# Patient Record
Sex: Female | Born: 1950 | Race: White | Hispanic: No | Marital: Married | Smoking: Never smoker
Health system: Southern US, Community
[De-identification: ages and names within clinical notes are randomized; demographics above are authoritative.]

## PROBLEM LIST (undated history)

## (undated) DIAGNOSIS — E785 Hyperlipidemia, unspecified: Secondary | ICD-10-CM

## (undated) DIAGNOSIS — F32A Depression, unspecified: Secondary | ICD-10-CM

## (undated) DIAGNOSIS — N1330 Unspecified hydronephrosis: Secondary | ICD-10-CM

## (undated) DIAGNOSIS — F329 Major depressive disorder, single episode, unspecified: Secondary | ICD-10-CM

## (undated) DIAGNOSIS — K219 Gastro-esophageal reflux disease without esophagitis: Secondary | ICD-10-CM

## (undated) DIAGNOSIS — R51 Headache: Secondary | ICD-10-CM

## (undated) DIAGNOSIS — N3941 Urge incontinence: Secondary | ICD-10-CM

## (undated) DIAGNOSIS — Z973 Presence of spectacles and contact lenses: Secondary | ICD-10-CM

## (undated) DIAGNOSIS — N200 Calculus of kidney: Secondary | ICD-10-CM

## (undated) DIAGNOSIS — E119 Type 2 diabetes mellitus without complications: Secondary | ICD-10-CM

## (undated) DIAGNOSIS — Z8679 Personal history of other diseases of the circulatory system: Secondary | ICD-10-CM

## (undated) DIAGNOSIS — Z87442 Personal history of urinary calculi: Secondary | ICD-10-CM

## (undated) DIAGNOSIS — F0390 Unspecified dementia without behavioral disturbance: Secondary | ICD-10-CM

## (undated) DIAGNOSIS — M1990 Unspecified osteoarthritis, unspecified site: Secondary | ICD-10-CM

## (undated) HISTORY — PX: OTHER SURGICAL HISTORY: SHX169

---

## 1998-04-15 ENCOUNTER — Other Ambulatory Visit: Admission: RE | Admit: 1998-04-15 | Discharge: 1998-04-15 | Payer: Self-pay | Admitting: Gynecology

## 1999-07-05 ENCOUNTER — Other Ambulatory Visit: Admission: RE | Admit: 1999-07-05 | Discharge: 1999-07-05 | Payer: Self-pay | Admitting: Gynecology

## 2000-09-03 ENCOUNTER — Other Ambulatory Visit: Admission: RE | Admit: 2000-09-03 | Discharge: 2000-09-03 | Payer: Self-pay | Admitting: Gynecology

## 2001-10-02 ENCOUNTER — Other Ambulatory Visit: Admission: RE | Admit: 2001-10-02 | Discharge: 2001-10-02 | Payer: Self-pay | Admitting: Gynecology

## 2002-12-29 ENCOUNTER — Other Ambulatory Visit: Admission: RE | Admit: 2002-12-29 | Discharge: 2002-12-29 | Payer: Self-pay | Admitting: Gynecology

## 2004-01-19 ENCOUNTER — Other Ambulatory Visit: Admission: RE | Admit: 2004-01-19 | Discharge: 2004-01-19 | Payer: Self-pay | Admitting: Gynecology

## 2005-08-17 ENCOUNTER — Other Ambulatory Visit: Admission: RE | Admit: 2005-08-17 | Discharge: 2005-08-17 | Payer: Self-pay | Admitting: Gynecology

## 2008-10-28 ENCOUNTER — Ambulatory Visit (HOSPITAL_BASED_OUTPATIENT_CLINIC_OR_DEPARTMENT_OTHER): Admission: RE | Admit: 2008-10-28 | Discharge: 2008-10-28 | Payer: Self-pay | Admitting: Urology

## 2008-10-28 HISTORY — PX: OTHER SURGICAL HISTORY: SHX169

## 2009-10-12 ENCOUNTER — Encounter: Admission: RE | Admit: 2009-10-12 | Discharge: 2009-10-12 | Payer: Self-pay | Admitting: Preventative Medicine

## 2009-10-26 ENCOUNTER — Encounter: Admission: RE | Admit: 2009-10-26 | Discharge: 2009-10-26 | Payer: Self-pay | Admitting: Preventative Medicine

## 2010-09-30 ENCOUNTER — Ambulatory Visit (HOSPITAL_BASED_OUTPATIENT_CLINIC_OR_DEPARTMENT_OTHER)
Admission: RE | Admit: 2010-09-30 | Discharge: 2010-09-30 | Disposition: A | Discharge status: Home / Self Care | Payer: BC Managed Care – PPO | Attending: Urology | Admitting: Urology

## 2010-09-30 DIAGNOSIS — Z01812 Encounter for preprocedural laboratory examination: Secondary | ICD-10-CM | POA: Insufficient documentation

## 2010-09-30 DIAGNOSIS — N1330 Unspecified hydronephrosis: Secondary | ICD-10-CM | POA: Insufficient documentation

## 2010-09-30 DIAGNOSIS — N201 Calculus of ureter: Secondary | ICD-10-CM | POA: Insufficient documentation

## 2010-09-30 DIAGNOSIS — N135 Crossing vessel and stricture of ureter without hydronephrosis: Secondary | ICD-10-CM | POA: Insufficient documentation

## 2010-09-30 DIAGNOSIS — Z01810 Encounter for preprocedural cardiovascular examination: Secondary | ICD-10-CM | POA: Insufficient documentation

## 2010-09-30 LAB — POCT I-STAT, CHEM 8
BUN: 12 mg/dL (ref 6–23)
Creatinine, Ser: 0.8 mg/dL (ref 0.4–1.2)
Glucose, Bld: 123 mg/dL — ABNORMAL HIGH (ref 70–99)
HCT: 42 % (ref 36.0–46.0)
Hemoglobin: 14.3 g/dL (ref 12.0–15.0)
Sodium: 141 mEq/L (ref 135–145)
TCO2: 22 mmol/L (ref 0–100)

## 2010-10-04 NOTE — Op Note (Addendum)
  NAMEJESSICCA, [name] NO.:  [id number]  MEDICAL RECORD NO.:  [id number]          PATIENT TYPE:  AMB  LOCATION:  [facility]                         FACILITY:  [facility]  PHYSICIAN:  Maretta Bees. Vonita Moss, M.D.DATE OF BIRTH:  1950-11-28  DATE OF PROCEDURE:  09/30/2010 DATE OF DISCHARGE:                              OPERATIVE REPORT   PREOPERATIVE DIAGNOSES:  Right ureteral stones, right ureteral stricture, and marked right hydronephrosis.  POSTOPERATIVE DIAGNOSES:  Right ureteral stones, right ureteral stricture, and marked right hydronephrosis.  PROCEDURE: 1. Cystoscopy. 2. Right ureteral dilation. 3. Right ureteroscopy. 4. Laser fragmentation of stones. 5. Right retrograde pyelogram with interpretation. 6. Right double-J catheter placement.  SURGEON:  Maretta Bees. Vonita Moss, M.D.  ANESTHESIA:  General.  INDICATIONS:  This lady has had a long history of stone disease and has had previous ureteroscopic and endoscopic therapy for stones.  She is brought to OR today because CT scan found when she had grossly bloody urine demonstrated significant hydronephrosis and 3 mm to 5 mm stone in the bony pelvic region just above the hip joint.  When I first saw her in 2009, diagnosed a right ureteral stricture.  This has complicated her past stone disease.  DESCRIPTION OF PROCEDURE:  The patient was brought to the operating room and placed in lithotomy position.  External genitalia prepped and draped in usual fashion.  She was cystoscoped and the bladder was unremarkable. A sensor guidewire easily traversed the right ureter to the renal collecting system.  I then inserted a 6-French rigid ureteroscope and there was a true anatomic stricture just below the stone that required dilation with the ureteral dilating access sheath.  I was able to reach the stone and utilizing the holmium laser fragmented the stone into several tiny pieces and also broke up the 2 tiny stones that were  noted above the obstructing stone on CT scan.  The fragments were too small to collect and basket.  I ureteroscoped the proximal ureter and saw no other obvious stones.  At this point through the ureteroscope, I injected contrast and demonstrated a markedly dilated pyelocaliceal system.  I then measured the ureteral length with an open-ended ureteral catheter and elected to insert a 6-French 28-cm contour double-J catheter with a full coil in the mid calix and full coil in the bladder.  The stent was left in without a string.  At this point, the bladder was emptied and the scope removed.  There did not appear to be any significant bleeding and she tolerated the procedure well and was taken to recovery room in good condition.     Maretta Bees. Vonita Moss, M.D.     LJP/MEDQ  D:  09/30/2010  T:  09/30/2010  Job:  [id number]  cc:   Randye Lobo, M.D. Fax: [phone]  Electronically Signed by Larey Dresser M.D. on 10/04/2010 04:53:38 PM

## 2010-11-11 ENCOUNTER — Other Ambulatory Visit (HOSPITAL_COMMUNITY): Payer: Self-pay | Admitting: Urology

## 2010-11-11 DIAGNOSIS — N1330 Unspecified hydronephrosis: Secondary | ICD-10-CM

## 2010-11-15 ENCOUNTER — Encounter (HOSPITAL_COMMUNITY): Payer: Self-pay

## 2010-11-15 ENCOUNTER — Encounter (HOSPITAL_COMMUNITY)
Admission: RE | Admit: 2010-11-15 | Discharge: 2010-11-15 | Disposition: A | Discharge status: Home / Self Care | Payer: BC Managed Care – PPO | Source: Ambulatory Visit | Attending: Urology | Admitting: Urology

## 2010-11-15 DIAGNOSIS — N1330 Unspecified hydronephrosis: Secondary | ICD-10-CM | POA: Insufficient documentation

## 2010-11-15 MED ORDER — FUROSEMIDE 10 MG/ML IJ SOLN: 42.5000 mg | Freq: Once | INTRAMUSCULAR | Status: DC

## 2010-11-15 MED ORDER — TECHNETIUM TC 99M MERTIATIDE
14.3000 | Freq: Once | INTRAVENOUS | Status: AC | PRN
  Administered 2010-11-15: 14.3 via INTRAVENOUS

## 2010-11-29 ENCOUNTER — Ambulatory Visit (HOSPITAL_BASED_OUTPATIENT_CLINIC_OR_DEPARTMENT_OTHER)
Admission: RE | Admit: 2010-11-29 | Discharge: 2010-11-29 | Disposition: A | Discharge status: Home / Self Care | Payer: BC Managed Care – PPO | Source: Ambulatory Visit | Attending: Urology | Admitting: Urology

## 2010-11-29 DIAGNOSIS — N1330 Unspecified hydronephrosis: Secondary | ICD-10-CM | POA: Insufficient documentation

## 2010-11-29 DIAGNOSIS — N201 Calculus of ureter: Secondary | ICD-10-CM | POA: Insufficient documentation

## 2010-11-29 DIAGNOSIS — E119 Type 2 diabetes mellitus without complications: Secondary | ICD-10-CM | POA: Insufficient documentation

## 2010-11-29 DIAGNOSIS — Z79899 Other long term (current) drug therapy: Secondary | ICD-10-CM | POA: Insufficient documentation

## 2010-11-29 DIAGNOSIS — N135 Crossing vessel and stricture of ureter without hydronephrosis: Secondary | ICD-10-CM | POA: Insufficient documentation

## 2010-11-29 DIAGNOSIS — K219 Gastro-esophageal reflux disease without esophagitis: Secondary | ICD-10-CM | POA: Insufficient documentation

## 2010-11-29 DIAGNOSIS — Z01812 Encounter for preprocedural laboratory examination: Secondary | ICD-10-CM | POA: Insufficient documentation

## 2010-11-29 DIAGNOSIS — E7800 Pure hypercholesterolemia, unspecified: Secondary | ICD-10-CM | POA: Insufficient documentation

## 2010-11-29 HISTORY — PX: OTHER SURGICAL HISTORY: SHX169

## 2010-11-29 LAB — POCT I-STAT 4, (NA,K, GLUC, HGB,HCT)
HCT: 40 % (ref 36.0–46.0)
Sodium: 143 mEq/L (ref 135–145)

## 2010-12-07 NOTE — Op Note (Signed)
[name], DAUS NO.:  [id number]  MEDICAL RECORD NO.:  [id number]          PATIENT TYPE:  LOCATION:                                 FACILITY:  PHYSICIAN:  Maretta Bees. Vonita Moss, M.D.DATE OF BIRTH:  February 11, 1951  DATE OF PROCEDURE:  11/29/2010 DATE OF DISCHARGE:                              OPERATIVE REPORT   PREOPERATIVE DIAGNOSES:  Right ureteral stricture, right hydronephrosis, and rule out right renal stones.  POSTOPERATIVE DIAGNOSES:  Right ureteral stricture, right ureteral stone, and right hydronephrosis.  PROCEDURES:  Cystoscopy, right ureteroscopy with stone basketing, balloon dilation of right ureteral stricture, and placement of right double-J catheter.  SURGEON:  Maretta Bees. Vonita Moss, M.D.  ANESTHESIA:  General.  INDICATIONS:  This 60 year old lady has had long history of right ureteral stone disease.  She had a several month period from July 2010 until the next year where she had what was probably silent obstruction from 5 mm stone.  She has undergone prior ureteroscopies and stent placements.  She comes to the OR now with marked right hydronephrosis and question of residual stones.  She was advised about  risk of recurrent stricture and the need for lengthy double-J catheter drainage.  DESCRIPTION OF PROCEDURE:  The patient was brought to the operating room and placed in lithotomy position.  The external genitalia prepped and draped in usual fashion.  She was cystoscoped and the bladder was unremarkable.  She had some trigonitis.  I inserted a retractable core guidewire up the right ureter and it will only go about 3 cm.  I then injected contrast and she had a normal distal right ureter.  No contrast was seen to go above the 3 cm level.  I then inserted a 6- French rigid ureteroscope and she had a very pin-point opening in the ureter.  I then used the Glidewire and fortunately was able to manipulate it up into the kidney.  I then inserted  an open-ended ureteral catheter that obviously went through a tight strictured area in the lower ureter before it entered the pyelocaliceal system.  At this time, I injected contrast and documented the marked pyelocaliceal dilation.  I then inserted the rigid short 6-French ureteroscope and just above the stricture I visualized the stone which I was able to basket, though it was somewhat imbedded.  I retrieved the stone intact. After that, I serially dilated the lower ureter using the 4 cm long 10- French balloon dilating catheter.  I then inserted the rigid ureteroscope and was easily able to traverse the strictured area which started about 3 cm above the left ureteral orifice and probably was 5 cm long.  Above this, there was a dilated but normal looking ureter.  I got all the way up to the kidney with the rigid scope and then with another guidewire in place, inserted ureteral access sheath.  I then used the digital scope to visualize the calyces, the kidney, and saw no other stones.  At this point, I inserted a 6-French 28-cm Contura catheter with full curl in the renal pelvis and full curl in the bladder.  String was removed.  The bladder was emptied, scope removed, and the patient sent to recovery room in good condition, having tolerated the procedure well.     Maretta Bees. Vonita Moss, M.D.     LJP/MEDQ  D:  11/29/2010  T:  11/29/2010  Job:  [id number]  Electronically Signed by Larey Dresser M.D. on 12/07/2010 11:06:12 AM

## 2010-12-08 LAB — POCT I-STAT, CHEM 8
BUN: 8 mg/dL (ref 6–23)
Chloride: 108 mEq/L (ref 96–112)
Glucose, Bld: 109 mg/dL — ABNORMAL HIGH (ref 70–99)

## 2011-01-10 NOTE — Op Note (Signed)
NAMEJOSCELYNE, RENVILLE NO.:  [id number]   MEDICAL RECORD NO.:  [id number]          PATIENT TYPE:  AMB   LOCATION:  [facility]                         FACILITY:  [facility]   PHYSICIAN:  Maretta Bees. Vonita Moss, M.D.DATE OF BIRTH:  [birth year]   DATE OF PROCEDURE:  10/28/2008  DATE OF DISCHARGE:                               OPERATIVE REPORT   PREOPERATIVE DIAGNOSIS:  Distal right ureteral stone.   POSTOPERATIVE DIAGNOSES:  Distal right ureteral stone plus right  hydronephrosis and right ureteral stricture.   PROCEDURE:  Cystoscopy, right ureteroscopy, dilation of right ureteral  stricture, right retrograde pyelogram with interpretation, and insertion  of right double-J catheter.   SURGEON:  Dr. Larey Dresser   ANESTHESIA:  General.   INDICATIONS:  This lady has had the onset of right flank pain in  December due to two ureteral stones and she underwent cystoscopy and  ureteroscopy and laser therapy at [facility] in December.  She  has had some retained stones and in mid-January a CT scan showed some  persistent mid-ureteral calculi.  She is still having intermittent right  flank pain.  KUB in the office the other day showed what appeared to be  a stone in the distal right ureter.  She is brought the OR today for  further evaluation and treatment.   PROCEDURE:  The patient was brought to the operating room and placed in  lithotomy position.  External genitalia were prepped and draped in usual  fashion.  She was cystoscoped and the only abnormality was an edematous  right ureteral orifice.  I saw no stone in the bladder.  I then passed a  retractable core guidewire with the core retracted up the right ureter  without any difficulty.  I then used the short 6-French rigid  ureteroscope and scoped the distal ureter and I saw no stone, but there  was an impassable stricture just below the pelvic brim.  I then used the  inner core of a ureteral access sheath and encountered  some resistance  at this level of the stricture until the inner core slid through and  this was left in place for 2 minutes.  After that, with the guidewire  still in place, I inserted the 6-French rigid scope and easily traversed  this dilated stricture and was able to scope the ureter all the way up  to the renal pelvis and I did not see any stone.  It is unclear whether  she had passed the stone and did not notice it or whether I may have  flushed the stone back up into the renal pelvis and it was not  observable at this time.   Looking back down the ureter, there was some mild bleeding at the site  of the dilation of the stricture, but there was no evidence of ureteral  disruption or undue trauma.   With the guidewire in place that was inserted cystoscopically, I  inserted a 5-French open-ended ureteral catheter and performed a right  retrograde pyelogram that showed moderate pyelocaliectasis.   In view of the need to dilate the  strictured area in the distal ureter  and the moderate hydronephrosis, I thought it best to leave up a double-  J catheter and actually I selected a 6-French 28-cm Polaris.  This was  inserted without difficulty  with a full coil in the renal collecting system and the multiple loops  brought out per the right ureteral orifice.  The string had been  previously removed.  At this point, the bladder was re-scoped and  emptied and the patient sent to recovery room in good condition.      Maretta Bees. Vonita Moss, M.D.  Electronically Signed     LJP/MEDQ  D:  10/28/2008  T:  10/28/2008  Job:  [id number]

## 2011-02-20 ENCOUNTER — Other Ambulatory Visit (HOSPITAL_COMMUNITY): Payer: Self-pay | Admitting: Urology

## 2011-02-20 DIAGNOSIS — N1330 Unspecified hydronephrosis: Secondary | ICD-10-CM

## 2011-02-21 ENCOUNTER — Other Ambulatory Visit (HOSPITAL_COMMUNITY): Payer: BC Managed Care – PPO

## 2011-02-24 ENCOUNTER — Encounter (HOSPITAL_COMMUNITY): Payer: Self-pay

## 2011-02-24 ENCOUNTER — Encounter (HOSPITAL_COMMUNITY)
Admission: RE | Admit: 2011-02-24 | Discharge: 2011-02-24 | Disposition: A | Discharge status: Home / Self Care | Payer: BC Managed Care – PPO | Source: Ambulatory Visit | Attending: Urology | Admitting: Urology

## 2011-02-24 DIAGNOSIS — N1330 Unspecified hydronephrosis: Secondary | ICD-10-CM | POA: Insufficient documentation

## 2011-02-24 DIAGNOSIS — N289 Disorder of kidney and ureter, unspecified: Secondary | ICD-10-CM | POA: Insufficient documentation

## 2011-02-24 MED ORDER — TECHNETIUM TC 99M MERTIATIDE
15.8000 | Freq: Once | INTRAVENOUS | Status: AC | PRN
  Administered 2011-02-24: 15.8 via INTRAVENOUS

## 2011-04-04 ENCOUNTER — Ambulatory Visit (INDEPENDENT_AMBULATORY_CARE_PROVIDER_SITE_OTHER): Payer: BC Managed Care – PPO | Admitting: Urology

## 2011-04-04 DIAGNOSIS — N135 Crossing vessel and stricture of ureter without hydronephrosis: Secondary | ICD-10-CM

## 2011-04-04 DIAGNOSIS — R310 Gross hematuria: Secondary | ICD-10-CM

## 2011-04-04 DIAGNOSIS — N200 Calculus of kidney: Secondary | ICD-10-CM

## 2011-04-05 ENCOUNTER — Ambulatory Visit (HOSPITAL_COMMUNITY)
Admission: RE | Admit: 2011-04-05 | Discharge: 2011-04-05 | Disposition: A | Discharge status: Home / Self Care | Payer: BC Managed Care – PPO | Source: Ambulatory Visit | Attending: Urology | Admitting: Urology

## 2011-04-05 ENCOUNTER — Other Ambulatory Visit: Payer: Self-pay | Admitting: Urology

## 2011-04-05 DIAGNOSIS — R109 Unspecified abdominal pain: Secondary | ICD-10-CM | POA: Insufficient documentation

## 2011-04-05 DIAGNOSIS — N200 Calculus of kidney: Secondary | ICD-10-CM | POA: Insufficient documentation

## 2011-04-17 ENCOUNTER — Ambulatory Visit (HOSPITAL_COMMUNITY)
Admission: RE | Admit: 2011-04-17 | Discharge: 2011-04-17 | Disposition: A | Discharge status: Home / Self Care | Payer: BC Managed Care – PPO | Source: Ambulatory Visit | Attending: Urology | Admitting: Urology

## 2011-04-17 DIAGNOSIS — E119 Type 2 diabetes mellitus without complications: Secondary | ICD-10-CM | POA: Insufficient documentation

## 2011-04-17 DIAGNOSIS — I10 Essential (primary) hypertension: Secondary | ICD-10-CM | POA: Insufficient documentation

## 2011-04-17 DIAGNOSIS — K219 Gastro-esophageal reflux disease without esophagitis: Secondary | ICD-10-CM | POA: Insufficient documentation

## 2011-04-17 DIAGNOSIS — K449 Diaphragmatic hernia without obstruction or gangrene: Secondary | ICD-10-CM | POA: Insufficient documentation

## 2011-04-17 DIAGNOSIS — E7800 Pure hypercholesterolemia, unspecified: Secondary | ICD-10-CM | POA: Insufficient documentation

## 2011-04-17 DIAGNOSIS — N200 Calculus of kidney: Secondary | ICD-10-CM | POA: Insufficient documentation

## 2011-04-17 HISTORY — PX: EXTRACORPOREAL SHOCK WAVE LITHOTRIPSY: SHX1557

## 2011-04-17 LAB — GLUCOSE, CAPILLARY: Glucose-Capillary: 121 mg/dL — ABNORMAL HIGH (ref 70–99)

## 2011-05-08 ENCOUNTER — Ambulatory Visit (HOSPITAL_COMMUNITY)
Admission: RE | Admit: 2011-05-08 | Discharge: 2011-05-08 | Disposition: A | Discharge status: Home / Self Care | Payer: BC Managed Care – PPO | Source: Ambulatory Visit | Attending: Urology | Admitting: Urology

## 2011-05-08 ENCOUNTER — Other Ambulatory Visit: Payer: Self-pay | Admitting: Urology

## 2011-05-08 DIAGNOSIS — N200 Calculus of kidney: Secondary | ICD-10-CM

## 2011-05-08 DIAGNOSIS — Z09 Encounter for follow-up examination after completed treatment for conditions other than malignant neoplasm: Secondary | ICD-10-CM | POA: Insufficient documentation

## 2011-05-09 ENCOUNTER — Ambulatory Visit: Payer: BC Managed Care – PPO | Admitting: Urology

## 2011-06-06 ENCOUNTER — Other Ambulatory Visit: Payer: Self-pay | Admitting: Obstetrics and Gynecology

## 2011-07-02 ENCOUNTER — Encounter (HOSPITAL_COMMUNITY): Payer: Self-pay

## 2011-07-05 NOTE — Patient Instructions (Addendum)
   Your procedure is scheduled WU:[id number] November 15th  Enter through the Main Entrance of [facility] at:6am Pick up the phone at the desk and dial [phone] and inform us of your arrival.  Please call this number if you have any problems the morning of surgery: [phone]  Remember: Do not eat food after midnight:Wednesday Do not drink clear liquids after:midnight Wednesday Take these medicines the morning of surgery with a SIP OF WATER:hold metformin morning of surgery, please take your nexium, effexor, and b/p medicine morning of surgery with sips of water  Do not wear jewelry, make-up, or FINGER nail polish Do not wear lotions, powders, or perfumes.  You may not  wear deodorant. Do not shave 48 hours prior to surgery. Do not bring valuables to the hospital.  Leave suitcase in the car. After Surgery it may be brought to your room. For patients being admitted to the hospital, checkout time is 11:00am the day of discharge.     Remember to use your hibiclens as instructed.Please shower with 1/2 bottle the evening before your surgery and the other 1/2 bottle the morning of surgery.

## 2011-07-06 ENCOUNTER — Encounter (HOSPITAL_COMMUNITY): Payer: Self-pay

## 2011-07-06 ENCOUNTER — Encounter (HOSPITAL_COMMUNITY)
Admission: RE | Admit: 2011-07-06 | Discharge: 2011-07-06 | Disposition: A | Discharge status: Home / Self Care | Payer: BC Managed Care – PPO | Source: Ambulatory Visit | Attending: Obstetrics and Gynecology | Admitting: Obstetrics and Gynecology

## 2011-07-06 HISTORY — DX: Headache: R51

## 2011-07-06 HISTORY — DX: Gastro-esophageal reflux disease without esophagitis: K21.9

## 2011-07-06 HISTORY — DX: Unspecified osteoarthritis, unspecified site: M19.90

## 2011-07-06 HISTORY — DX: Major depressive disorder, single episode, unspecified: F32.9

## 2011-07-06 HISTORY — DX: Hyperlipidemia, unspecified: E78.5

## 2011-07-06 HISTORY — DX: Depression, unspecified: F32.A

## 2011-07-06 LAB — COMPREHENSIVE METABOLIC PANEL
BUN: 8 mg/dL (ref 6–23)
Calcium: 10.1 mg/dL (ref 8.4–10.5)
GFR calc Af Amer: >90 mL/min (ref >90)
Glucose, Bld: 94 mg/dL (ref 70–99)
Total Protein: 7.1 g/dL (ref 6.0–8.3)

## 2011-07-06 LAB — URINALYSIS, ROUTINE W REFLEX MICROSCOPIC
Ketones, ur: NEGATIVE mg/dL
Leukocytes, UA: NEGATIVE
Nitrite: NEGATIVE
Specific Gravity, Urine: >1.03 — ABNORMAL HIGH (ref 1.005–1.030)
pH: 6 (ref 5.0–8.0)

## 2011-07-06 LAB — CBC
HCT: 40 % (ref 36.0–46.0)
Hemoglobin: 12.9 g/dL (ref 12.0–15.0)
MCH: 27.2 pg (ref 26.0–34.0)
MCHC: 32.3 g/dL (ref 30.0–36.0)

## 2011-07-06 LAB — SURGICAL PCR SCREEN: MRSA, PCR: NEGATIVE

## 2011-07-06 NOTE — Pre-Procedure Instructions (Addendum)
EKG from pt primary care Dr Carlynn Spry [facility]-SR normal limits-on chart

## 2011-07-12 MED ORDER — DEXTROSE 5 % IV SOLN
2.0000 g | INTRAVENOUS | Status: AC
  Administered 2011-07-13: 2 g via INTRAVENOUS
  Filled 2011-07-12: qty 2

## 2011-07-13 ENCOUNTER — Ambulatory Visit (HOSPITAL_COMMUNITY): Payer: BC Managed Care – PPO | Admitting: Anesthesiology

## 2011-07-13 ENCOUNTER — Ambulatory Visit (HOSPITAL_COMMUNITY)
Admission: RE | Admit: 2011-07-13 | Discharge: 2011-07-14 | DRG: 356 | Disposition: A | Discharge status: Home / Self Care | Payer: BC Managed Care – PPO | Source: Ambulatory Visit | Attending: Obstetrics and Gynecology | Admitting: Obstetrics and Gynecology

## 2011-07-13 ENCOUNTER — Encounter (HOSPITAL_COMMUNITY)
Admission: RE | Disposition: A | Discharge status: Home / Self Care | Payer: Self-pay | Source: Ambulatory Visit | Attending: Obstetrics and Gynecology

## 2011-07-13 ENCOUNTER — Encounter (HOSPITAL_COMMUNITY): Payer: Self-pay | Admitting: Anesthesiology

## 2011-07-13 ENCOUNTER — Other Ambulatory Visit: Payer: Self-pay | Admitting: Obstetrics and Gynecology

## 2011-07-13 ENCOUNTER — Encounter (HOSPITAL_COMMUNITY): Payer: Self-pay | Admitting: *Deleted

## 2011-07-13 DIAGNOSIS — D251 Intramural leiomyoma of uterus: Secondary | ICD-10-CM | POA: Diagnosis present

## 2011-07-13 DIAGNOSIS — I10 Essential (primary) hypertension: Secondary | ICD-10-CM | POA: Diagnosis present

## 2011-07-13 DIAGNOSIS — E119 Type 2 diabetes mellitus without complications: Secondary | ICD-10-CM | POA: Diagnosis present

## 2011-07-13 DIAGNOSIS — N812 Incomplete uterovaginal prolapse: Secondary | ICD-10-CM | POA: Diagnosis present

## 2011-07-13 DIAGNOSIS — N393 Stress incontinence (female) (male): Secondary | ICD-10-CM | POA: Diagnosis present

## 2011-07-13 DIAGNOSIS — N8000 Endometriosis of the uterus, unspecified: Secondary | ICD-10-CM | POA: Diagnosis present

## 2011-07-13 HISTORY — PX: LAPAROSCOPIC ASSISTED VAGINAL HYSTERECTOMY: SHX5398

## 2011-07-13 HISTORY — PX: CYSTOSCOPY: SHX5120

## 2011-07-13 HISTORY — PX: SALPINGOOPHORECTOMY: SHX82

## 2011-07-13 HISTORY — PX: BLADDER SUSPENSION: SHX72

## 2011-07-13 HISTORY — PX: ANTERIOR AND POSTERIOR REPAIR: SHX5121

## 2011-07-13 LAB — GLUCOSE, CAPILLARY: Glucose-Capillary: 132 mg/dL — ABNORMAL HIGH (ref 70–99)

## 2011-07-13 SURGERY — HYSTERECTOMY, VAGINAL, LAPAROSCOPY-ASSISTED
Anesthesia: Choice | Site: Bladder

## 2011-07-13 MED ORDER — BUPIVACAINE HCL (PF) 0.25 % IJ SOLN
INTRAMUSCULAR | Status: DC | PRN
  Administered 2011-07-13: 10 mL

## 2011-07-13 MED ORDER — MENTHOL 3 MG MT LOZG: 1.0000 | LOZENGE | OROMUCOSAL | Status: DC | PRN

## 2011-07-13 MED ORDER — STERILE WATER FOR IRRIGATION IR SOLN
Status: DC | PRN
  Administered 2011-07-13: 1000 mL via INTRAVESICAL

## 2011-07-13 MED ORDER — INDIGOTINDISULFONATE SODIUM 8 MG/ML IJ SOLN
INTRAMUSCULAR | Status: DC | PRN
  Administered 2011-07-13: 5 mL via INTRAVENOUS

## 2011-07-13 MED ORDER — NEOSTIGMINE METHYLSULFATE 1 MG/ML IJ SOLN
INTRAMUSCULAR | Status: DC | PRN
  Administered 2011-07-13: 3 mg via INTRAVENOUS

## 2011-07-13 MED ORDER — ROCURONIUM BROMIDE 50 MG/5ML IV SOLN
INTRAVENOUS | Status: AC
  Filled 2011-07-13: qty 1

## 2011-07-13 MED ORDER — NALOXONE HCL 0.4 MG/ML IJ SOLN: 0.4000 mg | INTRAMUSCULAR | Status: DC | PRN

## 2011-07-13 MED ORDER — HYDROMORPHONE HCL PF 1 MG/ML IJ SOLN: 0.2500 mg | INTRAMUSCULAR | Status: DC | PRN

## 2011-07-13 MED ORDER — ONDANSETRON HCL 4 MG/2ML IJ SOLN
INTRAMUSCULAR | Status: DC | PRN
  Administered 2011-07-13: 4 mg via INTRAVENOUS

## 2011-07-13 MED ORDER — SODIUM CHLORIDE 0.9 % IJ SOLN: 9.0000 mL | INTRAMUSCULAR | Status: DC | PRN

## 2011-07-13 MED ORDER — DIPHENHYDRAMINE HCL 50 MG/ML IJ SOLN: 12.5000 mg | Freq: Four times a day (QID) | INTRAMUSCULAR | Status: DC | PRN

## 2011-07-13 MED ORDER — METOCLOPRAMIDE HCL 5 MG/ML IJ SOLN
10.0000 mg | Freq: Once | INTRAMUSCULAR | Status: AC
  Administered 2011-07-13: 10 mg via INTRAVENOUS

## 2011-07-13 MED ORDER — LIDOCAINE HCL (CARDIAC) 20 MG/ML IV SOLN
INTRAVENOUS | Status: DC | PRN
  Administered 2011-07-13: 80 mg via INTRAVENOUS

## 2011-07-13 MED ORDER — MORPHINE SULFATE (PF) 1 MG/ML IV SOLN
INTRAVENOUS | Status: DC
  Administered 2011-07-13: 7 mg via INTRAVENOUS
  Administered 2011-07-13: 15:00:00 via INTRAVENOUS
  Administered 2011-07-13: 4 mL via INTRAVENOUS
  Administered 2011-07-14: 2 mg via INTRAVENOUS
  Administered 2011-07-14: 1 mg via INTRAVENOUS
  Filled 2011-07-13: qty 25

## 2011-07-13 MED ORDER — ONDANSETRON HCL 4 MG/2ML IJ SOLN
INTRAMUSCULAR | Status: AC
  Filled 2011-07-13: qty 2

## 2011-07-13 MED ORDER — GLYCOPYRROLATE 0.2 MG/ML IJ SOLN
INTRAMUSCULAR | Status: DC | PRN
  Administered 2011-07-13: .6 mg via INTRAVENOUS

## 2011-07-13 MED ORDER — HYDROMORPHONE HCL PF 1 MG/ML IJ SOLN
INTRAMUSCULAR | Status: AC
  Filled 2011-07-13: qty 1

## 2011-07-13 MED ORDER — METOCLOPRAMIDE HCL 5 MG/ML IJ SOLN
INTRAMUSCULAR | Status: AC
  Administered 2011-07-13: 10 mg via INTRAVENOUS
  Filled 2011-07-13: qty 2

## 2011-07-13 MED ORDER — ONDANSETRON HCL 4 MG/2ML IJ SOLN: 4.0000 mg | Freq: Four times a day (QID) | INTRAMUSCULAR | Status: DC | PRN

## 2011-07-13 MED ORDER — KETOROLAC TROMETHAMINE 30 MG/ML IJ SOLN
INTRAMUSCULAR | Status: AC
  Filled 2011-07-13: qty 1

## 2011-07-13 MED ORDER — IBUPROFEN 600 MG PO TABS
600.0000 mg | ORAL_TABLET | Freq: Four times a day (QID) | ORAL | Status: DC | PRN
  Administered 2011-07-14: 600 mg via ORAL
  Filled 2011-07-13: qty 1

## 2011-07-13 MED ORDER — MIDAZOLAM HCL 2 MG/2ML IJ SOLN
INTRAMUSCULAR | Status: AC
  Filled 2011-07-13: qty 2

## 2011-07-13 MED ORDER — ONDANSETRON HCL 4 MG/2ML IJ SOLN
4.0000 mg | Freq: Once | INTRAMUSCULAR | Status: AC
  Administered 2011-07-13: 4 mg via INTRAVENOUS

## 2011-07-13 MED ORDER — DOCUSATE SODIUM 100 MG PO CAPS
100.0000 mg | ORAL_CAPSULE | Freq: Every day | ORAL | Status: DC
  Administered 2011-07-14: 100 mg via ORAL
  Filled 2011-07-13 (×3): qty 1

## 2011-07-13 MED ORDER — PROPOFOL 10 MG/ML IV EMUL
INTRAVENOUS | Status: AC
  Filled 2011-07-13: qty 20

## 2011-07-13 MED ORDER — FENTANYL CITRATE 0.05 MG/ML IJ SOLN
INTRAMUSCULAR | Status: DC | PRN
  Administered 2011-07-13 (×3): 50 ug via INTRAVENOUS
  Administered 2011-07-13: 100 ug via INTRAVENOUS
  Administered 2011-07-13: 50 ug via INTRAVENOUS

## 2011-07-13 MED ORDER — FENTANYL CITRATE 0.05 MG/ML IJ SOLN
INTRAMUSCULAR | Status: AC
  Filled 2011-07-13: qty 5

## 2011-07-13 MED ORDER — KETOROLAC TROMETHAMINE 30 MG/ML IJ SOLN: 15.0000 mg | Freq: Once | INTRAMUSCULAR | Status: DC | PRN

## 2011-07-13 MED ORDER — HYDROMORPHONE HCL PF 1 MG/ML IJ SOLN
INTRAMUSCULAR | Status: DC | PRN
Start: 2011-07-13 — End: 2011-07-13
  Administered 2011-07-13: 1 mg via INTRAVENOUS

## 2011-07-13 MED ORDER — TEMAZEPAM 15 MG PO CAPS: 15.0000 mg | ORAL_CAPSULE | Freq: Every evening | ORAL | Status: DC | PRN

## 2011-07-13 MED ORDER — LACTATED RINGERS IV SOLN
INTRAVENOUS | Status: DC
  Administered 2011-07-13: 07:00:00 via INTRAVENOUS
  Administered 2011-07-13: 1000 mL via INTRAVENOUS
  Administered 2011-07-13 (×4): via INTRAVENOUS

## 2011-07-13 MED ORDER — FENTANYL CITRATE 0.05 MG/ML IJ SOLN
INTRAMUSCULAR | Status: AC
  Filled 2011-07-13: qty 2

## 2011-07-13 MED ORDER — LACTATED RINGERS IR SOLN
Status: DC | PRN
  Administered 2011-07-13: 3000 mL

## 2011-07-13 MED ORDER — DIPHENHYDRAMINE HCL 12.5 MG/5ML PO ELIX: 12.5000 mg | ORAL_SOLUTION | Freq: Four times a day (QID) | ORAL | Status: DC | PRN

## 2011-07-13 MED ORDER — KETOROLAC TROMETHAMINE 30 MG/ML IJ SOLN
INTRAMUSCULAR | Status: DC | PRN
  Administered 2011-07-13: 30 mg via INTRAVENOUS

## 2011-07-13 MED ORDER — LIDOCAINE-EPINEPHRINE 1 %-1:100000 IJ SOLN
INTRAMUSCULAR | Status: DC | PRN
  Administered 2011-07-13: 20 mL

## 2011-07-13 MED ORDER — PROPOFOL 10 MG/ML IV EMUL
INTRAVENOUS | Status: DC | PRN
  Administered 2011-07-13: 180 mg via INTRAVENOUS

## 2011-07-13 MED ORDER — MIDAZOLAM HCL 5 MG/5ML IJ SOLN
INTRAMUSCULAR | Status: DC | PRN
  Administered 2011-07-13: 2 mg via INTRAVENOUS

## 2011-07-13 MED ORDER — ROCURONIUM BROMIDE 100 MG/10ML IV SOLN
INTRAVENOUS | Status: DC | PRN
  Administered 2011-07-13: 5 mg via INTRAVENOUS
  Administered 2011-07-13: 45 mg via INTRAVENOUS
  Administered 2011-07-13 (×3): 10 mg via INTRAVENOUS

## 2011-07-13 MED ORDER — OXYCODONE-ACETAMINOPHEN 5-325 MG PO TABS: 1.0000 | ORAL_TABLET | ORAL | Status: DC | PRN

## 2011-07-13 MED ORDER — SIMETHICONE 80 MG PO CHEW: 80.0000 mg | CHEWABLE_TABLET | Freq: Four times a day (QID) | ORAL | Status: DC | PRN

## 2011-07-13 MED ORDER — LACTATED RINGERS IV SOLN
INTRAVENOUS | Status: DC
  Administered 2011-07-13 – 2011-07-14 (×3): via INTRAVENOUS

## 2011-07-13 MED ORDER — INSULIN ASPART 100 UNIT/ML ~~LOC~~ SOLN
0.0000 [IU] | SUBCUTANEOUS | Status: DC
  Administered 2011-07-13 – 2011-07-14 (×5): 1 [IU] via SUBCUTANEOUS
  Filled 2011-07-13: qty 3

## 2011-07-13 MED ORDER — GLYCOPYRROLATE 0.2 MG/ML IJ SOLN
INTRAMUSCULAR | Status: AC
  Filled 2011-07-13: qty 2

## 2011-07-13 MED ORDER — ESTRADIOL 0.1 MG/GM VA CREA
TOPICAL_CREAM | VAGINAL | Status: DC | PRN
  Administered 2011-07-13: 1 via VAGINAL

## 2011-07-13 MED ORDER — NEOSTIGMINE METHYLSULFATE 1 MG/ML IJ SOLN
INTRAMUSCULAR | Status: AC
  Filled 2011-07-13: qty 10

## 2011-07-13 MED ORDER — LIDOCAINE HCL (CARDIAC) 20 MG/ML IV SOLN
INTRAVENOUS | Status: AC
  Filled 2011-07-13: qty 5

## 2011-07-13 SURGICAL SUPPLY — 48 items
BAG URINE DRAINAGE (UROLOGICAL SUPPLIES) ×4 IMPLANT
BLADE SURG 15 STRL LF C SS BP (BLADE) ×3 IMPLANT
BLADE SURG 15 STRL SS (BLADE) ×1
CANISTER SUCTION 2500CC (MISCELLANEOUS) ×4 IMPLANT
CATH FOLEY 2WAY SLVR  5CC 18FR (CATHETERS) ×1
CATH FOLEY 2WAY SLVR 5CC 18FR (CATHETERS) ×3 IMPLANT
CLOTH BEACON ORANGE TIMEOUT ST (SAFETY) ×4 IMPLANT
CONT PATH 16OZ SNAP LID 3702 (MISCELLANEOUS) ×4 IMPLANT
COVER TABLE BACK 60X90 (DRAPES) ×4 IMPLANT
DECANTER SPIKE VIAL GLASS SM (MISCELLANEOUS) ×8 IMPLANT
DERMABOND ADVANCED (GAUZE/BANDAGES/DRESSINGS) ×1
DERMABOND ADVANCED .7 DNX12 (GAUZE/BANDAGES/DRESSINGS) ×3 IMPLANT
ELECT REM PT RETURN 9FT ADLT (ELECTROSURGICAL) ×4
ELECTRODE REM PT RTRN 9FT ADLT (ELECTROSURGICAL) ×3 IMPLANT
FILTER SMOKE EVAC LAPAROSHD (FILTER) ×4 IMPLANT
FORCEPS CUTTING 33CM 5MM (CUTTING FORCEPS) ×4 IMPLANT
GAUZE PACKING 2X5 YD STERILE (GAUZE/BANDAGES/DRESSINGS) ×4 IMPLANT
GAUZE SPONGE 4X4 16PLY XRAY LF (GAUZE/BANDAGES/DRESSINGS) ×8 IMPLANT
GLOVE BIO SURGEON STRL SZ 6.5 (GLOVE) ×16 IMPLANT
GLOVE BIOGEL PI IND STRL 6.5 (GLOVE) ×6 IMPLANT
GLOVE BIOGEL PI INDICATOR 6.5 (GLOVE) ×2
GOWN PREVENTION PLUS LG XLONG (DISPOSABLE) ×16 IMPLANT
NEEDLE HYPO 22GX1.5 SAFETY (NEEDLE) ×4 IMPLANT
NEEDLE MAYO 6 CRC TAPER PT (NEEDLE) IMPLANT
NS IRRIG 1000ML POUR BTL (IV SOLUTION) ×4 IMPLANT
PACK LAVH (CUSTOM PROCEDURE TRAY) ×4 IMPLANT
SCISSORS LAP 5X35 DISP (ENDOMECHANICALS) ×4 IMPLANT
SET CYSTO W/LG BORE CLAMP LF (SET/KITS/TRAYS/PACK) ×4 IMPLANT
SET IRRIG TUBING LAPAROSCOPIC (IRRIGATION / IRRIGATOR) ×4 IMPLANT
SLING TVT EXACT (Sling) ×4 IMPLANT
SPONGE SURGIFOAM ABS GEL 12-7 (HEMOSTASIS) IMPLANT
STRIP CLOSURE SKIN 1/4X3 (GAUZE/BANDAGES/DRESSINGS) IMPLANT
SURGIFLO TRUKIT (HEMOSTASIS) ×4 IMPLANT
SUT VIC AB 0 CT1 18XCR BRD8 (SUTURE) ×9 IMPLANT
SUT VIC AB 0 CT1 27 (SUTURE) ×3
SUT VIC AB 0 CT1 27XBRD ANBCTR (SUTURE) ×9 IMPLANT
SUT VIC AB 0 CT1 8-18 (SUTURE) ×3
SUT VIC AB 2-0 CT1 27 (SUTURE) ×1
SUT VIC AB 2-0 CT1 TAPERPNT 27 (SUTURE) ×3 IMPLANT
SUT VIC AB 2-0 CT2 27 (SUTURE) ×8 IMPLANT
SUT VIC AB 2-0 SH 27 (SUTURE) ×2
SUT VIC AB 2-0 SH 27XBRD (SUTURE) ×6 IMPLANT
SUT VIC AB 2-0 UR6 27 (SUTURE) ×8 IMPLANT
SUT VICRYL 0 TIES 12 18 (SUTURE) ×4 IMPLANT
SUT VICRYL 4-0 PS2 18IN ABS (SUTURE) ×4 IMPLANT
TOWEL OR 17X24 6PK STRL BLUE (TOWEL DISPOSABLE) ×12 IMPLANT
WARMER LAPAROSCOPE (MISCELLANEOUS) ×8 IMPLANT
WATER STERILE IRR 1000ML POUR (IV SOLUTION) ×4 IMPLANT

## 2011-07-13 NOTE — H&P (Signed)
[name], [name] NO.:  [id number]  MEDICAL RECORD NO.:  [id number]  LOCATION:                                 FACILITY:  PHYSICIAN:  Randye Lobo, Angela.D.   DATE OF BIRTH:  07-05-1951  DATE OF ADMISSION:  07/13/2011 DATE OF DISCHARGE:                             HISTORY & PHYSICAL   CHIEF COMPLAINT:  Urinary incontinence.  HISTORY OF PRESENT ILLNESS:  The patient is a 60 year old, gravida 0, Caucasian female, who presented in January 2012 reporting urinary incontinence with coughing, sneezing, or sudden movements.  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  She was noted to have a small cystocele, rectocele, and uterine prolapse.  Her urinalysis at that routine visit documented a large amount of red blood cells.  The patient reported a history of nephrolithiasis.  She was referred to Urology for further evaluation and care, the patient was diagnosed with bilateral nephrolithiasis.  She underwent a ureteroscopy with stone extraction, and balloon dilation of the right ureter, which contained a stricture.  She reports that she had lithotripsy on the left.  The patient has re-presented now in the month of October reporting worsening of her prolapse and desired to proceed with surgical treatment.  PAST OBSTETRIC AND GYNECOLOGIC HISTORY:  The patient is nulliparous. Her last menstrual period was in 2002.  The patient's last Pap smear was performed on May 29, 2011, and I do not see the results in her office chart.  Her prior Pap smear was performed on March 17, 2010 and was within normal limits.  Again, the last mammogram was performed on May 29, 2011 and again the reports are not available in the chart. Her previous mammogram performed on March 17, 2010 was within normal limits.  The patient does see Dr. Lodema Hong for her routine gynecologic care.  PAST MEDICAL  HISTORY: 1. Diabetes mellitus. 2. Hypertension. 3. Migraine headaches. 4. Gastroesophageal reflux disease. 5. Nephrolithiasis. 6. Degenerative joint disease of the back.  PAST SURGICAL HISTORY:  The patient has undergone multiple prior cystoscopies with insertion of ureteral stents, ureteroscopy and dilation of ureteral stricture along with lithotripsy.  MEDICATIONS: 1. Estradiol 1 mg p.o. daily. 2. Norethindrone 2.5 mg p.o. daily. 3. Metformin 500 mg p.o. daily. 4. Lipitor 20 mg p.o. daily. 5. Nexium 40 mg p.o. daily. 6. Metoprolol 50 mg p.o. daily. 7. Venlafaxine HCL 150 mg p.o. daily.  ALLERGIES:  SULFA and FLOMAX.  REVIEW OF SYSTEMS:  The patient has some constipation symptoms that she treats with a stool softener.  FAMILY HISTORY:  Negative for ovarian, uterine, colon, or breast cancer.  PHYSICAL EXAMINATION:  VITAL SIGNS:  Height 5 feet 4 inches, weight 183 pounds.  Blood pressure 118/78. GENERAL:  The patient is a middle-aged female, in no acute distress. LUNGS:  Clear to auscultation bilaterally. HEART:  S1 and S2 with a regular rate and rhythm. ABDOMEN:  Soft and nontender without evidence of hepatosplenomegaly or organomegaly. PELVIC:  Normal external genitalia and urethra.  The cervix and vagina demonstrate no lesions.  The cervix does appear to be stenotic.  There is a first-degree cystocele, first-degree uterine prolapse, and a first- degree rectocele.  No adnexal masses nor tenderness are appreciated.  IMPRESSION:  The patient is a 60 year old, gravida 26 Caucasian female, with incomplete uterovaginal prolapse, genuine stress incontinence, and a significant history of nephrolithiasis.  PLAN:  The patient will undergo a laparoscopically-assisted vaginal hysterectomy with bilateral salpingo-oophorectomy, anterior and posterior colporrhaphy, tension-free vaginal tape, mid urethral sling with cystoscopy on July 13, 2011 at the [facility]  [facility] [place].  Risks, benefits, alternatives to surgery, and surgical goals have been discussed with the patient, who wishes to proceed.     Randye Lobo, Angela.D.     BES/MEDQ  D:  07/12/2011  T:  07/12/2011  Job:  [id number]

## 2011-07-13 NOTE — Preoperative (Signed)
Beta Blockers   Reason not to administer Beta Blockers:Hold beta blocker due to other, patient took medication at home as directed

## 2011-07-13 NOTE — Anesthesia Postprocedure Evaluation (Signed)
Anesthesia Post Note  Patient: [name]  Procedure(s) Performed:  LAPAROSCOPIC ASSISTED VAGINAL HYSTERECTOMY; SALPINGO OOPHERECTOMY; ANTERIOR (CYSTOCELE) AND POSTERIOR REPAIR (RECTOCELE); TRANSVAGINAL TAPE (TVT) PROCEDURE; CYSTOSCOPY  Anesthesia type: General  Patient location: PACU  Post pain: Pain level controlled  Post assessment: Post-op Vital signs reviewed  Last Vitals:  Filed Vitals:   07/13/11 1206  BP:   Pulse: 84  Temp:   Resp: 12    Post vital signs: Reviewed  Level of consciousness: sedated  Complications: No apparent anesthesia complications

## 2011-07-13 NOTE — Transfer of Care (Signed)
Immediate Anesthesia Transfer of Care Note  Patient: [name]  Procedure(s) Performed:  LAPAROSCOPIC ASSISTED VAGINAL HYSTERECTOMY; SALPINGO OOPHERECTOMY; ANTERIOR (CYSTOCELE) AND POSTERIOR REPAIR (RECTOCELE); TRANSVAGINAL TAPE (TVT) PROCEDURE; CYSTOSCOPY  Patient Location: PACU  Anesthesia Type: General  Level of Consciousness: awake, alert , oriented and patient cooperative  Airway & Oxygen Therapy: Patient Spontanous Breathing and Patient connected to nasal cannula oxygen  Post-op Assessment: Report given to PACU RN and Post -op Vital signs reviewed and stable  Post vital signs: Reviewed and stable  Complications: No apparent anesthesia complications

## 2011-07-13 NOTE — Progress Notes (Signed)
Pre-op Visit  Vomited this am.  Patient examined.  OK to proceed with surgery.

## 2011-07-13 NOTE — Op Note (Signed)
[name], BITTMAN NO.:  [id number]  MEDICAL RECORD NO.:  [id number]  LOCATION:  9315                          FACILITY:  [facility]  PHYSICIAN:  Randye Lobo, M.D.   DATE OF BIRTH:  10/05/1950  DATE OF PROCEDURE:  07/13/2011 DATE OF DISCHARGE:                              OPERATIVE REPORT   PREOPERATIVE DIAGNOSIS:  Genuine stress incontinence, incomplete uterovaginal prolapse.  POSTOPERATIVE DIAGNOSIS:  Genuine stress incontinence, incomplete uterovaginal prolapse.  PROCEDURE:  Laparoscopically assisted vaginal hysterectomy with bilateral salpingo-oophorectomy, TVT Exact mid urethral sling, anterior colporrhaphy, perineorrhaphy, cystoscopy.  SURGEON:  Randye Lobo, MD  ASSISTANT:  Lodema Hong, MD  ANESTHESIA:  General endotracheal, local with 0.25% Marcaine and 1% lidocaine with epinephrine 1:100,000.  IV FLUIDS:  3700 mL Ringer's lactate.  ESTIMATED BLOOD LOSS:  350 mL.  URINE OUTPUT:  400 mL.  COMPLICATIONS:  None.  INDICATIONS FOR THE PROCEDURE:  The patient is a 60 year old gravida 47 Caucasian female, who presents with urinary incontinence with coughing, sneezing, and sudden movement.  Office examination confirmed a small cystocele, rectocele, and first-degree uterine prolapse.  The patient had evidence of stress incontinence with a Valsalva maneuver in the office.  A plan is made to proceed at this time with a laparoscopically assisted vaginal hysterectomy with bilateral salpingo-oophorectomy, anterior and posterior colporrhaphy along with a mid urethral sling and cystoscopy.  Risks, benefits, alternatives, and surgical goals have been discussed with the patient, who wishes to proceed.  FINDINGS:  The patient was noted to have a small and very retroverted uterus.  The bilateral tubes and ovaries were unremarkable.  Inside the peritoneal cavity along the left pelvic side wall just above the pelvic brim there was some hemosiderin type  staining of the peritoneum just lateral to the sigmoid colon.  This appeared to be consistent with possible old endometriosis.  In the upper abdomen, the liver was unremarkable.  The gallbladder was not visualized.  There was no evidence of any adhesive disease in the abdomen or pelvis.  Cystoscopy with placement of the mid urethral sling documented the absence of a foreign body in the bladder or in the urethra.  The bladder was visualized throughout 360 degrees and had a normal bladder dome and trigone.  The ureters were noted to be patent bilaterally after the injection of indigo carmine dye IV.  PROCEDURE:  The patient was reidentified in the preoperative hold area. She received cefotetan IV for antibiotic prophylaxis.  She received TED hose and PAS stockings for DVT prophylaxis.  In the operating room, general endotracheal anesthesia was induced and the patient was placed in the dorsal lithotomy position.  The abdomen, vagina, and perineum were sterilely prepped and draped.  A Foley catheter was placed inside the bladder.  A speculum was placed inside the vagina and a single-tooth tenaculum was placed on the anterior cervical lip.  A Hasson cannula was attached to the single-tooth tenaculum.  Attention was then turned to the abdomen where a 1 cm umbilical incision was created sharply with a scalpel.  Dissection down to the fascia was performed with an Allis clamp.  A 10 mm trocar was then inserted directly into  the peritoneal cavity without difficulty.  The laparoscope confirmed proper placement.  A CO2 pneumoperitoneum was achieved.  The patient was placed in the Trendelenburg position.  A 5 mm right and left lower quadrant incisions were created and then 5 mm trocars were placed under direct visualization of the laparoscope.  An inspection of the pelvic and abdominal organs was performed and the findings are as noted above.  The procedure began by identifying the left  ureter.  The infundibulopelvic ligament was then triply cauterized and divided with the gyrus instrument.  Dissection continued through the broad ligament using the same instrument.  The left round ligament was then cauterized and bisected.  Dissection through the anterior leaf of the broad ligament was performed using the same instrumentation.  The same procedure was then performed on the patient's right-hand side after the right ureter had been identified.  All the pedicle sites were hemostatic and the hysterectomy was completed vaginally.  The patient was placed in the high lithotomy position.  A single-tooth tenaculum was placed on each of the anterior and posterior cervical lips.  The cervix was injected circumferentially with 1% lidocaine with epinephrine 1:100,000.  The cervix was circumscribed with a scalpel. The posterior cul-de-sac was entered sharply with a Mayo scissors. Digital exam confirmed proper entry into this location.  The long weighted speculum was then placed in the posterior cul-de-sac.  The bladder was dissected off of the cervix anteriorly.  Each of the uterosacral ligaments were then clamped, sharply divided, and suture ligated with transfixing sutures of 0 Vicryl.  The branches of the cardinal ligaments were then sequentially clamped, sharply ligated, and suture ligated with 0 Vicryl bilaterally.  The cervix was fairly long.  The bladder had to be dissected off of the cervix to continue taking the cardinal ligament pedicles.  Eventually the anterior cul-de-sac was entered sharply and again digital exam confirmed proper entry into this location.  A Deaver retractor was placed into the anterior cul-de-sac.  The remainder of the cardinal ligament pedicles were then clamped, sharply divided, and ligated with free ties of 0 Vicryl at the top.  This allowed the specimen to be freed.  The uterus, cervix, tubes, and ovaries were then sent to pathology.  The  pedicles were examined at this time.  There was a little bit of bleeding noted along the cardinal ligament on the patient's right hand side and this was grasped with a right angle clamp and was then free tied with 0 Vicryl.  The posterior vaginal cuff was then whip stitched with a running lock suture of 0 Vicryl.  A McCall culdoplasty was performed using 0 Vicryl suture.  The suture was brought from the vagina and into the posterior cul-de-sac at the 6 o'clock position.  It was brought down through the distal left uterosacral ligament.  It was taken across the posterior cul-de-sac in a pursestring fashion.  It was then brought through the distal right uterosacral ligament.  The suture was taken then from the cul-de-sac out into the vagina at the 6 o'clock position.  The suture was held and tied at the end of the surgery to give excellent vaginal cuff support.  The vaginal cuff was then closed with a running locked suture of 0 Vicryl.  The anterior vaginal wall was examined and it appeared that the patient had really a urethrocele more then a cystocele.  The anterior vaginal mucosa was marked with Allis clamps.  The mucosa was injected with 1% lidocaine with epinephrine  1:100,000.  The vaginal mucosa was incised vertically with a scalpel.  There were lots of rugae in the anterior vaginal wall.  The subvaginal tissue and bladder were carefully dissected off of the vaginal mucosa bilaterally.  There were several bleeding veins during the course of this dissection.  Cautery was used to create hemostasis.  The dissection was carried back to the pubic rami bilaterally.  A 1 cm suprapubic incisions were created with a scalpel 2 cm to the right and left in the midline.  The TVT Exact was then performed in standard fashion going from down to up.  The patient's Foley catheter was removed and the rigid Foley guide was then placed in the urethra and the urethra was deflected properly so that  the sling could be first placed on the patient's right hand side.  The sling was brought through the endopelvic fascia, through the space of Retzius and then out through the right suprapubic incision.  The urethra was then deflected in the other direction so that the sling could be placed similarly on the patient's left-hand side.  Cystoscopy was performed at this time and the findings are as noted above.  The bladder was drained of all cystoscopic fluid and the Foley catheter was replaced.  The sling was drawn up through each of the suprapubic incisions.  A Kelly clamp was placed between the sling and the urethra as the plastic sheaths were removed.  Excess sling was trimmed suprapubically.  The sling was noted to be in excellent position.  A small anterior colporrhaphy was performed at this time with a combination of 2-0 and 0 Vicryl vertical mattress sutures.  There was some bleeding of the veins again across the bladder more on the patient's right-hand side near the junction between the mucosa and the bladder.  Figure-of-eight sutures of 2-0 Vicryl and 0 Vicryl were placed along with some Surgiflo to create good hemostasis.  Excess vaginal mucosa was trimmed and the anterior vaginal wall was then closed with a running lock suture of 2-0 Vicryl.  There was some bleeding noted along the midportion of the closure of the anterior vaginal wall and a figure- of-eight suture of 0 Vicryl was placed which then created excellent hemostasis.  The posterior vaginal wall was examined and there was evidence of a small rectocele.  Allis clamps were then used to mark the perineal body and the posterior vaginal mucosa up to a distance of approximately 4 cm above the hymen.  The mucosa was injected locally with 1% lidocaine with epinephrine 1:100,000.  A triangular wedge of epithelium was excised from the perineal body and the posterior vaginal mucosa was opened vertically in the midline with  Metzenbaum scissors.  Sharp dissection was used to dissect the rectovaginal fascia and rectum off of the overlying vaginal mucosa.  Hemostasis was created with monopolar cautery during the dissection.  Vertical mattress sutures of 0 Vicryl were used to perform the perineorrhaphy.  Minimal rectocele was also reduced with vertical mattress sutures of 0 Vicryl.  Excess vaginal mucosa was trimmed and then the posterior vaginal mucosa was closed with a running lock suture of 0 Vicryl which continued along the perineal body in a subcuticular fashion.  There was a crown stitch of 0 Vicryl which was also placed in the perineal body.  A vaginal packing with Estrace cream was placed inside the vagina.  Attention was then turned to the abdomen where the CO2 pneumoperitoneum was reachieved.  The pelvis was irrigated and suctioned  and all operative sites were hemostatic.  The lower abdominal trocars were removed under direct visualization of the laparoscope.  The umbilical trocar and the laparoscope were removed simultaneously after the pneumoperitoneum was released.  All skin incisions were closed with subcuticular sutures of 3-0 Vicryl followed by Dermabond.  This concluded the patient's procedure.  There were no complications. All needle, instrument, and sponge counts were correct.  The patient was escorted to the recovery room in stable and awake condition.     Randye Lobo, M.D.     BES/MEDQ  D:  07/13/2011  T:  07/13/2011  Job:  [id number]

## 2011-07-13 NOTE — Anesthesia Preprocedure Evaluation (Signed)
Anesthesia Evaluation  Patient identified by MRN, date of birth, ID band Patient awake    Reviewed: Allergy & Precautions, H&P , NPO status , Patient's Chart, lab work & pertinent test results, reviewed documented beta blocker date and time   Airway Mallampati: II TM Distance: <3 FB Neck ROM: full    Dental No notable dental hx.    Pulmonary neg pulmonary ROS,    Pulmonary exam normal       Cardiovascular hypertension, Pt. on home beta blockers     Neuro/Psych PSYCHIATRIC DISORDERS Depression    GI/Hepatic GERD-  Medicated and Controlled,  Endo/Other  Diabetes mellitus-, Well Controlled, Type 2, Oral Hypoglycemic Agents  Renal/GU negative Renal ROS     Musculoskeletal negative musculoskeletal ROS (+)   Abdominal Normal abdominal exam  (+)   Peds negative pediatric ROS (+)  Hematology negative hematology ROS (+)   Anesthesia Other Findings   Reproductive/Obstetrics negative OB ROS                           Anesthesia Physical Anesthesia Plan  ASA: II  Anesthesia Plan: General   Post-op Pain Management:    Induction: Intravenous  Airway Management Planned: Oral ETT  Additional Equipment:   Intra-op Plan:   Post-operative Plan: Extubation in OR  Informed Consent: I have reviewed the patients History and Physical, chart, labs and discussed the procedure including the risks, benefits and alternatives for the proposed anesthesia with the patient or authorized representative who has indicated his/her understanding and acceptance.   Dental advisory given  Plan Discussed with: Anesthesiologist  Anesthesia Plan Comments: (1. Getting Zofran now for N/V now. 2. Pt took her beta blocker and did not see it come back up.   3)        Anesthesia Quick Evaluation

## 2011-07-13 NOTE — Brief Op Note (Signed)
07/13/2011  11:36 AM  PATIENT:  [name]  60 y.o. female  PRE-OPERATIVE DIAGNOSIS:  Incomplete Uterovaginal Prolapse; Stress Urinary Incontinence  POST-OPERATIVE DIAGNOSIS:  Incomplete Uterovaginal Prolapse; Stress Urinary Incontinence  PROCEDURE:  Procedure(s): LAPAROSCOPIC ASSISTED VAGINAL HYSTERECTOMY SALPINGO OOPHERECTOMY ANTERIOR (CYSTOCELE) COLPORRHAPHY AND PERINEORRHAPHY TRANSVAGINAL TAPE (TVT) PROCEDURE CYSTOSCOPY  SURGEON:  Surgeon(s): Brook A Silva W Scott Bowie  PHYSICIAN ASSISTANT:   ASSISTANTS: Lodema Hong   ANESTHESIA:   general  EBL:  Total I/O In: 3000 [I.V.:3000] Out: 350 [Blood:350]  BLOOD ADMINISTERED:none  DRAINS: Urinary Catheter (Foley)   LOCAL MEDICATIONS USED:  MARCAINE .25% CC, LIDOCAINE 1% WITH EPI 1:100,000  SPECIMEN:  Source of Specimen:   UTERUS, CERVIX, TUBES, OVARIES  DISPOSITION OF SPECIMEN:  PATHOLOGY  COUNTS:  YES  TOURNIQUET:  * No tourniquets in log *  DICTATION: .Other Dictation: Dictation Number    PLAN OF CARE: Admit to inpatient   PATIENT DISPOSITION:  PACU - hemodynamically stable.   Delay start of Pharmacological VTE agent (>24hrs) due to surgical blood loss or risk of bleeding:  {YES/NO/NOT APPLICABLE:20182

## 2011-07-13 NOTE — Progress Notes (Signed)
Post Op Check  S - Reports some pain.  Ambulated in the halls once. O - T 98.2 BP 159/86, P 59, RR 17  UO - 400 cc  O2 sat - 96% on 2 Liters Success  CBG  - 132  Lungs - CTA bilaterally.  Cor - S1S2 RRR  Abdomen - soft, nontender, nondistended.  Incisions - clean, dry, intact.  Vaginal pad - dry.  Ext - PAS and Ted hose on. A/P - Status post LAVH/BSO/Anterior Colporrhaphy/Perineorrhaphy/TVT Exact sling/Cystoscopy.  Stable post op.  - Morphine PCA.  - Clear liquid diet.  - Vaginal packing and Foley overnight.  - CBC and BMP in am.  - I discussed with the patient the surgical findings and treatment.  Questions answered.

## 2011-07-14 LAB — GLUCOSE, CAPILLARY: Glucose-Capillary: 133 mg/dL — ABNORMAL HIGH (ref 70–99)

## 2011-07-14 LAB — BASIC METABOLIC PANEL
Calcium: 8.9 mg/dL (ref 8.4–10.5)
GFR calc Af Amer: >90 mL/min (ref >90)
GFR calc non Af Amer: >90 mL/min (ref >90)
Glucose, Bld: 134 mg/dL — ABNORMAL HIGH (ref 70–99)
Potassium: 3.4 mEq/L — ABNORMAL LOW (ref 3.5–5.1)
Sodium: 140 mEq/L (ref 135–145)

## 2011-07-14 LAB — CBC
Hemoglobin: 11 g/dL — ABNORMAL LOW (ref 12.0–15.0)
MCH: 27.4 pg (ref 26.0–34.0)
MCHC: 32.6 g/dL (ref 30.0–36.0)
Platelets: 160 10*3/uL (ref 150–400)
RDW: 13.8 % (ref 11.5–15.5)

## 2011-07-14 MED ORDER — DSS 100 MG PO CAPS: 100.0000 mg | ORAL_CAPSULE | Freq: Every day | ORAL | Status: AC

## 2011-07-14 MED ORDER — IBUPROFEN 600 MG PO TABS: 600.0000 mg | ORAL_TABLET | Freq: Four times a day (QID) | ORAL | Status: AC | PRN

## 2011-07-14 MED ORDER — OXYCODONE-ACETAMINOPHEN 5-325 MG PO TABS: 1.0000 | ORAL_TABLET | ORAL | Status: AC | PRN

## 2011-07-14 NOTE — Progress Notes (Signed)
UR Chart review completed.  

## 2011-07-14 NOTE — Addendum Note (Signed)
Addendum  created 07/14/11 0827 by Edison Pace, CRNA   Modules edited:Notes Section

## 2011-07-14 NOTE — Anesthesia Postprocedure Evaluation (Signed)
  Anesthesia Post-op Note  Patient: [name]  Procedure(s) Performed:  LAPAROSCOPIC ASSISTED VAGINAL HYSTERECTOMY; SALPINGO OOPHERECTOMY; ANTERIOR (CYSTOCELE) AND POSTERIOR REPAIR (RECTOCELE); TRANSVAGINAL TAPE (TVT) PROCEDURE; CYSTOSCOPY  Patient Location: [facility]  Anesthesia Type: General  Level of Consciousness: awake, alert  and oriented  Airway and Oxygen Therapy: Patient Spontanous Breathing  Post-op Pain: none  Post-op Assessment: Post-op Vital signs reviewed and Patient's Cardiovascular Status Stable  Post-op Vital Signs: Reviewed and stable  Complications: No apparent anesthesia complications

## 2011-07-14 NOTE — Progress Notes (Signed)
POD #1  S - Foley out.  No void yet.  Hasn't eaten yet. O -  AVSS  UO - 3500 cc  Lungs - CTA bilaterally.  Cor - S1S2 RRR.  Abdomen -  Soft, nontender, nondistended.  Incisions - clean, dry, intact.    Vaginal pad -  Minor spotting.  Hgb - 12.9  CBGs - 120s - 140s A/P -  S/P LAVH/BSO/Anterior colporrhaphy/Perineorrhaphy/TVT Exact/Cystoscopy - POD #1.  - Advance diet.  - Ambulate.  - Voiding trials.  - D/C PCA and start po pain meds.  - Anticipate discharge today to home.  - Will give prescriptions for Percocet, Ibuprophen.  Patient will need to stop norethindrone and she may continue with her estrogen therapy.  - Instructions/precautions given.  - Follow up in office in 10 days.

## 2011-07-14 NOTE — Discharge Summary (Signed)
Admission Date - 07/13/11 Discharge Date - 07/14/11 Admission Dx - Incomplete uterovaginal prolapse, genuine stress incontinence Discharge Dx -  Incomplete uterovaginal prolapse, genuine stress incontinence Procedure - Laparoscopically assisted vaginal hysterectomy, bilateral salpingo-oophorectomy, anterior colporrhaphy, perineorrhaphy, TVT Exact, cystoscopy on 07/13/11 at the [facility] under the direction of Dr. Conley Simmonds and with the assistance of Dr. Lodema Hong. Admission Hx and Physical - 60 year old G0 female presents with pelvic organ prolapse and incontinence of urine with maneuvers causing increases in abdominal pressure.  Patient has a history of HTN and diabetes.  Patient requested surgical treatment.  Her pelvic exam demonstrated a first degree cystocele, first degree uterine prolapse, and a first degree rectocele.  Hospital Course - Patient underwent a Laparoscopically assisted vaginal hysterectomy, bilateral salpingo-oophorectomy, anterior colporrhaphy, perineorrhaphy, TVT Exact, cystoscopy on 07/13/11 at the [facility] under the direction of Dr. Conley Simmonds and with the assistance of Dr. Lodema Hong.  Surgery was uncomplicated.  Post operatively, the patient's recovery was benign.  She had good control of her pain with a morphine PCA, which was converted to percocet and motrin.  Her vagina packing was removed on post op day one along with her foley, and she will begin voiding trials now.  She will advance to a regular diet.  She is ambulating independently.  Her discharge Hgb is 11.0  Her CBG were in the 120s- 140s, and she was covered with insulin during her hospital stay.   Disposition - 1. Patient will be discharge to home 2. She will have a foley catheter replaced if she has PVRs over 100cc. 3.   Rx for percocet, motrin, and colace.  Medication requisition has been updated.  Patient will stop norethindrone. 4. Follow up in 5 days. 5. Written instructions and verbal  instructions and precautions were given to patient.

## 2011-07-16 ENCOUNTER — Encounter (HOSPITAL_COMMUNITY): Payer: Self-pay | Admitting: Obstetrics and Gynecology

## 2011-08-17 HISTORY — PX: LAPAROSCOPIC CHOLECYSTECTOMY: SUR755

## 2011-08-21 ENCOUNTER — Inpatient Hospital Stay (HOSPITAL_COMMUNITY)
Admission: AD | Admit: 2011-08-21 | Discharge: 2011-08-26 | DRG: 188 | Disposition: A | Discharge status: Home / Self Care | Payer: BC Managed Care – PPO | Source: Other Acute Inpatient Hospital | Attending: Internal Medicine | Admitting: Internal Medicine

## 2011-08-21 DIAGNOSIS — K9189 Other postprocedural complications and disorders of digestive system: Secondary | ICD-10-CM | POA: Diagnosis present

## 2011-08-21 DIAGNOSIS — M129 Arthropathy, unspecified: Secondary | ICD-10-CM | POA: Diagnosis present

## 2011-08-21 DIAGNOSIS — R112 Nausea with vomiting, unspecified: Secondary | ICD-10-CM | POA: Diagnosis present

## 2011-08-21 DIAGNOSIS — E119 Type 2 diabetes mellitus without complications: Secondary | ICD-10-CM | POA: Diagnosis present

## 2011-08-21 DIAGNOSIS — K219 Gastro-esophageal reflux disease without esophagitis: Secondary | ICD-10-CM | POA: Diagnosis present

## 2011-08-21 DIAGNOSIS — K929 Disease of digestive system, unspecified: Principal | ICD-10-CM | POA: Diagnosis present

## 2011-08-21 DIAGNOSIS — K838 Other specified diseases of biliary tract: Secondary | ICD-10-CM | POA: Diagnosis present

## 2011-08-21 DIAGNOSIS — E785 Hyperlipidemia, unspecified: Secondary | ICD-10-CM | POA: Diagnosis present

## 2011-08-21 DIAGNOSIS — R000 Tachycardia, unspecified: Secondary | ICD-10-CM | POA: Diagnosis present

## 2011-08-21 DIAGNOSIS — E876 Hypokalemia: Secondary | ICD-10-CM | POA: Diagnosis present

## 2011-08-21 DIAGNOSIS — I10 Essential (primary) hypertension: Secondary | ICD-10-CM | POA: Diagnosis present

## 2011-08-21 DIAGNOSIS — Y836 Removal of other organ (partial) (total) as the cause of abnormal reaction of the patient, or of later complication, without mention of misadventure at the time of the procedure: Secondary | ICD-10-CM | POA: Diagnosis present

## 2011-08-21 LAB — CBC
HCT: 35.4 % — ABNORMAL LOW (ref 36.0–46.0)
Hemoglobin: 11.5 g/dL — ABNORMAL LOW (ref 12.0–15.0)
MCH: 26.7 pg (ref 26.0–34.0)
MCHC: 32.5 g/dL (ref 30.0–36.0)
RBC: 4.31 MIL/uL (ref 3.87–5.11)

## 2011-08-21 LAB — COMPREHENSIVE METABOLIC PANEL
AST: 27 U/L (ref 0–37)
Albumin: 3.1 g/dL — ABNORMAL LOW (ref 3.5–5.2)
Alkaline Phosphatase: 112 U/L (ref 39–117)
Chloride: 107 mEq/L (ref 96–112)
Potassium: 3 mEq/L — ABNORMAL LOW (ref 3.5–5.1)
Total Bilirubin: 0.9 mg/dL (ref 0.3–1.2)

## 2011-08-21 LAB — LIPID PANEL
Cholesterol: 98 mg/dL (ref 0–200)
HDL: 46 mg/dL (ref >39)
Triglycerides: 63 mg/dL (ref <150)

## 2011-08-21 LAB — PROTIME-INR: Prothrombin Time: 17 seconds — ABNORMAL HIGH (ref 11.6–15.2)

## 2011-08-21 LAB — PHOSPHORUS: Phosphorus: 2.6 mg/dL (ref 2.3–4.6)

## 2011-08-21 MED ORDER — METOPROLOL TARTRATE 50 MG PO TABS
50.0000 mg | ORAL_TABLET | Freq: Every day | ORAL | Status: DC
  Administered 2011-08-21 – 2011-08-26 (×6): 50 mg via ORAL
  Filled 2011-08-21 (×7): qty 1

## 2011-08-21 MED ORDER — ONDANSETRON HCL 4 MG/2ML IJ SOLN
4.0000 mg | Freq: Four times a day (QID) | INTRAMUSCULAR | Status: DC | PRN
  Administered 2011-08-22 – 2011-08-26 (×7): 4 mg via INTRAVENOUS
  Filled 2011-08-21 (×7): qty 2

## 2011-08-21 MED ORDER — CALCIUM CARBONATE 600 MG PO TABS: 600.0000 mg | ORAL_TABLET | Freq: Every day | ORAL | Status: DC

## 2011-08-21 MED ORDER — VENLAFAXINE HCL 75 MG PO TABS: 150.0000 mg | ORAL_TABLET | Freq: Every day | ORAL | Status: DC

## 2011-08-21 MED ORDER — VITAMIN B-12 1000 MCG PO TABS
1000.0000 ug | ORAL_TABLET | Freq: Every day | ORAL | Status: DC
  Administered 2011-08-21 – 2011-08-25 (×3): 1000 ug via ORAL
  Filled 2011-08-21 (×5): qty 1

## 2011-08-21 MED ORDER — POLYETHYLENE GLYCOL 3350 17 G PO PACK
17.0000 g | PACK | Freq: Every day | ORAL | Status: DC | PRN
  Filled 2011-08-21: qty 1

## 2011-08-21 MED ORDER — MORPHINE SULFATE 2 MG/ML IJ SOLN
1.0000 mg | INTRAMUSCULAR | Status: DC | PRN
  Administered 2011-08-22 – 2011-08-25 (×3): 1 mg via INTRAVENOUS
  Filled 2011-08-21 (×3): qty 1

## 2011-08-21 MED ORDER — VENLAFAXINE HCL 75 MG PO TABS
150.0000 mg | ORAL_TABLET | Freq: Every day | ORAL | Status: DC
  Administered 2011-08-21 – 2011-08-26 (×5): 150 mg via ORAL
  Filled 2011-08-21 (×6): qty 2

## 2011-08-21 MED ORDER — PIPERACILLIN-TAZOBACTAM IN DEX 2-0.25 GM/50ML IV SOLN: 2.2500 g | Freq: Four times a day (QID) | INTRAVENOUS | Status: DC

## 2011-08-21 MED ORDER — OXYCODONE HCL 5 MG PO TABS
5.0000 mg | ORAL_TABLET | ORAL | Status: DC | PRN
  Administered 2011-08-25: 5 mg via ORAL
  Filled 2011-08-21: qty 1

## 2011-08-21 MED ORDER — ENOXAPARIN SODIUM 40 MG/0.4ML ~~LOC~~ SOLN
40.0000 mg | SUBCUTANEOUS | Status: DC
  Administered 2011-08-21 – 2011-08-25 (×5): 40 mg via SUBCUTANEOUS
  Filled 2011-08-21 (×6): qty 0.4

## 2011-08-21 MED ORDER — SIMVASTATIN 20 MG PO TABS: 20.0000 mg | ORAL_TABLET | Freq: Every day | ORAL | Status: DC

## 2011-08-21 MED ORDER — VITAMIN C 500 MG PO TABS
1000.0000 mg | ORAL_TABLET | Freq: Every day | ORAL | Status: DC
  Administered 2011-08-21 – 2011-08-25 (×3): 1000 mg via ORAL
  Filled 2011-08-21 (×5): qty 2

## 2011-08-21 MED ORDER — CALCIUM CARBONATE 1250 (500 CA) MG PO TABS
1.0000 | ORAL_TABLET | Freq: Every day | ORAL | Status: DC
  Administered 2011-08-21 – 2011-08-25 (×4): 500 mg via ORAL
  Filled 2011-08-21 (×5): qty 1

## 2011-08-21 MED ORDER — PIPERACILLIN-TAZOBACTAM 3.375 G IVPB
3.3750 g | Freq: Three times a day (TID) | INTRAVENOUS | Status: DC
  Administered 2011-08-22 – 2011-08-26 (×14): 3.375 g via INTRAVENOUS
  Filled 2011-08-21 (×18): qty 50

## 2011-08-21 MED ORDER — ONDANSETRON HCL 4 MG PO TABS
4.0000 mg | ORAL_TABLET | Freq: Four times a day (QID) | ORAL | Status: DC | PRN
  Administered 2011-08-23: 4 mg via ORAL
  Filled 2011-08-21: qty 1

## 2011-08-21 MED ORDER — SIMVASTATIN 40 MG PO TABS
40.0000 mg | ORAL_TABLET | Freq: Every day | ORAL | Status: DC
  Administered 2011-08-21 – 2011-08-24 (×2): 40 mg via ORAL
  Filled 2011-08-21 (×5): qty 1

## 2011-08-21 MED ORDER — PANTOPRAZOLE SODIUM 40 MG PO TBEC
40.0000 mg | DELAYED_RELEASE_TABLET | Freq: Every day | ORAL | Status: DC
  Administered 2011-08-21 – 2011-08-26 (×5): 40 mg via ORAL
  Filled 2011-08-21 (×6): qty 1

## 2011-08-21 MED ORDER — ZOLPIDEM TARTRATE 5 MG PO TABS
10.0000 mg | ORAL_TABLET | Freq: Every day | ORAL | Status: DC
  Administered 2011-08-21 – 2011-08-23 (×2): 10 mg via ORAL
  Filled 2011-08-21 (×2): qty 1
  Filled 2011-08-21: qty 2

## 2011-08-21 MED ORDER — ESTROGENS CONJ SYNTHETIC A 0.9 MG PO TABS: 0.9000 mg | ORAL_TABLET | Freq: Every day | ORAL | Status: DC

## 2011-08-21 MED ORDER — METFORMIN HCL ER 500 MG PO TB24
500.0000 mg | ORAL_TABLET | Freq: Every day | ORAL | Status: DC
  Filled 2011-08-21 (×2): qty 1

## 2011-08-21 MED ORDER — SODIUM CHLORIDE 0.9 % IV SOLN
INTRAVENOUS | Status: DC
  Administered 2011-08-21 – 2011-08-23 (×4): via INTRAVENOUS
  Administered 2011-08-24: 1000 mL via INTRAVENOUS

## 2011-08-21 MED ORDER — ESTROGENS CONJUGATED 0.9 MG PO TABS
0.9000 mg | ORAL_TABLET | Freq: Every day | ORAL | Status: DC
  Administered 2011-08-22 – 2011-08-26 (×5): 0.9 mg via ORAL
  Filled 2011-08-21 (×6): qty 1

## 2011-08-21 NOTE — Consults (Signed)
[facility] Consult Note  Referring Provider: No ref. provider found Primary Care Physician:  Criss Rosales, MD Primary Gastroenterologist:  Dr.  Antony Contras Complaint: Abdominal pain HPI: [name] is an 60 y.o. white female.  Transferred from [facility] for suspected biliary leak in 4 days after laparoscopic cholecystectomy. She has had persistent pain which has been intermittent but generally worsening since her discharge 2 days ago. She was sent to the emergency room today where she had minimally elevated liver function tests, a normal temperature and minimal leukocytosis but was found to have a suspected biliary leak by hepatobiliary imaging. ERCP was not available there so she was transferred here. Her pain has been moderately improved with IV analgesics. She has not had any vomiting or chills.  Past Medical History  Diagnosis Date  . Hydronephrosis   . Hydronephrosis   . Hyperlipidemia   . GERD (gastroesophageal reflux disease)   . Diabetes mellitus   . Hypertension   . Depression   . Headache   . Arthritis     fingers    Past Surgical History  Procedure Date  . Ureteroscopy  4/12, 2/12, 3/10    X 3 SURGERIES  FOR KIDNEY STONES  . Cystoscopy     X 3  . Laparoscopic assisted vaginal hysterectomy 07/13/2011    Procedure: LAPAROSCOPIC ASSISTED VAGINAL HYSTERECTOMY;  Surgeon: Melony Overly;  Location: [facility];  Service: Gynecology;  Laterality: N/A;  . Salpingoophorectomy 07/13/2011    Procedure: SALPINGO OOPHERECTOMY;  Surgeon: Melony Overly;  Location: [facility];  Service: Gynecology;  Laterality: Bilateral;  . Anterior and posterior repair 07/13/2011    Procedure: ANTERIOR (CYSTOCELE) AND POSTERIOR REPAIR (RECTOCELE);  Surgeon: Melony Overly;  Location: [facility];  Service: Gynecology;  Laterality: N/A;  . Bladder suspension 07/13/2011    Procedure: TRANSVAGINAL TAPE (TVT) PROCEDURE;  Surgeon: Melony Overly;  Location: [facility];  Service: Gynecology;  Laterality:  N/A;  . Cystoscopy 07/13/2011    Procedure: CYSTOSCOPY;  Surgeon: Melony Overly;  Location: [facility];  Service: Gynecology;  Laterality: N/A;    Medications Prior to Admission  Medication Dose Route Frequency Provider Last Rate Last Dose  . 0.9 %  sodium chloride infusion   Intravenous Continuous Iskra Magick-Myers      . calcium carbonate (OS-CAL - dosed in mg of elemental calcium) tablet 500 mg of elemental calcium  1 tablet Oral Daily Christopher T Elder, PHARMD      . enoxaparin (LOVENOX) injection 40 mg  40 mg Subcutaneous Q24H Iskra Magick-Myers      . estrogens (conjugated) (PREMARIN) tablet 0.9 mg  0.9 mg Oral Daily Christopher T Elder, PHARMD      . metFORMIN (GLUCOPHAGE-XR) 24 hr tablet 500 mg  500 mg Oral Q breakfast Iskra Magick-Myers      . metoprolol (LOPRESSOR) tablet 50 mg  50 mg Oral Daily Iskra Magick-Myers      . morphine 2 MG/ML injection 1 mg  1 mg Intravenous Q4H PRN Iskra Magick-Myers      . ondansetron (ZOFRAN) tablet 4 mg  4 mg Oral Q6H PRN Iskra Magick-Myers       Or  . ondansetron (ZOFRAN) injection 4 mg  4 mg Intravenous Q6H PRN Iskra Magick-Myers      . oxyCODONE (Oxy IR/ROXICODONE) immediate release tablet 5 mg  5 mg Oral Q4H PRN Iskra Magick-Myers      . pantoprazole (PROTONIX) EC tablet 40 mg  40 mg Oral Daily Iskra Magick-Myers      .  piperacillin-tazobactam (ZOSYN) IVPB 2.25 g  2.25 g Intravenous Q6H Barrie Folk, MD      . polyethylene glycol (MIRALAX / GLYCOLAX) packet 17 g  17 g Oral Daily PRN Iskra Magick-Myers      . simvastatin (ZOCOR) tablet 40 mg  40 mg Oral q1800 Christopher T Elder, PHARMD      . venlafaxine [facility]) tablet 150 mg  150 mg Oral Daily Christopher T Elder, PHARMD      . vitamin B-12 (CYANOCOBALAMIN) tablet 1,000 mcg  1,000 mcg Oral Daily Iskra Magick-Myers      . vitamin C (ASCORBIC ACID) tablet 1,000 mg  1,000 mg Oral Daily Iskra Magick-Myers      . zolpidem (AMBIEN) tablet 10 mg  10 mg Oral QHS Iskra Magick-Myers      . DISCONTD:  calcium carbonate (OS-CAL) tablet 600 mg  600 mg Oral Daily Iskra Magick-Myers      . DISCONTD: estrogens conjugated (synthetic A) (CENESTIN) tablet 0.9 mg  0.9 mg Oral Daily Iskra Magick-Myers      . DISCONTD: simvastatin (ZOCOR) tablet 20 mg  20 mg Oral Daily Iskra Magick-Myers      . DISCONTD: venlafaxine (EFFEXOR) tablet 150 mg  150 mg Oral Daily Iskra Magick-Myers       Medications Prior to Admission  Medication Sig Dispense Refill  . atorvastatin (LIPITOR) 20 MG tablet Take 20 mg by mouth daily.        . calcium carbonate (OS-CAL) 600 MG TABS Take 600 mg by mouth daily.        Marland Kitchen esomeprazole (NEXIUM) 40 MG capsule Take 40 mg by mouth daily before breakfast.        . estrogens conjugated, synthetic A, (CENESTIN) 0.9 MG tablet Take 0.9 mg by mouth daily.        . metFORMIN (GLUCOPHAGE-XR) 500 MG 24 hr tablet Take 500 mg by mouth daily with breakfast.       . metoprolol (LOPRESSOR) 50 MG tablet Take 50 mg by mouth daily.       Marland Kitchen venlafaxine (EFFEXOR) 100 MG tablet Take 150 mg by mouth daily.       . vitamin B-12 (CYANOCOBALAMIN) 1000 MCG tablet Take 1,000 mcg by mouth daily.        . vitamin C (ASCORBIC ACID) 500 MG tablet Take 1,000 mg by mouth daily.          Allergies:  Allergies  Allergen Reactions  . Flomax (Tamsulosin Hcl) Swelling    Pt. Had swelling of jaw  . Sulfa Antibiotics     No family history on file.  Social History:  reports that she has never smoked. She does not have any smokeless tobacco history on file. She reports that she does not drink alcohol or use illicit drugs.  Negative except for the above   Blood pressure 148/94, pulse 130, temperature 98.6 F (37 C), temperature source Oral, resp. rate 22, SpO2 100.00%. Head: Normocephalic, without obvious abnormality, atraumatic Neck: no adenopathy, no carotid bruit, no JVD, supple, symmetrical, trachea midline and thyroid not enlarged, symmetric, no tenderness/mass/nodules Resp: clear to auscultation  bilaterally Cardio: regular rate and rhythm, S1, S2 normal, no murmur, click, rub or gallop GI: Abdomen soft slightly distended with normoactive bowel sounds no hepatomegaly masses or guarding there is diffuse tenderness with minimal rebound on the right greater than the left. Extremities: extremities normal, atraumatic, no cyanosis or edema  Results for orders placed during the hospital encounter of 08/21/11 (from the past 48  hour(s))  CBC     Status: Abnormal   Collection Time   08/21/11  9:11 PM      Component Value Range Comment   WBC 10.5  4.0 - 10.5 (K/uL)    RBC 4.31  3.87 - 5.11 (MIL/uL)    Hemoglobin 11.5 (*) 12.0 - 15.0 (g/dL)    HCT 96.0 (*) 45.4 - 46.0 (%)    MCV 82.1  78.0 - 100.0 (fL)    MCH 26.7  26.0 - 34.0 (pg)    MCHC 32.5  30.0 - 36.0 (g/dL)    RDW 09.8  11.9 - 14.7 (%)    Platelets 156  150 - 400 (K/uL)    No results found.  Assessment: Probable biliary leak after laparoscopic cholecystectomy Plan:  IV antibiotics tonight with ERCP with stent placement tomorrow. Risks rationale and alternatives were explained to the patient and her husband and they agree to proceed. Jony Ladnier C 08/21/2011, 9:55 PM

## 2011-08-21 NOTE — H&P (Signed)
PCP:  Criss Rosales, MD   DOA:  08/21/2011  6:58 PM  Chief Complaint:  RUQ abdominal pain  HPI: Pt is 60 yo female who underwent laparoscopic cholecystectomy 08/17/2011 by Dr. Cristy Folks and now initially presented to [facility] for progressively worsening RUQ abdominal pain that she describes as intermittent, colicky in nature, 7/10 in severity radiating to her back and entire abdomen and with no specific aggravating or alleviating factors. Please note on HIDA scan done at [facility]: there was an indication of suspected biliary leak, Dr. Madilyn Fireman with GI was called and has recommended transfer to [facility] for further evaluation and care and potential need for ERCP.  Allergies: Allergies  Allergen Reactions  . Flomax (Tamsulosin Hcl) Swelling    Pt. Had swelling of jaw  . Sulfa Antibiotics     Prior to Admission medications   Medication Sig Start Date End Date Taking? Authorizing Provider  atorvastatin (LIPITOR) 20 MG tablet Take 20 mg by mouth daily.     Yes Historical Provider, MD  calcium carbonate (OS-CAL) 600 MG TABS Take 600 mg by mouth daily.     Yes Historical Provider, MD  esomeprazole (NEXIUM) 40 MG capsule Take 40 mg by mouth daily before breakfast.     Yes Historical Provider, MD  estrogens conjugated, synthetic A, (CENESTIN) 0.9 MG tablet Take 0.9 mg by mouth daily.     Yes Historical Provider, MD  metFORMIN (GLUCOPHAGE-XR) 500 MG 24 hr tablet Take 500 mg by mouth daily with breakfast.    Yes Historical Provider, MD  metoprolol (LOPRESSOR) 50 MG tablet Take 50 mg by mouth daily.    Yes Historical Provider, MD  venlafaxine (EFFEXOR) 100 MG tablet Take 150 mg by mouth daily.    Yes Historical Provider, MD  vitamin B-12 (CYANOCOBALAMIN) 1000 MCG tablet Take 1,000 mcg by mouth daily.     Yes Historical Provider, MD  vitamin C (ASCORBIC ACID) 500 MG tablet Take 1,000 mg by mouth daily.     Yes Historical Provider, MD    Past Medical History  Diagnosis Date  .  Hydronephrosis   . Hydronephrosis   . Hyperlipidemia   . GERD (gastroesophageal reflux disease)   . Diabetes mellitus   . Hypertension   . Depression   . Headache   . Arthritis     fingers    Past Surgical History  Procedure Date  . Ureteroscopy  4/12, 2/12, 3/10    X 3 SURGERIES  FOR KIDNEY STONES  . Cystoscopy     X 3  . Laparoscopic assisted vaginal hysterectomy 07/13/2011    Procedure: LAPAROSCOPIC ASSISTED VAGINAL HYSTERECTOMY;  Surgeon: Melony Overly;  Location: [facility];  Service: Gynecology;  Laterality: N/A;  . Salpingoophorectomy 07/13/2011    Procedure: SALPINGO OOPHERECTOMY;  Surgeon: Melony Overly;  Location: [facility];  Service: Gynecology;  Laterality: Bilateral;  . Anterior and posterior repair 07/13/2011    Procedure: ANTERIOR (CYSTOCELE) AND POSTERIOR REPAIR (RECTOCELE);  Surgeon: Melony Overly;  Location: [facility];  Service: Gynecology;  Laterality: N/A;  . Bladder suspension 07/13/2011    Procedure: TRANSVAGINAL TAPE (TVT) PROCEDURE;  Surgeon: Melony Overly;  Location: [facility];  Service: Gynecology;  Laterality: N/A;  . Cystoscopy 07/13/2011    Procedure: CYSTOSCOPY;  Surgeon: Melony Overly;  Location: [facility];  Service: Gynecology;  Laterality: N/A;    Social History:  reports that she has never smoked. She does not have any smokeless tobacco history on file. She reports that she  does not drink alcohol or use illicit drugs.  No family history on file.  Review of Systems:  Per HPI  Physical Exam:  Filed Vitals:   08/21/11 1900  BP: 148/94  Pulse: 130  Temp: 98.6 F (37 C)  TempSrc: Oral  Resp: 22  SpO2: 100%    Constitutional: Vital signs reviewed.  Patient is a well-developed and well-nourished in no acute distress and cooperative with exam. Alert and oriented x3.  Head: Normocephalic and atraumatic Ear: TM normal bilaterally Mouth: no erythema or exudates, MMM Eyes: PERRL, EOMI, conjunctivae normal, No scleral icterus.  Neck: Supple, Trachea  midline normal ROM, No JVD, mass, thyromegaly, or carotid bruit present.  Cardiovascular: Regular rhythm but tachycardic, S1 normal, S2 normal, no MRG, pulses symmetric and intact bilaterally Pulmonary/Chest: CTAB, no wheezes, rales, or rhonchi Abdominal: Soft. epigastric tenderness but mostly in RUQ area, non-distended, bowel sounds are normal, no masses, organomegaly, or guarding present.  GU: no CVA tenderness Musculoskeletal: No joint deformities, erythema, or stiffness, ROM full and no nontender Ext: no edema and no cyanosis, pulses palpable bilaterally (DP and PT) Hematology: no cervical, inginal, or axillary adenopathy.  Neurological: A&O x3, Strenght is normal and symmetric bilaterally, cranial nerve II-XII are grossly intact, no focal motor deficit, sensory intact to light touch bilaterally.  Skin: Warm, dry and intact. No rash, cyanosis, or clubbing.  Psychiatric: Normal mood and affect. speech and behavior is normal. Judgment and thought content normal. Cognition and memory are normal.   Labs on Admission:  No results found for this or any previous visit (from the past 48 hour(s)).  Radiological Exams on Admission: No results found.  Assessment/Plan  Principal Problem  *Biliary sludge - pt is s/p lap chole 08/17/2011 - Dr. Madilyn Fireman with GI consulted - follow up on recommendations - ? Need for ERCP - keep NPO after midnight and provide analgesia for adequate pain control  Active Problems:  Tachycardia - likely secondary to volume depletion - continue IVF and monitor vitals per floor protocol - will obtain one time EKG   Hyperlipidemia - check lipid panel   GERD (gastroesophageal reflux disease) - well controlled   Hypertension - currently well controlled - continue to monitor per floor protocol   Diabetes mellitus - check A1C - continue metformin   Hypokalemia - check magnesium level and supplement potassium   Disposition - plan of care and diagnosis,  diagnostic studies and test results were discussed with pt and family at bedside - pt and  family verbalized understanding  Time Spent on Admission: Over 30 minutes  MAGICK-MYERS, ISKRA 08/21/2011, 8:29 PM  [facility] Pager [phone]

## 2011-08-22 ENCOUNTER — Encounter (HOSPITAL_COMMUNITY)
Admission: AD | Disposition: A | Discharge status: Home / Self Care | Payer: Self-pay | Source: Other Acute Inpatient Hospital | Attending: Internal Medicine

## 2011-08-22 ENCOUNTER — Inpatient Hospital Stay (HOSPITAL_COMMUNITY): Payer: BC Managed Care – PPO

## 2011-08-22 ENCOUNTER — Encounter (HOSPITAL_COMMUNITY): Payer: Self-pay | Admitting: *Deleted

## 2011-08-22 HISTORY — PX: BILIARY STENT PLACEMENT: SHX5538

## 2011-08-22 HISTORY — PX: ERCP: SHX5425

## 2011-08-22 LAB — BASIC METABOLIC PANEL
Chloride: 106 mEq/L (ref 96–112)
Creatinine, Ser: 0.71 mg/dL (ref 0.50–1.10)
GFR calc Af Amer: >90 mL/min (ref >90)
GFR calc non Af Amer: >90 mL/min (ref >90)

## 2011-08-22 LAB — CBC
HCT: 32.8 % — ABNORMAL LOW (ref 36.0–46.0)
MCHC: 32.6 g/dL (ref 30.0–36.0)
Platelets: 154 10*3/uL (ref 150–400)
RDW: 14.3 % (ref 11.5–15.5)
WBC: 9 10*3/uL (ref 4.0–10.5)

## 2011-08-22 LAB — GLUCOSE, CAPILLARY: Glucose-Capillary: 100 mg/dL — ABNORMAL HIGH (ref 70–99)

## 2011-08-22 LAB — HEMOGLOBIN A1C
Hgb A1c MFr Bld: 6.5 % — ABNORMAL HIGH (ref <5.7)
Mean Plasma Glucose: 140 mg/dL — ABNORMAL HIGH (ref <117)

## 2011-08-22 LAB — TSH: TSH: 3.191 u[IU]/mL (ref 0.350–4.500)

## 2011-08-22 SURGERY — ERCP, WITH INTERVENTION IF INDICATED
Anesthesia: Moderate Sedation

## 2011-08-22 MED ORDER — MIDAZOLAM HCL 10 MG/2ML IJ SOLN
INTRAMUSCULAR | Status: AC
  Filled 2011-08-22: qty 4

## 2011-08-22 MED ORDER — FENTANYL CITRATE 0.05 MG/ML IJ SOLN
INTRAMUSCULAR | Status: AC
  Filled 2011-08-22: qty 4

## 2011-08-22 MED ORDER — GLUCAGON HCL (RDNA) 1 MG IJ SOLR
INTRAMUSCULAR | Status: AC
  Filled 2011-08-22: qty 2

## 2011-08-22 MED ORDER — MIDAZOLAM HCL 10 MG/2ML IJ SOLN
INTRAMUSCULAR | Status: DC | PRN
  Administered 2011-08-22 (×5): 2 mg via INTRAVENOUS

## 2011-08-22 MED ORDER — BUTAMBEN-TETRACAINE-BENZOCAINE 2-2-14 % EX AERO
INHALATION_SPRAY | CUTANEOUS | Status: DC | PRN
  Administered 2011-08-22: 2 via TOPICAL

## 2011-08-22 MED ORDER — FENTANYL CITRATE 0.05 MG/ML IJ SOLN
INTRAMUSCULAR | Status: DC | PRN
  Administered 2011-08-22 (×4): 25 ug via INTRAVENOUS

## 2011-08-22 MED ORDER — INSULIN ASPART 100 UNIT/ML ~~LOC~~ SOLN
0.0000 [IU] | Freq: Three times a day (TID) | SUBCUTANEOUS | Status: DC
  Administered 2011-08-22: 1 [IU] via SUBCUTANEOUS
  Filled 2011-08-22: qty 3

## 2011-08-22 MED ORDER — SODIUM CHLORIDE 0.9 % IV SOLN
INTRAVENOUS | Status: DC | PRN
  Administered 2011-08-22: 12:00:00

## 2011-08-22 MED ORDER — POTASSIUM CHLORIDE 10 MEQ/100ML IV SOLN
10.0000 meq | INTRAVENOUS | Status: AC
  Administered 2011-08-22 – 2011-08-23 (×3): 10 meq via INTRAVENOUS
  Filled 2011-08-22 (×2): qty 100

## 2011-08-22 MED ORDER — DIPHENHYDRAMINE HCL 50 MG/ML IJ SOLN
INTRAMUSCULAR | Status: AC
  Filled 2011-08-22: qty 1

## 2011-08-22 NOTE — Progress Notes (Signed)
[facility] Progress Note  Subjective: Some increased abdominal pain overnight  Objective: Vital signs in last 24 hours: Temp:  [97.7 F (36.5 [name])-98.6 F (37 [name])] 98.3 F (36.8 [name]) (12/25 0515) Pulse Rate:  [86-130] 88  (12/25 0515) Resp:  [18-24] 24  (12/25 1057) BP: (127-177)/(61-96) 150/96 mmHg (12/25 1057) SpO2:  [92 %-100 %] 93 % (12/25 1205) Weight:  [74.844 kg (165 lb)-81.6 kg (179 lb 14.3 oz)] 165 lb (74.844 kg) (12/25 1057) Weight change:    PE: Abdomen soft, slightly distended with normoactive bowel sounds no hepatomegaly masses or guarding  Lab Results: Results for orders placed during the hospital encounter of 08/21/11 (from the past 24 hour(s))  COMPREHENSIVE METABOLIC PANEL     Status: Abnormal   Collection Time   08/21/11  9:11 PM      Component Value Range   Sodium 142  135 - 145 (mEq/L)   Potassium 3.0 (*) 3.5 - 5.1 (mEq/L)   Chloride 107  96 - 112 (mEq/L)   CO2 24  19 - 32 (mEq/L)   Glucose, Bld 127 (*) 70 - 99 (mg/dL)   BUN 7  6 - 23 (mg/dL)   Creatinine, Ser 1.61  0.50 - 1.10 (mg/dL)   Calcium 9.5  8.4 - 09.6 (mg/dL)   Total Protein 6.5  6.0 - 8.3 (g/dL)   Albumin 3.1 (*) 3.5 - 5.2 (g/dL)   AST 27  0 - 37 (U/L)   ALT 18  0 - 35 (U/L)   Alkaline Phosphatase 112  39 - 117 (U/L)   Total Bilirubin 0.9  0.3 - 1.2 (mg/dL)   GFR calc non Af Amer >90  >90 (mL/min)   GFR calc Af Amer >90  >90 (mL/min)  MAGNESIUM     Status: Normal   Collection Time   08/21/11  9:11 PM      Component Value Range   Magnesium 1.6  1.5 - 2.5 (mg/dL)  PHOSPHORUS     Status: Normal   Collection Time   08/21/11  9:11 PM      Component Value Range   Phosphorus 2.6  2.3 - 4.6 (mg/dL)  CBC     Status: Abnormal   Collection Time   08/21/11  9:11 PM      Component Value Range   WBC 10.5  4.0 - 10.5 (K/uL)   RBC 4.31  3.87 - 5.11 (MIL/uL)   Hemoglobin 11.5 (*) 12.0 - 15.0 (g/dL)   HCT 04.5 (*) 40.9 - 46.0 (%)   MCV 82.1  78.0 - 100.0 (fL)   MCH 26.7  26.0 - 34.0  (pg)   MCHC 32.5  30.0 - 36.0 (g/dL)   RDW 81.1  91.4 - 78.2 (%)   Platelets 156  150 - 400 (K/uL)  APTT     Status: Normal   Collection Time   08/21/11  9:11 PM      Component Value Range   aPTT 33  24 - 37 (seconds)  PROTIME-INR     Status: Abnormal   Collection Time   08/21/11  9:11 PM      Component Value Range   Prothrombin Time 17.0 (*) 11.6 - 15.2 (seconds)   INR 1.36  0.00 - 1.49   TSH     Status: Normal   Collection Time   08/21/11  9:11 PM      Component Value Range   TSH 3.191  0.350 - 4.500 (uIU/mL)  HEMOGLOBIN A1C     Status: Abnormal  Collection Time   08/21/11  9:11 PM      Component Value Range   Hemoglobin A1C 6.5 (*) <5.7 (%)   Mean Plasma Glucose 140 (*) <117 (mg/dL)  LIPID PANEL     Status: Normal   Collection Time   08/21/11  9:16 PM      Component Value Range   Cholesterol 98  0 - 200 (mg/dL)   Triglycerides 63  <161 (mg/dL)   HDL 46  >09 (mg/dL)   Total CHOL/HDL Ratio 2.1     VLDL 13  0 - 40 (mg/dL)   LDL Cholesterol 39  0 - 99 (mg/dL)  BASIC METABOLIC PANEL     Status: Abnormal   Collection Time   08/22/11  5:50 AM      Component Value Range   Sodium 141  135 - 145 (mEq/L)   Potassium 3.2 (*) 3.5 - 5.1 (mEq/L)   Chloride 106  96 - 112 (mEq/L)   CO2 26  19 - 32 (mEq/L)   Glucose, Bld 140 (*) 70 - 99 (mg/dL)   BUN 9  6 - 23 (mg/dL)   Creatinine, Ser 6.04  0.50 - 1.10 (mg/dL)   Calcium 9.4  8.4 - 54.0 (mg/dL)   GFR calc non Af Amer >90  >90 (mL/min)   GFR calc Af Amer >90  >90 (mL/min)  CBC     Status: Abnormal   Collection Time   08/22/11  5:50 AM      Component Value Range   WBC 9.0  4.0 - 10.5 (K/uL)   RBC 3.96  3.87 - 5.11 (MIL/uL)   Hemoglobin 10.7 (*) 12.0 - 15.0 (g/dL)   HCT 98.1 (*) 19.1 - 46.0 (%)   MCV 82.8  78.0 - 100.0 (fL)   MCH 27.0  26.0 - 34.0 (pg)   MCHC 32.6  30.0 - 36.0 (g/dL)   RDW 47.8  29.5 - 62.1 (%)   Platelets 154  150 - 400 (K/uL)  GLUCOSE, CAPILLARY     Status: Abnormal   Collection Time   08/22/11  8:58  AM      Component Value Range   Glucose-Capillary 124 (*) 70 - 99 (mg/dL)   Comment 1 Documented in Chart     Comment 2 Notify RN    GLUCOSE, CAPILLARY     Status: Abnormal   Collection Time   08/22/11 11:06 AM      Component Value Range   Glucose-Capillary 130 (*) 70 - 99 (mg/dL)   Comment 1 Call MD NNP PA CNM      Studies/Results: ERCP, normal common bile duct with no obvious leak identified. 5 cm 10 French stent placed after sphincterotomy   Assessment: Bile leak after cholecystectomy by HIDA scan Plan: Observe for improvement in clinical symptoms, recheck labs in the morning and monitor for complications procedure. Will try clear liquids today and advance as tolerated    [name] 08/22/2011, 12:28 PM

## 2011-08-22 NOTE — Op Note (Signed)
[facility] [address] [place], [place]  [address]  ERCP PROCEDURE REPORT  PATIENT:  [name], [name]  MR#:  [id number] BIRTHDATE:  1951/04/16  GENDER:  female  ENDOSCOPIST:  Dorena Cookey ASSISTANT:  PROCEDURE DATE:  08/22/2011 PROCEDURE:  Endoscopic retrograde cholangiopancreatography with this stent placement ASA CLASS:  INDICATIONS:   biliary leak after her laparoscopic cholecystectomy   MEDICATIONS:  Fentanyl 100 mcg, Versed 10 mg TOPICAL ANESTHETIC:  DESCRIPTION OF PROCEDURE:   After the risks benefits and alternatives of the procedure were thoroughly explained, informed consent was obtained.  The [id number] [id number] and [id number]) endoscope was introduced through the mouth and advanced to the .The stomach was long and tortuous and difficult to get the scope through. The pylorus appeared normal. The papilla of Vater appeared normal. Initial cannulation, a wire passage and opacification was of the pancreatic duct. The cannula was removed and repositioned and selective cannulation of the common bile duct was achieved. The bile duct was of normal caliber when opacified and I could not discern any obvious leak. A small sphincterotomy was performed and then a 10 French 5 cm plastic stent was placed in the usual fashion with good drainage. The scope was then withdrawn and the patient returned to the recovery room in stable condition. She had some brief desaturation early in the procedure and some sinus tachycardia with pulse in the 120s but otherwi se tolerated the procedure well.  COMPLICATIONS:  No immediate complication ENDOSCOPIC IMPRESSION: Normal common bile duct but with previous radiologic evidence of bile leak, status post sphincterotomy and stent placement.  RECOMMENDATIONS: Will monitor for improvement in symptoms over time and will need stent removed in approximately 2 months.  ______________________________ Dorena Cookey  CC:  n. eSIGNED:   Dorena Cookey at 08/22/2011 12:24 PM  Elder Negus, [phone]

## 2011-08-22 NOTE — Progress Notes (Signed)
PATIENT DETAILS Name: [name] Age: 60 y.o. Sex: female Date of Birth: 07-18-1951 Admit Date: 08/21/2011 WUJ:[id number],NWGNF P, MD  Subjective: Still having some upper abdominal pain.  Objective: Vital signs in last 24 hours: Filed Vitals:   08/21/11 2238 08/22/11 0510 08/22/11 0515 08/22/11 1057  BP: 177/82 127/75 140/61 150/96  Pulse: 114 86 88   Temp: 98.3 F (36.8 C) 97.7 F (36.5 C) 98.3 F (36.8 C)   TempSrc: Oral     Resp: 18 18 19 24   Height:    5\' 4"  (1.626 m)  Weight:    74.844 kg (165 lb)  SpO2: 98% 92% 99% 94%    Weight change:   Body mass index is 28.32 kg/(m^2).  Intake/Output from previous day: No intake or output data in the 24 hours ending 08/22/11 1122  PHYSICAL EXAM: Gen Exam: Awake and alert with clear speech.   Neck: Supple, No JVD.   Chest: B/L Clear.   CVS: S1 S2 Regular, no murmurs.  Abdomen: soft, BS +, mildly diffusely tender, non distended.  Extremities: no edema, lower extremities warm to touch. Neurologic: Non Focal.  Skin: No Rash.   Wounds: N/A.    CONSULTS:  GI  LAB RESULTS: CBC  Lab 08/22/11 0550 08/21/11 2111  WBC 9.0 10.5  HGB 10.7* 11.5*  HCT 32.8* 35.4*  PLT 154 156  MCV 82.8 82.1  MCH 27.0 26.7  MCHC 32.6 32.5  RDW 14.3 14.2  LYMPHSABS -- --  MONOABS -- --  EOSABS -- --  BASOSABS -- --  BANDABS -- --    Chemistries   Lab 08/22/11 0550 08/21/11 2111  NA 141 142  K 3.2* 3.0*  CL 106 107  CO2 26 24  GLUCOSE 140* 127*  BUN 9 7  CREATININE 0.71 0.63  CALCIUM 9.4 9.5  MG -- 1.6    GFR Estimated Creatinine Clearance: 74.9 ml/min (by C-G formula based on Cr of 0.71).  Coagulation profile  Lab 08/21/11 2111  INR 1.36  PROTIME --    Cardiac Enzymes No results found for this basename: CK:3,CKMB:3,TROPONINI:3,MYOGLOBIN:3 in the last 168 hours  No components found with this basename: POCBNP:3 No results found for this basename: DDIMER:2 in the last 72 hours  Basename 08/21/11 2111  HGBA1C  6.5*    Basename 08/21/11 2116  CHOL 98  HDL 46  LDLCALC 39  TRIG 63  CHOLHDL 2.1  LDLDIRECT --    Basename 08/21/11 2111  TSH 3.191  T4TOTAL --  T3FREE --  THYROIDAB --   No results found for this basename: VITAMINB12:2,FOLATE:2,FERRITIN:2,TIBC:2,IRON:2,RETICCTPCT:2 in the last 72 hours No results found for this basename: LIPASE:2,AMYLASE:2 in the last 72 hours  Urine Studies No results found for this basename: UACOL:2,UAPR:2,USPG:2,UPH:2,UTP:2,UGL:2,UKET:2,UBIL:2,UHGB:2,UNIT:2,UROB:2,ULEU:2,UEPI:2,UWBC:2,URBC:2,UBAC:2,CAST:2,CRYS:2,UCOM:2,BILUA:2 in the last 72 hours  MICROBIOLOGY: No results found for this or any previous visit (from the past 240 hour(s)).  RADIOLOGY STUDIES/RESULTS: No results found.  MEDICATIONS: Scheduled Meds:   . calcium carbonate  1 tablet Oral Daily  . enoxaparin  40 mg Subcutaneous Q24H  . estrogens (conjugated)  0.9 mg Oral Daily  . insulin aspart  0-9 Units Subcutaneous TID WC  . metoprolol  50 mg Oral Daily  . pantoprazole  40 mg Oral Daily  . piperacillin-tazobactam (ZOSYN)  IV  3.375 g Intravenous Q8H  . potassium chloride  10 mEq Intravenous Q1 Hr x 2  . simvastatin  40 mg Oral q1800  . venlafaxine  150 mg Oral Daily  . vitamin B-12  1,000 mcg  Oral Daily  . vitamin C  1,000 mg Oral Daily  . zolpidem  10 mg Oral QHS  . DISCONTD: calcium carbonate  600 mg Oral Daily  . DISCONTD: estrogens conjugated (synthetic A)  0.9 mg Oral Daily  . DISCONTD: metFORMIN  500 mg Oral Q breakfast  . DISCONTD: piperacillin-tazobactam (ZOSYN)  IV  2.25 g Intravenous Q6H  . DISCONTD: simvastatin  20 mg Oral Daily  . DISCONTD: venlafaxine  150 mg Oral Daily   Continuous Infusions:   . sodium chloride 75 mL/hr at 08/21/11 2253   PRN Meds:.morphine, ondansetron (ZOFRAN) IV, ondansetron, oxyCODONE, polyethylene glycol  Antibiotics: Anti-infectives     Start     Dose/Rate Route Frequency Ordered Stop   08/22/11 0000   piperacillin-tazobactam (ZOSYN)  IVPB 2.25 g  Status:  Discontinued        2.25 g 100 mL/hr over 30 Minutes Intravenous 4 times per day 08/21/11 2154 08/21/11 2202   08/21/11 2300  piperacillin-tazobactam (ZOSYN) IVPB 3.375 g       3.375 g 12.5 mL/hr over 240 Minutes Intravenous 3 times per day 08/21/11 2203            Assessment/Plan: Patient Active Hospital Problem List: Biliary Leak   Assessment: This is after laparoscopic cholecystectomy. Apparently this was seen on a hepatobiliary study done in [facility].    Plan: Plans for ERCP with stenting today. Continue with empiric Zosyn. Patient is afebrile and does not look toxic.   Hyperlipidemia    Assessment: Stable, no indications to check a lipid panel this admission.    Plan: Continue with statin.   GERD (gastroesophageal reflux disease)    Assessment: Stable. Her pain is thought to be secondary to biliary leak, and not from GERD.    Plan: Continue with narcotics.   Hypertension    Assessment: Controlled    Plan: Continue with metoprolol.   Diabetes mellitus    Assessment: CBGs stable.    Plan: Stop metformin while inpatient, place on SSI. Can resume metformin on discharge.   Hypokalemia    Assessment:. Possibly from GI loss from nausea and vomiting.    Plan: Will replete potassium and recheck electrolytes tomorrow.   Tachycardia    Assessment: Resolved. Likely secondary to dehydration.    Plan: Monitor periodically.   Disposition: Remain inpatient  DVT Prophylaxis: Subcutaneous Lovenox  Code Status: Full code  Maretta Bees, MD. 08/22/2011, 11:22 AM

## 2011-08-23 ENCOUNTER — Inpatient Hospital Stay (HOSPITAL_COMMUNITY): Payer: BC Managed Care – PPO

## 2011-08-23 ENCOUNTER — Encounter (HOSPITAL_COMMUNITY): Payer: Self-pay | Admitting: Gastroenterology

## 2011-08-23 LAB — BASIC METABOLIC PANEL
BUN: 8 mg/dL (ref 6–23)
Chloride: 105 mEq/L (ref 96–112)
Glucose, Bld: 85 mg/dL (ref 70–99)
Potassium: 2.9 mEq/L — ABNORMAL LOW (ref 3.5–5.1)

## 2011-08-23 LAB — COMPREHENSIVE METABOLIC PANEL
ALT: 13 U/L (ref 0–35)
AST: 21 U/L (ref 0–37)
Albumin: 2.5 g/dL — ABNORMAL LOW (ref 3.5–5.2)
CO2: 27 mEq/L (ref 19–32)
Calcium: 9 mg/dL (ref 8.4–10.5)
Sodium: 143 mEq/L (ref 135–145)
Total Protein: 5.9 g/dL — ABNORMAL LOW (ref 6.0–8.3)

## 2011-08-23 LAB — CBC
HCT: 32.1 % — ABNORMAL LOW (ref 36.0–46.0)
Hemoglobin: 10.3 g/dL — ABNORMAL LOW (ref 12.0–15.0)
MCH: 26.9 pg (ref 26.0–34.0)
Platelets: 130 10*3/uL — ABNORMAL LOW (ref 150–400)
RBC: 3.68 MIL/uL — ABNORMAL LOW (ref 3.87–5.11)
RBC: 3.86 MIL/uL — ABNORMAL LOW (ref 3.87–5.11)
WBC: 6.1 10*3/uL (ref 4.0–10.5)

## 2011-08-23 LAB — GLUCOSE, CAPILLARY
Glucose-Capillary: 87 mg/dL (ref 70–99)
Glucose-Capillary: 92 mg/dL (ref 70–99)

## 2011-08-23 MED ORDER — POTASSIUM CHLORIDE 10 MEQ/100ML IV SOLN
10.0000 meq | INTRAVENOUS | Status: AC
  Administered 2011-08-23 – 2011-08-24 (×2): 10 meq via INTRAVENOUS
  Filled 2011-08-23: qty 100

## 2011-08-23 MED ORDER — PROMETHAZINE HCL 25 MG/ML IJ SOLN
12.5000 mg | Freq: Four times a day (QID) | INTRAMUSCULAR | Status: DC | PRN
  Administered 2011-08-23: 25 mg via INTRAVENOUS
  Filled 2011-08-23: qty 1

## 2011-08-23 MED ORDER — POTASSIUM CHLORIDE CRYS ER 20 MEQ PO TBCR
60.0000 meq | EXTENDED_RELEASE_TABLET | Freq: Two times a day (BID) | ORAL | Status: AC
  Administered 2011-08-24: 60 meq via ORAL
  Filled 2011-08-23 (×3): qty 3

## 2011-08-23 MED ORDER — POTASSIUM CHLORIDE 10 MEQ/100ML IV SOLN
10.0000 meq | INTRAVENOUS | Status: AC
  Administered 2011-08-23: 10 meq via INTRAVENOUS
  Filled 2011-08-23 (×2): qty 100

## 2011-08-23 NOTE — Progress Notes (Signed)
PATIENT DETAILS Name: [name] Age: 60 y.o. Sex: female Date of Birth: March 02, 1951 Admit Date: 08/21/2011 ZOX:WRUEAV,WUJWJ P, MD  Subjective: Complaining about abdominal pain. While I am interviewing her she had bilious vomiting  Objective: Vital signs in last 24 hours: Filed Vitals:   08/22/11 1346 08/22/11 1804 08/22/11 2110 08/23/11 0515  BP: 126/75 130/80 122/60 153/79  Pulse: 100 80 79 97  Temp: 97.8 F (36.6 C)  98.3 F (36.8 C) 98 F (36.7 C)  TempSrc: Oral     Resp: 18  18 18   Height:      Weight:      SpO2: 94%  96% 97%    Weight change: -6.757 kg (-14 lb 14.3 oz)  Body mass index is 28.32 kg/(m^2).  Intake/Output from previous day:  Intake/Output Summary (Last 24 hours) at 08/23/11 1240 Last data filed at 08/23/11 0500  Gross per 24 hour  Intake    360 ml  Output      2 ml  Net    358 ml    PHYSICAL EXAM: Gen Exam: Awake and alert with clear speech.   Neck: Supple, No JVD.   Chest: B/L Clear.   CVS: S1 S2 Regular, no murmurs.  Abdomen: soft, BS +, mildly diffusely tender, non distended.  Extremities: no edema, lower extremities warm to touch. Neurologic: Non Focal.  Skin: No Rash.   Wounds: N/[name].    CONSULTS:  GI  LAB RESULTS: CBC  Lab 08/23/11 0842 08/23/11 0600 08/22/11 0550 08/21/11 2111  WBC 6.1 5.7 9.0 10.5  HGB 10.3* 9.9* 10.7* 11.5*  HCT 32.1* 30.7* 32.8* 35.4*  PLT 144* 130* 154 156  MCV 83.2 83.4 82.8 82.1  MCH 26.7 26.9 27.0 26.7  MCHC 32.1 32.2 32.6 32.5  RDW 14.1 14.1 14.3 14.2  LYMPHSABS -- -- -- --  MONOABS -- -- -- --  EOSABS -- -- -- --  BASOSABS -- -- -- --  BANDABS -- -- -- --    Chemistries   Lab 08/23/11 0600 08/22/11 0550 08/21/11 2111  NA 143 141 142  K 2.7* 3.2* 3.0*  CL 106 106 107  CO2 27 26 24   GLUCOSE 97 140* 127*  BUN 9 9 7   CREATININE 0.62 0.71 0.63  CALCIUM 9.0 9.4 9.5  MG -- -- 1.6   RADIOLOGY STUDIES/RESULTS: No results found.  MEDICATIONS: Scheduled Meds:    . calcium carbonate   1 tablet Oral Daily  . enoxaparin  40 mg Subcutaneous Q24H  . estrogens (conjugated)  0.9 mg Oral Daily  . insulin aspart  0-9 Units Subcutaneous TID WC  . metoprolol  50 mg Oral Daily  . pantoprazole  40 mg Oral Daily  . piperacillin-tazobactam (ZOSYN)  IV  3.375 g Intravenous Q8H  . potassium chloride  10 mEq Intravenous Q1 Hr x 2  . potassium chloride  10 mEq Intravenous Q1 Hr x 4  . potassium chloride  60 mEq Oral BID  . simvastatin  40 mg Oral q1800  . venlafaxine  150 mg Oral Daily  . vitamin B-12  1,000 mcg Oral Daily  . vitamin C  1,000 mg Oral Daily  . zolpidem  10 mg Oral QHS   Continuous Infusions:    . sodium chloride 75 mL/hr at 08/23/11 0537   PRN Meds:.morphine, ondansetron (ZOFRAN) IV, ondansetron, oxyCODONE, polyethylene glycol  Antibiotics: Anti-infectives     Start     Dose/Rate Route Frequency Ordered Stop   08/22/11 0000   piperacillin-tazobactam (ZOSYN)  IVPB 2.25 g  Status:  Discontinued        2.25 g 100 mL/hr over 30 Minutes Intravenous 4 times per day 08/21/11 2154 08/21/11 2202   08/21/11 2300  piperacillin-tazobactam (ZOSYN) IVPB 3.375 g       3.375 g 12.5 mL/hr over 240 Minutes Intravenous 3 times per day 08/21/11 2203            Assessment/Plan: Patient Active Hospital Problem List: Biliary Leak   Assessment: This is after laparoscopic cholecystectomy. Apparently this was seen on [name] hepatobiliary study done in [facility].    Plan: S/P ERCP and stenting done by Dr Madilyn Fireman. She has nausea and vomiting, will check KUB and has low threshold for further imaging with Ct scan if the N/V continues. Continue Zosyn.  Hypokalemia    Assessment:. Possibly from GI loss from nausea and vomiting.    Plan: Will replete potassium aggressively and recheck electrolytes tomorrow.   Hyperlipidemia    Assessment: Stable, no indications to check [name] lipid panel this admission.    Plan: Continue with statin.   GERD (gastroesophageal reflux disease)     Assessment: Stable. Her pain is thought to be secondary to biliary leak, and not from GERD.    Plan: Continue with narcotics.   Hypertension    Assessment: Controlled    Plan: Continue with metoprolol.   Diabetes mellitus    Assessment: CBGs stable.    Plan: Stop metformin while inpatient, place on SSI. Can resume metformin on discharge.   Tachycardia    Assessment: Resolved. Likely secondary to dehydration.    Plan: Monitor periodically.   Disposition: Remain inpatient  DVT Prophylaxis: Subcutaneous Lovenox  Code Status: Full code  [name] 08/23/2011, 12:40 PM

## 2011-08-23 NOTE — Progress Notes (Signed)
Notified md re pts poor po intake and kcl of 2.7.

## 2011-08-23 NOTE — Progress Notes (Signed)
[facility] Progress Note  Subjective: Abdominal pain overall improved still a slight nausea  Objective: Vital signs in last 24 hours: Temp:  [97.8 F (36.6 C)-98.3 F (36.8 C)] 98 F (36.7 C) (12/26 0515) Pulse Rate:  [79-100] 97  (12/26 0515) Resp:  [14-24] 18  (12/26 0515) BP: (122-198)/(60-116) 153/79 mmHg (12/26 0515) SpO2:  [93 %-98 %] 97 % (12/26 0515) Weight:  [74.844 kg (165 lb)] 165 lb (74.844 kg) (12/25 1057) Weight change: -6.757 kg (-14 lb 14.3 oz)   PE: Abdomen soft less tender  Lab Results: Results for orders placed during the hospital encounter of 08/21/11 (from the past 24 hour(s))  GLUCOSE, CAPILLARY     Status: Abnormal   Collection Time   08/22/11  8:58 AM      Component Value Range   Glucose-Capillary 124 (*) 70 - 99 (mg/dL)   Comment 1 Documented in Chart     Comment 2 Notify RN    GLUCOSE, CAPILLARY     Status: Abnormal   Collection Time   08/22/11 11:06 AM      Component Value Range   Glucose-Capillary 130 (*) 70 - 99 (mg/dL)   Comment 1 Call MD NNP PA CNM    GLUCOSE, CAPILLARY     Status: Abnormal   Collection Time   08/22/11  1:33 PM      Component Value Range   Glucose-Capillary 134 (*) 70 - 99 (mg/dL)   Comment 1 Documented in Chart     Comment 2 Notify RN    GLUCOSE, CAPILLARY     Status: Abnormal   Collection Time   08/22/11  4:42 PM      Component Value Range   Glucose-Capillary 100 (*) 70 - 99 (mg/dL)   Comment 1 Notify RN    GLUCOSE, CAPILLARY     Status: Abnormal   Collection Time   08/22/11 10:28 PM      Component Value Range   Glucose-Capillary 115 (*) 70 - 99 (mg/dL)  CBC     Status: Abnormal   Collection Time   08/23/11  6:00 AM      Component Value Range   WBC 5.7  4.0 - 10.5 (K/uL)   RBC 3.68 (*) 3.87 - 5.11 (MIL/uL)   Hemoglobin 9.9 (*) 12.0 - 15.0 (g/dL)   HCT 16.1 (*) 09.6 - 46.0 (%)   MCV 83.4  78.0 - 100.0 (fL)   MCH 26.9  26.0 - 34.0 (pg)   MCHC 32.2  30.0 - 36.0 (g/dL)   RDW 04.5  40.9 - 81.1  (%)   Platelets 130 (*) 150 - 400 (K/uL)  COMPREHENSIVE METABOLIC PANEL     Status: Abnormal   Collection Time   08/23/11  6:00 AM      Component Value Range   Sodium 143  135 - 145 (mEq/L)   Potassium 2.7 (*) 3.5 - 5.1 (mEq/L)   Chloride 106  96 - 112 (mEq/L)   CO2 27  19 - 32 (mEq/L)   Glucose, Bld 97  70 - 99 (mg/dL)   BUN 9  6 - 23 (mg/dL)   Creatinine, Ser 9.14  0.50 - 1.10 (mg/dL)   Calcium 9.0  8.4 - 78.2 (mg/dL)   Total Protein 5.9 (*) 6.0 - 8.3 (g/dL)   Albumin 2.5 (*) 3.5 - 5.2 (g/dL)   AST 21  0 - 37 (U/L)   ALT 13  0 - 35 (U/L)   Alkaline Phosphatase 132 (*) 39 - 117 (U/L)  Total Bilirubin 0.8  0.3 - 1.2 (mg/dL)   GFR calc non Af Amer >90  >90 (mL/min)   GFR calc Af Amer >90  >90 (mL/min)  GLUCOSE, CAPILLARY     Status: Abnormal   Collection Time   08/23/11  7:44 AM      Component Value Range   Glucose-Capillary 101 (*) 70 - 99 (mg/dL)   Comment 1 Notify RN      Studies/Results: Dg Ercp With Sphincterotomy  08/22/2011  *RADIOLOGY REPORT*  Clinical Data: Status post cholecystectomy.  Postop bile leak and biloma.  ERCP  Comparison:  None.  Technique:  Multiple spot images obtained with the fluoroscopic device and submitted for interpretation post-procedure.  ERCP was performed by Dr. Madilyn Fireman.  Findings: Fluoroscopic spot images show no evidence of biliary ductal dilatation.  No obstructing calculi seen within the common bile duct.  The final image shows placement of a stent within the distal common bile duct, with contrast emptying from the biliary tree.  IMPRESSION: Successful placement of internal common bile duct stent.  No evidence of biliary obstruction.  These images were submitted for radiologic interpretation only. Please see the procedural report for the amount of contrast and the fluoroscopy time utilized.  Original Report Authenticated By: Danae Orleans, M.D.      Assessment: Bile leak after cholecystectomy, status post endoscopic stenting  Plan: Advance  diet slowly, observe in hospital 1 more day. Will need outpatient followup for eventual removal of stent.    Grainger Mccarley C 08/23/2011, 8:43 AM

## 2011-08-24 LAB — COMPREHENSIVE METABOLIC PANEL
AST: 20 U/L (ref 0–37)
BUN: 5 mg/dL — ABNORMAL LOW (ref 6–23)
CO2: 26 mEq/L (ref 19–32)
Calcium: 8.6 mg/dL (ref 8.4–10.5)
Creatinine, Ser: 0.55 mg/dL (ref 0.50–1.10)
GFR calc Af Amer: >90 mL/min (ref >90)
GFR calc non Af Amer: >90 mL/min (ref >90)
Glucose, Bld: 106 mg/dL — ABNORMAL HIGH (ref 70–99)
Total Bilirubin: 0.7 mg/dL (ref 0.3–1.2)

## 2011-08-24 LAB — GLUCOSE, CAPILLARY
Glucose-Capillary: 93 mg/dL (ref 70–99)
Glucose-Capillary: 91 mg/dL (ref 70–99)

## 2011-08-24 MED ORDER — POTASSIUM CHLORIDE CRYS ER 20 MEQ PO TBCR
60.0000 meq | EXTENDED_RELEASE_TABLET | Freq: Three times a day (TID) | ORAL | Status: DC
  Administered 2011-08-24: 60 meq via ORAL
  Filled 2011-08-24 (×4): qty 3

## 2011-08-24 MED ORDER — POTASSIUM CHLORIDE 20 MEQ/15ML (10%) PO LIQD
60.0000 meq | Freq: Three times a day (TID) | ORAL | Status: DC
  Administered 2011-08-24 – 2011-08-25 (×3): 60 meq via ORAL
  Filled 2011-08-24 (×5): qty 45

## 2011-08-24 MED ORDER — SODIUM CHLORIDE 0.9 % IV SOLN
INTRAVENOUS | Status: DC
  Administered 2011-08-24 – 2011-08-26 (×4): via INTRAVENOUS
  Filled 2011-08-24 (×6): qty 1000

## 2011-08-24 NOTE — Progress Notes (Signed)
PATIENT DETAILS Name: [name] Age: 60 y.o. Sex: female Date of Birth: 1951-06-22 Admit Date: 08/21/2011 ZOX:WRUEAV,WUJWJ P, MD  Subjective: Nausea improved since yesterday. Denies any vomiting this morning she still have slight nausea but tolerated regular diet.  Objective: Vital signs in last 24 hours: Filed Vitals:   08/23/11 2242 08/24/11 0048 08/24/11 0536 08/24/11 1015  BP: 139/78 141/73 140/77 136/87  Pulse: 91 95 84 87  Temp: 98.2 F (36.8 C) 98 F (36.7 C) 97.9 F (36.6 C) 97.6 F (36.4 C)  TempSrc: Oral Oral Oral   Resp: 16 20 18 20   Height:      Weight:      SpO2: 97% 97% 96% 100%    Weight change:   Body mass index is 28.32 kg/(m^2).  Intake/Output from previous day:  Intake/Output Summary (Last 24 hours) at 08/24/11 1320 Last data filed at 08/24/11 1100  Gross per 24 hour  Intake   1480 ml  Output    401 ml  Net   1079 ml    PHYSICAL EXAM: Gen Exam: Awake and alert with clear speech.   Neck: Supple, No JVD.   Chest: B/L Clear.   CVS: S1 S2 Regular, no murmurs.  Abdomen: soft, BS +, mildly diffusely tender, non distended.  Extremities: no edema, lower extremities warm to touch. Neurologic: Non Focal.  Skin: No Rash.   Wounds: N/A.    CONSULTS:  GI  LAB RESULTS: CBC  Lab 08/23/11 0842 08/23/11 0600 08/22/11 0550 08/21/11 2111  WBC 6.1 5.7 9.0 10.5  HGB 10.3* 9.9* 10.7* 11.5*  HCT 32.1* 30.7* 32.8* 35.4*  PLT 144* 130* 154 156  MCV 83.2 83.4 82.8 82.1  MCH 26.7 26.9 27.0 26.7  MCHC 32.1 32.2 32.6 32.5  RDW 14.1 14.1 14.3 14.2  LYMPHSABS -- -- -- --  MONOABS -- -- -- --  EOSABS -- -- -- --  BASOSABS -- -- -- --  BANDABS -- -- -- --    Chemistries   Lab 08/24/11 0505 08/23/11 1508 08/23/11 0600 08/22/11 0550 08/21/11 2111  NA 138 141 143 141 142  K 2.5* 2.9* 2.7* 3.2* 3.0*  CL 103 105 106 106 107  CO2 26 26 27 26 24   GLUCOSE 106* 85 97 140* 127*  BUN 5* 8 9 9 7   CREATININE 0.55 0.55 0.62 0.71 0.63  CALCIUM 8.6 8.8 9.0  9.4 9.5  MG -- -- -- -- 1.6   RADIOLOGY STUDIES/RESULTS: No results found.  MEDICATIONS: Scheduled Meds:    . calcium carbonate  1 tablet Oral Daily  . enoxaparin  40 mg Subcutaneous Q24H  . estrogens (conjugated)  0.9 mg Oral Daily  . insulin aspart  0-9 Units Subcutaneous TID WC  . metoprolol  50 mg Oral Daily  . pantoprazole  40 mg Oral Daily  . piperacillin-tazobactam (ZOSYN)  IV  3.375 g Intravenous Q8H  . potassium chloride  10 mEq Intravenous Q1 Hr x 2  . potassium chloride  10 mEq Intravenous Q1 Hr x 2  . potassium chloride  60 mEq Oral BID  . potassium chloride  60 mEq Oral TID  . simvastatin  40 mg Oral q1800  . venlafaxine  150 mg Oral Daily  . vitamin B-12  1,000 mcg Oral Daily  . vitamin C  1,000 mg Oral Daily  . zolpidem  10 mg Oral QHS   Continuous Infusions:    . sodium chloride 0.9 % 1,000 mL with potassium chloride 40 mEq infusion 75  mL/hr at 08/24/11 1032  . DISCONTD: sodium chloride 1,000 mL (08/24/11 0748)   PRN Meds:.morphine, ondansetron (ZOFRAN) IV, ondansetron, oxyCODONE, polyethylene glycol, promethazine  Antibiotics: Anti-infectives     Start     Dose/Rate Route Frequency Ordered Stop   08/22/11 0000   piperacillin-tazobactam (ZOSYN) IVPB 2.25 g  Status:  Discontinued        2.25 g 100 mL/hr over 30 Minutes Intravenous 4 times per day 08/21/11 2154 08/21/11 2202   08/21/11 2300  piperacillin-tazobactam (ZOSYN) IVPB 3.375 g       3.375 g 12.5 mL/hr over 240 Minutes Intravenous 3 times per day 08/21/11 2203            Assessment/Plan: Patient Active Hospital Problem List: Biliary Leak   Assessment: This is after laparoscopic cholecystectomy. Apparently this was seen on a hepatobiliary study done in [facility].    Plan: S/P ERCP and stenting done by Dr Madilyn Fireman. She has nausea and vomiting, will check KUB, this is showed some free air under diaphragm. Which is expected after laparoscopic surgery. Patient is much better today with  slight nausea and started to tolerate solid food. She is on Zosyn for her biliary leak all continue on that.  Hypokalemia    Assessment:. Possibly from GI loss from nausea and vomiting.    Plan: I try to replace potassium yesterday but patient did have some vomiting so the oral supplementation were not working. All of your IV potassium as well as the oral.   Hyperlipidemia    Assessment: Stable, no indications to check a lipid panel this admission.    Plan: Continue with statin.   GERD (gastroesophageal reflux disease)    Assessment: Stable. Her pain is thought to be secondary to biliary leak, and not from GERD.    Plan: Continue with narcotics.   Hypertension    Assessment: Controlled    Plan: Continue with metoprolol.   Diabetes mellitus    Assessment: CBGs stable.    Plan: Stop metformin while inpatient, place on SSI. Can resume metformin on discharge.   Tachycardia    Assessment: Resolved. Likely secondary to dehydration.    Plan: Monitor periodically.   Disposition: Remain inpatient, if continue to improve can be discharged in the morning.  DVT Prophylaxis: Subcutaneous Lovenox  Code Status: Full code  Sigurd Pugh A 08/24/2011, 1:20 PM

## 2011-08-24 NOTE — Progress Notes (Signed)
[facility] Progress Note  Subjective: Patient experienced bilious vomiting yesterday with some abdominal pain and tenderness. KUB was ordered which showed a normal bowel gas pattern with some free air under the diaphragm, this is felt to likely be postsurgical given her recent laparoscopic cholecystectomy. Today she feels better although she still is slightly nauseated but tolerated a regular diet last night and this morning.  Objective: Vital signs in last 24 hours: Temp:  [97.5 F (36.4 C)-98.2 F (36.8 C)] 97.9 F (36.6 C) (12/27 0536) Pulse Rate:  [84-95] 84  (12/27 0536) Resp:  [16-20] 18  (12/27 0536) BP: (134-141)/(72-78) 140/77 mmHg (12/27 0536) SpO2:  [96 %-97 %] 96 % (12/27 0536) Weight change:    PE: Abdomen soft nondistended no significant tenderness and no rebound.  Lab Results: Results for orders placed during the hospital encounter of 08/21/11 (from the past 24 hour(s))  GLUCOSE, CAPILLARY     Status: Abnormal   Collection Time   08/23/11 12:14 PM      Component Value Range   Glucose-Capillary 102 (*) 70 - 99 (mg/dL)   Comment 1 Notify RN    BASIC METABOLIC PANEL     Status: Abnormal   Collection Time   08/23/11  3:08 PM      Component Value Range   Sodium 141  135 - 145 (mEq/L)   Potassium 2.9 (*) 3.5 - 5.1 (mEq/L)   Chloride 105  96 - 112 (mEq/L)   CO2 26  19 - 32 (mEq/L)   Glucose, Bld 85  70 - 99 (mg/dL)   BUN 8  6 - 23 (mg/dL)   Creatinine, Ser 1.61  0.50 - 1.10 (mg/dL)   Calcium 8.8  8.4 - 09.6 (mg/dL)   GFR calc non Af Amer >90  >90 (mL/min)   GFR calc Af Amer >90  >90 (mL/min)  GLUCOSE, CAPILLARY     Status: Normal   Collection Time   08/23/11  5:12 PM      Component Value Range   Glucose-Capillary 87  70 - 99 (mg/dL)   Comment 1 Notify RN    GLUCOSE, CAPILLARY     Status: Normal   Collection Time   08/23/11 10:42 PM      Component Value Range   Glucose-Capillary 92  70 - 99 (mg/dL)  COMPREHENSIVE METABOLIC PANEL     Status:  Abnormal   Collection Time   08/24/11  5:05 AM      Component Value Range   Sodium 138  135 - 145 (mEq/L)   Potassium 2.5 (*) 3.5 - 5.1 (mEq/L)   Chloride 103  96 - 112 (mEq/L)   CO2 26  19 - 32 (mEq/L)   Glucose, Bld 106 (*) 70 - 99 (mg/dL)   BUN 5 (*) 6 - 23 (mg/dL)   Creatinine, Ser 0.45  0.50 - 1.10 (mg/dL)   Calcium 8.6  8.4 - 40.9 (mg/dL)   Total Protein 5.7 (*) 6.0 - 8.3 (g/dL)   Albumin 2.5 (*) 3.5 - 5.2 (g/dL)   AST 20  0 - 37 (U/L)   ALT 11  0 - 35 (U/L)   Alkaline Phosphatase 137 (*) 39 - 117 (U/L)   Total Bilirubin 0.7  0.3 - 1.2 (mg/dL)   GFR calc non Af Amer >90  >90 (mL/min)   GFR calc Af Amer >90  >90 (mL/min)  GLUCOSE, CAPILLARY     Status: Normal   Collection Time   08/24/11  7:58 AM  Component Value Range   Glucose-Capillary 99  70 - 99 (mg/dL)   Comment 1 Notify RN      Studies/Results: Dg Ercp With Sphincterotomy  08/22/2011  *RADIOLOGY REPORT*  Clinical Data: Status post cholecystectomy.  Postop bile leak and biloma.  ERCP  Comparison:  None.  Technique:  Multiple spot images obtained with the fluoroscopic device and submitted for interpretation post-procedure.  ERCP was performed by Dr. Madilyn Fireman.  Findings: Fluoroscopic spot images show no evidence of biliary ductal dilatation.  No obstructing calculi seen within the common bile duct.  The final image shows placement of a stent within the distal common bile duct, with contrast emptying from the biliary tree.  IMPRESSION: Successful placement of internal common bile duct stent.  No evidence of biliary obstruction.  These images were submitted for radiologic interpretation only. Please see the procedural report for the amount of contrast and the fluoroscopy time utilized.  Original Report Authenticated By: Danae Orleans, M.D.   Dg Abd 2 Views  08/23/2011  *RADIOLOGY REPORT*  Clinical Data: Nausea/vomiting status post ERCP dated 08/22/2011  ABDOMEN - 2 VIEW  Comparison: Intraoperative cholangiogram dated  08/22/2011  Findings: Nonobstructive bowel gas pattern.  Free air under the diaphragm on the upright view.  Internal biliary stent.  Cholecystectomy clips.  Visualized osseous structures are within normal limits.  IMPRESSION: Free air under the diaphragm on the upright view, likely postsurgical given recent cholecystectomy, follow-up radiographs are suggested to document resolution.  Original Report Authenticated By: Charline Bills, M.D.      Assessment: Bile leak after cholecystectomy, status post biliary stenting. I agree that the free air in the diaphragm is likely from recent cholecystectomy.  Plan: Continue to monitor her overnight her tolerance of diet and clinical parameters and we'll recheck white blood cell count and liver function test tomorrow. If she does well clinically can possibly go home tomorrow. Will need followup with me for eventual stent removal.    Tyneshia Stivers C 08/24/2011, 10:08 AM

## 2011-08-24 NOTE — Progress Notes (Signed)
   CARE MANAGEMENT NOTE 08/24/2011  Patient:  [name], [name]   Account Number:  [id number]  Date Initiated:  08/24/2011  Documentation initiated by:  Donn Pierini  Subjective/Objective Assessment:   Pt admitted with abd pain, s/p ERCP with stent     Action/Plan:   PTA lived at home with spouse, was independent with ADLS   Anticipated DC Date:  08/25/2011   Anticipated DC Plan:  HOME/SELF CARE      DC Planning Services  CM consult      Choice offered to / List presented to:             Status of service:  In process, will continue to follow Medicare Important Message given?   (If response is "NO", the following Medicare IM given date fields will be blank) Date Medicare IM given:   Date Additional Medicare IM given:    Discharge Disposition:    Per UR Regulation:    Comments:  PCP- Selinda Flavin  08/24/11- 1200- Donn Pierini RN, BSN [phone] Plan is to discharge home when able to tolerate diet,  no recommendations from PT. CM will follow

## 2011-08-24 NOTE — Progress Notes (Signed)
Physical Therapy Evaluation Patient Details Name: [name] MRN: [id number] DOB: 01-Mar-1951 Today's Date: 08/24/2011  Problem List:  Patient Active Problem List  Diagnoses  . Hyperlipidemia  . GERD (gastroesophageal reflux disease)  . Hypertension  . Diabetes mellitus  . Hypokalemia  . Tachycardia  . Biliary sludge    Past Medical History:  Past Medical History  Diagnosis Date  . Hydronephrosis   . Hyperlipidemia   . GERD (gastroesophageal reflux disease)   . Diabetes mellitus   . Hypertension   . Depression   . Headache   . Arthritis     fingers   Past Surgical History:  Past Surgical History  Procedure Date  . Ureteroscopy  4/12, 2/12, 3/10    X 3 SURGERIES  FOR KIDNEY STONES  . Cystoscopy     X 3  . Laparoscopic assisted vaginal hysterectomy 07/13/2011    Procedure: LAPAROSCOPIC ASSISTED VAGINAL HYSTERECTOMY;  Surgeon: Melony Overly;  Location: [facility];  Service: Gynecology;  Laterality: N/A;  . Salpingoophorectomy 07/13/2011    Procedure: SALPINGO OOPHERECTOMY;  Surgeon: Melony Overly;  Location: [facility];  Service: Gynecology;  Laterality: Bilateral;  . Anterior and posterior repair 07/13/2011    Procedure: ANTERIOR (CYSTOCELE) AND POSTERIOR REPAIR (RECTOCELE);  Surgeon: Melony Overly;  Location: [facility];  Service: Gynecology;  Laterality: N/A;  . Bladder suspension 07/13/2011    Procedure: TRANSVAGINAL TAPE (TVT) PROCEDURE;  Surgeon: Melony Overly;  Location: [facility];  Service: Gynecology;  Laterality: N/A;  . Cystoscopy 07/13/2011    Procedure: CYSTOSCOPY;  Surgeon: Melony Overly;  Location: [facility];  Service: Gynecology;  Laterality: N/A;  . Cholecystectomy   . Ercp 08/22/2011    Procedure: ENDOSCOPIC RETROGRADE CHOLANGIOPANCREATOGRAPHY (ERCP);  Surgeon: Barrie Folk, MD;  Location: [facility] ENDOSCOPY;  Service: Endoscopy;  Laterality: N/A;  . Biliary stent placement 08/22/2011    Procedure: BILIARY STENT PLACEMENT;  Surgeon: Barrie Folk, MD;  Location: [facility]  ENDOSCOPY;  Service: Endoscopy;  Laterality: N/A;  69f x 5cm stent for bile leak. placed in CBD    PT Assessment/Plan/Recommendation PT Assessment Clinical Impression Statement: Pt present with mild instability when ambulating with no assistive device.   PT Recommendation/Assessment: Patient will need skilled PT in the acute care venue PT Problem List: Decreased balance Barriers to Discharge: None PT Therapy Diagnosis : Difficulty walking PT Plan PT Frequency: Min 3X/week PT Treatment/Interventions: Gait training;Balance training;Therapeutic activities;Patient/family education PT Recommendation Follow Up Recommendations: None Equipment Recommended: Other (comment) (to be determined based on pt's progress) PT Goals  Acute Rehab PT Goals PT Goal Formulation: With patient Time For Goal Achievement: 7 days Pt will Ambulate: >150 feet;Independently;with least restrictive assistive device PT Goal: Ambulate - Progress: Not met Pt will Go Up / Down Stairs: 3-5 stairs;Independently;with least restrictive assistive device PT Goal: Up/Down Stairs - Progress: Not met Additional Goals Additional Goal #1: Perform standard balance test within safe limits.   PT Goal: Additional Goal #1 - Progress: Not met  PT Evaluation Precautions/Restrictions  Precautions Precautions: Fall Restrictions Weight Bearing Restrictions: No Prior Functioning  Home Living Lives With: Spouse Receives Help From: Family Type of Home: House Home Layout: One level Home Access: Stairs to enter Entrance Stairs-Rails: None Entrance Stairs-Number of Steps: 2 Bathroom Shower/Tub: Psychologist, counselling;Door [employer] Toilet: Handicapped height Bathroom Accessibility: Yes How Accessible: Accessible via wheelchair;Accessible via walker Home Adaptive Equipment: Built-in shower seat Prior Function Level of Independence: Independent with basic ADLs;Independent with homemaking with ambulation;Independent with gait;Needs assistance  with tranfers Able to Take Stairs?: Yes Driving: Yes Vocation: Full time employment Cognition Cognition Arousal/Alertness: Awake/alert Overall Cognitive Status: Appears within functional limits for tasks assessed Orientation Level: Oriented X4 Sensation/Coordination Sensation Light Touch: Appears Intact Coordination Gross Motor Movements are Fluid and Coordinated: Yes Extremity Assessment RUE Assessment RUE Assessment: Within Functional Limits LUE Assessment LUE Assessment: Within Functional Limits RLE Assessment RLE Assessment: Within Functional Limits LLE Assessment LLE Assessment: Within Functional Limits Mobility (including Balance) Bed Mobility Bed Mobility: Yes Transfers Transfers: Yes Ambulation/Gait Ambulation/Gait: Yes Ambulation/Gait Assistance: 4: Min assist Ambulation/Gait Assistance Details (indicate cue type and reason): Assistance secondary to LOB x 3 during gait training. Gait is slow and tunk is ridgid during ambulation Ambulation Distance (Feet): 150 Feet Assistive device: None Gait Pattern: Within Functional Limits Stairs: No Wheelchair Mobility Wheelchair Mobility: No  Posture/Postural Control Posture/Postural Control: No significant limitations Balance Balance Assessed: Yes Static Sitting Balance Static Sitting - Balance Support: No upper extremity supported Static Sitting - Level of Assistance: 7: Independent Static Sitting - Comment/# of Minutes: 5+ min with no LOB Exercise    End of Session PT - End of Session Equipment Utilized During Treatment: Gait belt Activity Tolerance: Patient tolerated treatment well Patient left: in bed;with call bell in reach;with family/visitor present Nurse Communication: Mobility status for transfers;Mobility status for ambulation General Behavior During Session: [facility] for tasks performed Cognition: [facility] for tasks performed  Lindsey Hommel L. Kemal Amores DPT [phone] Alferd Apa 08/24/2011, 9:23 AM

## 2011-08-25 LAB — CBC
HCT: 30.8 % — ABNORMAL LOW (ref 36.0–46.0)
HCT: 34 % — ABNORMAL LOW (ref 36.0–46.0)
Hemoglobin: 10.3 g/dL — ABNORMAL LOW (ref 12.0–15.0)
Hemoglobin: 11.3 g/dL — ABNORMAL LOW (ref 12.0–15.0)
MCH: 26.9 pg (ref 26.0–34.0)
MCHC: 33.4 g/dL (ref 30.0–36.0)
MCV: 80.4 fL (ref 78.0–100.0)
MCV: 81.7 fL (ref 78.0–100.0)
RDW: 13.6 % (ref 11.5–15.5)
WBC: 5.8 10*3/uL (ref 4.0–10.5)

## 2011-08-25 LAB — DIFFERENTIAL
Basophils Absolute: 0 10*3/uL (ref 0.0–0.1)
Basophils Relative: 0 % (ref 0–1)
Eosinophils Relative: 4 % (ref 0–5)
Eosinophils Relative: 5 % (ref 0–5)
Lymphocytes Relative: 17 % (ref 12–46)
Lymphs Abs: 1 10*3/uL (ref 0.7–4.0)
Monocytes Absolute: 0.5 10*3/uL (ref 0.1–1.0)
Monocytes Absolute: 0.6 10*3/uL (ref 0.1–1.0)
Monocytes Relative: 10 % (ref 3–12)
Monocytes Relative: 8 % (ref 3–12)
Neutro Abs: 3.9 10*3/uL (ref 1.7–7.7)

## 2011-08-25 LAB — COMPREHENSIVE METABOLIC PANEL
AST: 29 U/L (ref 0–37)
AST: 32 U/L (ref 0–37)
Albumin: 2.6 g/dL — ABNORMAL LOW (ref 3.5–5.2)
BUN: 3 mg/dL — ABNORMAL LOW (ref 6–23)
BUN: 3 mg/dL — ABNORMAL LOW (ref 6–23)
CO2: 23 mEq/L (ref 19–32)
Calcium: 9 mg/dL (ref 8.4–10.5)
Calcium: 9.7 mg/dL (ref 8.4–10.5)
Chloride: 105 mEq/L (ref 96–112)
Chloride: 106 mEq/L (ref 96–112)
Creatinine, Ser: 0.52 mg/dL (ref 0.50–1.10)
Creatinine, Ser: 0.53 mg/dL (ref 0.50–1.10)
GFR calc Af Amer: >90 mL/min (ref >90)
GFR calc non Af Amer: >90 mL/min (ref >90)
GFR calc non Af Amer: >90 mL/min (ref >90)
Glucose, Bld: 103 mg/dL — ABNORMAL HIGH (ref 70–99)
Total Bilirubin: 0.6 mg/dL (ref 0.3–1.2)
Total Bilirubin: 0.6 mg/dL (ref 0.3–1.2)

## 2011-08-25 LAB — GLUCOSE, CAPILLARY
Glucose-Capillary: 112 mg/dL — ABNORMAL HIGH (ref 70–99)
Glucose-Capillary: 76 mg/dL (ref 70–99)

## 2011-08-25 LAB — LIPASE, BLOOD: Lipase: 49 U/L (ref 11–59)

## 2011-08-25 NOTE — Progress Notes (Signed)
PT Discharge Note  Patient is being discharged from PT services secondary to:    Goals met and no further therapy needs identified.  Please see latest Therapy Progress Note for current level of functioning and progress toward goals.  Progress and discharge plan and discussed with patient/caregiver and they agree.  Acute PT signing off.  Syrianna Schillaci L. Lasonia Casino DPT [phone] 08/25/2011

## 2011-08-25 NOTE — Progress Notes (Signed)
PATIENT DETAILS Name: [name] Age: 60 y.o. Sex: female Date of Birth: 12-Jan-1951 Admit Date: 08/21/2011 WUJ:[id number],NWGNF P, MD  Subjective: Patient did fine last night. She developed nausea and vomiting after breakfast and she did vomit brownish mucousy vomitus, she does have 8/10 RUQ pain.  Objective: Vital signs in last 24 hours: Filed Vitals:   08/24/11 1448 08/24/11 2157 08/25/11 0520 08/25/11 1156  BP: 124/77 141/75 135/78 151/101  Pulse: 83 94 94 84  Temp: 97.8 F (36.6 C) 98.2 F (36.8 C) 98.2 F (36.8 C) 98.4 F (36.9 C)  TempSrc:  Oral Oral Oral  Resp: 18 18 18 16   Height:      Weight:      SpO2: 100% 99% 98% 98%    Weight change:   Body mass index is 28.32 kg/(m^2).  Intake/Output from previous day:  Intake/Output Summary (Last 24 hours) at 08/25/11 1250 Last data filed at 08/25/11 1000  Gross per 24 hour  Intake   1930 ml  Output    300 ml  Net   1630 ml    PHYSICAL EXAM: Gen Exam: Awake and alert with clear speech.   Neck: Supple, No JVD.   Chest: B/L Clear.   CVS: S1 S2 Regular, no murmurs.  Abdomen: soft, BS +, mildly diffusely tender, non distended.  Extremities: no edema, lower extremities warm to touch. Neurologic: Non Focal.  Skin: No Rash.   Wounds: N/A.    CONSULTS:  GI  LAB RESULTS: CBC  Lab 08/24/11 2335 08/23/11 0842 08/23/11 0600 08/22/11 0550 08/21/11 2111  WBC 6.4 6.1 5.7 9.0 10.5  HGB 10.3* 10.3* 9.9* 10.7* 11.5*  HCT 30.8* 32.1* 30.7* 32.8* 35.4*  PLT 166 144* 130* 154 156  MCV 80.4 83.2 83.4 82.8 82.1  MCH 26.9 26.7 26.9 27.0 26.7  MCHC 33.4 32.1 32.2 32.6 32.5  RDW 13.6 14.1 14.1 14.3 14.2  LYMPHSABS 1.6 -- -- -- --  MONOABS 0.5 -- -- -- --  EOSABS 0.3 -- -- -- --  BASOSABS 0.0 -- -- -- --  BANDABS -- -- -- -- --    Chemistries   Lab 08/24/11 2335 08/24/11 0505 08/23/11 1508 08/23/11 0600 08/22/11 0550 08/21/11 2111  NA 136 138 141 143 141 --  K 4.7 2.5* 2.9* 2.7* 3.2* --  CL 106 103 105 106 106 --    CO2 21 26 26 27 26  --  GLUCOSE 104* 106* 85 97 140* --  BUN 3* 5* 8 9 9  --  CREATININE 0.52 0.55 0.55 0.62 0.71 --  CALCIUM 9.0 8.6 8.8 9.0 9.4 --  MG -- -- -- -- -- 1.6   RADIOLOGY STUDIES/RESULTS: No results found.  MEDICATIONS: Scheduled Meds:    . calcium carbonate  1 tablet Oral Daily  . enoxaparin  40 mg Subcutaneous Q24H  . estrogens (conjugated)  0.9 mg Oral Daily  . insulin aspart  0-9 Units Subcutaneous TID WC  . metoprolol  50 mg Oral Daily  . pantoprazole  40 mg Oral Daily  . piperacillin-tazobactam (ZOSYN)  IV  3.375 g Intravenous Q8H  . potassium chloride  60 mEq Oral TID  . simvastatin  40 mg Oral q1800  . venlafaxine  150 mg Oral Daily  . vitamin B-12  1,000 mcg Oral Daily  . vitamin C  1,000 mg Oral Daily  . zolpidem  10 mg Oral QHS  . DISCONTD: potassium chloride  60 mEq Oral TID   Continuous Infusions:    . sodium chloride  0.9 % 1,000 mL with potassium chloride 40 mEq infusion 75 mL/hr at 08/25/11 0058   PRN Meds:.morphine, ondansetron (ZOFRAN) IV, ondansetron, oxyCODONE, polyethylene glycol, promethazine  Antibiotics: Anti-infectives     Start     Dose/Rate Route Frequency Ordered Stop   08/22/11 0000   piperacillin-tazobactam (ZOSYN) IVPB 2.25 g  Status:  Discontinued        2.25 g 100 mL/hr over 30 Minutes Intravenous 4 times per day 08/21/11 2154 08/21/11 2202   08/21/11 2300  piperacillin-tazobactam (ZOSYN) IVPB 3.375 g       3.375 g 12.5 mL/hr over 240 Minutes Intravenous 3 times per day 08/21/11 2203            Assessment/Plan: Patient Active Hospital Problem List:  Biliary Leak This is after laparoscopic cholecystectomy. Apparently this was seen on a hepatobiliary study done in [facility].  S/P ERCP and stenting done by Dr Madilyn Fireman. Patient is on Zosyn, no fever or leukocytosis. Patient has in the intermittent nausea and vomiting since admission. Again has vomiting this morning and 10 out of 10 RUQ pain. I will check lipase,  LFTs. Dr. Madilyn Fireman please advise.   Hypertension    Assessment: Controlled    Plan: Continue with metoprolol.   Diabetes mellitus    Assessment: CBGs stable.    Plan: Stop metformin while inpatient, place on SSI. Can resume metformin on discharge.   Tachycardia    Assessment: Resolved. Likely secondary to dehydration, and pain .    Plan: Monitor periodically.   Disposition: Remain inpatient,  the plan was to be discharged today, developed vomiting again. Await GI advise.   DVT Prophylaxis: Subcutaneous Lovenox  Code Status: Full code  Agamjot Kilgallon A 08/25/2011, 12:50 PM

## 2011-08-25 NOTE — Progress Notes (Signed)
[facility] Progress Note  Subjective: Still having intermittent bouts of abdominal pain nausea and vomiting with periods in between when she feels relatively better. Overall her pain is improved but not by much  Objective: Vital signs in last 24 hours: Temp:  [97.3 F (36.3 C)-98.4 F (36.9 C)] 97.3 F (36.3 C) (12/28 1344) Pulse Rate:  [77-94] 77  (12/28 1344) Resp:  [16-18] 18  (12/28 1344) BP: (124-151)/(75-101) 129/81 mmHg (12/28 1344) SpO2:  [96 %-100 %] 96 % (12/28 1344) Weight change:    PE: Abdomen soft positive bowel sounds, mild epigastric tenderness without guarding or rebound  Lab Results: Results for orders placed during the hospital encounter of 08/21/11 (from the past 24 hour(s))  GLUCOSE, CAPILLARY     Status: Normal   Collection Time   08/24/11  5:37 PM      Component Value Range   Glucose-Capillary 91  70 - 99 (mg/dL)   Comment 1 Notify RN    GLUCOSE, CAPILLARY     Status: Normal   Collection Time   08/24/11  9:59 PM      Component Value Range   Glucose-Capillary 93  70 - 99 (mg/dL)  CBC     Status: Abnormal   Collection Time   08/24/11 11:35 PM      Component Value Range   WBC 6.4  4.0 - 10.5 (K/uL)   RBC 3.83 (*) 3.87 - 5.11 (MIL/uL)   Hemoglobin 10.3 (*) 12.0 - 15.0 (g/dL)   HCT 04.5 (*) 40.9 - 46.0 (%)   MCV 80.4  78.0 - 100.0 (fL)   MCH 26.9  26.0 - 34.0 (pg)   MCHC 33.4  30.0 - 36.0 (g/dL)   RDW 81.1  91.4 - 78.2 (%)   Platelets 166  150 - 400 (K/uL)  DIFFERENTIAL     Status: Normal   Collection Time   08/24/11 11:35 PM      Component Value Range   Neutrophils Relative 62  43 - 77 (%)   Neutro Abs 4.0  1.7 - 7.7 (K/uL)   Lymphocytes Relative 25  12 - 46 (%)   Lymphs Abs 1.6  0.7 - 4.0 (K/uL)   Monocytes Relative 8  3 - 12 (%)   Monocytes Absolute 0.5  0.1 - 1.0 (K/uL)   Eosinophils Relative 4  0 - 5 (%)   Eosinophils Absolute 0.3  0.0 - 0.7 (K/uL)   Basophils Relative 0  0 - 1 (%)   Basophils Absolute 0.0  0.0 - 0.1 (K/uL)   COMPREHENSIVE METABOLIC PANEL     Status: Abnormal   Collection Time   08/24/11 11:35 PM      Component Value Range   Sodium 136  135 - 145 (mEq/L)   Potassium 4.7  3.5 - 5.1 (mEq/L)   Chloride 106  96 - 112 (mEq/L)   CO2 21  19 - 32 (mEq/L)   Glucose, Bld 104 (*) 70 - 99 (mg/dL)   BUN 3 (*) 6 - 23 (mg/dL)   Creatinine, Ser 9.56  0.50 - 1.10 (mg/dL)   Calcium 9.0  8.4 - 21.3 (mg/dL)   Total Protein 6.2  6.0 - 8.3 (g/dL)   Albumin 2.6 (*) 3.5 - 5.2 (g/dL)   AST 29  0 - 37 (U/L)   ALT 13  0 - 35 (U/L)   Alkaline Phosphatase 158 (*) 39 - 117 (U/L)   Total Bilirubin 0.6  0.3 - 1.2 (mg/dL)   GFR calc non Af Amer >  90  >90 (mL/min)   GFR calc Af Amer >90  >90 (mL/min)  GLUCOSE, CAPILLARY     Status: Abnormal   Collection Time   08/25/11  8:10 AM      Component Value Range   Glucose-Capillary 112 (*) 70 - 99 (mg/dL)   Comment 1 Notify RN    GLUCOSE, CAPILLARY     Status: Abnormal   Collection Time   08/25/11 12:36 PM      Component Value Range   Glucose-Capillary 101 (*) 70 - 99 (mg/dL)   Comment 1 Notify RN    CBC     Status: Abnormal   Collection Time   08/25/11  1:03 PM      Component Value Range   WBC 5.8  4.0 - 10.5 (K/uL)   RBC 4.16  3.87 - 5.11 (MIL/uL)   Hemoglobin 11.3 (*) 12.0 - 15.0 (g/dL)   HCT 16.1 (*) 09.6 - 46.0 (%)   MCV 81.7  78.0 - 100.0 (fL)   MCH 27.2  26.0 - 34.0 (pg)   MCHC 33.2  30.0 - 36.0 (g/dL)   RDW 04.5  40.9 - 81.1 (%)   Platelets 187  150 - 400 (K/uL)  DIFFERENTIAL     Status: Normal   Collection Time   08/25/11  1:03 PM      Component Value Range   Neutrophils Relative 67  43 - 77 (%)   Neutro Abs 3.9  1.7 - 7.7 (K/uL)   Lymphocytes Relative 17  12 - 46 (%)   Lymphs Abs 1.0  0.7 - 4.0 (K/uL)   Monocytes Relative 10  3 - 12 (%)   Monocytes Absolute 0.6  0.1 - 1.0 (K/uL)   Eosinophils Relative 5  0 - 5 (%)   Eosinophils Absolute 0.3  0.0 - 0.7 (K/uL)   Basophils Relative 0  0 - 1 (%)   Basophils Absolute 0.0  0.0 - 0.1 (K/uL)     Studies/Results: No results found.    Assessment: 1. Bile leak status post biliary stenting 2. Free air under the diaphragm by yesterday he probably from previous laparoscopic cholecystectomy 3. Persistent abdominal pain nausea and vomiting, presumed from biloma, rule out procedure-related pancreatitis, abscess,  Plan:  1. Liver function tests and lipase ordered and pending 2. Recheck CBC tomorrow for any leukocytosis 3. We'll continue to allow bland diet for now 4. If continued nausea vomiting and abdominal pain and/or elevated liver function tests, white blood cell count or lipase, will obtain abdominal CT scan.    Lashawnta Burgert C 08/25/2011, 2:00 PM

## 2011-08-25 NOTE — Progress Notes (Signed)
Physical Therapy Treatment Patient Details Name: [name] MRN: [id number] DOB: [birth year] Today's Date: 08/25/2011  PT Assessment/Plan  PT - Assessment/Plan Comments on Treatment Session: Pt much improved from eval.  No longer presenting with PT needs.  PT Plan: All goals met and education completed, patient dischaged from PT services Follow Up Recommendations: None Equipment Recommended: Other (comment) PT Goals  Acute Rehab PT Goals PT Goal Formulation: With patient Pt will Ambulate: >150 feet;Independently;with least restrictive assistive device PT Goal: Ambulate - Progress: Met Pt will Go Up / Down Stairs: 3-5 stairs;Independently;with least restrictive assistive device PT Goal: Up/Down Stairs - Progress: Met Additional Goals Additional Goal #1: Perform standard balance test within safe limits.   PT Goal: Additional Goal #1 - Progress: Met  PT Treatment Precautions/Restrictions  Precautions Precautions: Fall Restrictions Weight Bearing Restrictions: No Mobility (including Balance) Bed Mobility Bed Mobility: Yes Supine to Sit: 7: Independent Sit to Supine - Left: 7: Independent Transfers Transfers: Yes Sit to Stand: 7: Independent Stand to Sit: 7: Independent Stand Pivot Transfers: 7: Independent Ambulation/Gait Ambulation/Gait: Yes Ambulation/Gait Assistance: 7: Independent Ambulation/Gait Assistance Details (indicate cue type and reason): no LOB or assistance required.  Ambulation Distance (Feet): 300 Feet Assistive device: None Gait Pattern: Within Functional Limits Gait velocity: WFL Stairs: Yes Stairs Assistance: 7: Independent Stair Management Technique: One rail Right;Forwards;Alternating pattern Number of Stairs: 5  Height of Stairs: 4  Wheelchair Mobility Wheelchair Mobility: No  Posture/Postural Control Posture/Postural Control: No significant limitations Balance Balance Assessed: Yes Static Sitting Balance Static Sitting - Balance Support:  No upper extremity supported Static Sitting - Level of Assistance: 7: Independent Static Sitting - Comment/# of Minutes: 2 mins while performing functional task with no LOB Dynamic Gait Index Level Surface: Normal Change in Gait Speed: Normal Gait with Horizontal Head Turns: Mild Impairment Gait with Vertical Head Turns: Normal Gait and Pivot Turn: Normal Step Over Obstacle: Mild Impairment Step Around Obstacles: Normal Steps: Normal Total Score: 22  Exercise    End of Session PT - End of Session Equipment Utilized During Treatment: Gait belt Activity Tolerance: Patient tolerated treatment well Patient left: in bed;with call bell in reach Nurse Communication: Mobility status for transfers;Mobility status for ambulation General Behavior During Session: [facility] for tasks performed Cognition: [facility] for tasks performed  Halden Phegley 08/25/2011, 5:33 PM Durrel Mcnee L. Reyce Lubeck DPT [phone]

## 2011-08-25 NOTE — Progress Notes (Signed)
Pt ate few bites only of breakfast. C/o sharp epigastric pain and nausea. Vomited small amt of brown mucousy emesis.Dr. Madilyn Fireman informed. Irena Reichmann 08/25/2011

## 2011-08-26 DIAGNOSIS — K9189 Other postprocedural complications and disorders of digestive system: Secondary | ICD-10-CM | POA: Diagnosis present

## 2011-08-26 LAB — COMPREHENSIVE METABOLIC PANEL
ALT: 17 U/L (ref 0–35)
Albumin: 2.5 g/dL — ABNORMAL LOW (ref 3.5–5.2)
Calcium: 9.7 mg/dL (ref 8.4–10.5)
GFR calc Af Amer: >90 mL/min (ref >90)
Glucose, Bld: 97 mg/dL (ref 70–99)
Sodium: 137 mEq/L (ref 135–145)
Total Protein: 6.4 g/dL (ref 6.0–8.3)

## 2011-08-26 LAB — GLUCOSE, CAPILLARY
Glucose-Capillary: 90 mg/dL (ref 70–99)
Glucose-Capillary: 98 mg/dL (ref 70–99)

## 2011-08-26 MED ORDER — SUCRALFATE 1 GM/10ML PO SUSP: 1.0000 g | Freq: Four times a day (QID) | ORAL | Status: DC

## 2011-08-26 MED ORDER — ONDANSETRON 8 MG PO TBDP: 8.0000 mg | ORAL_TABLET | Freq: Three times a day (TID) | ORAL | Status: AC | PRN

## 2011-08-26 MED ORDER — ATORVASTATIN CALCIUM 20 MG PO TABS: 20.0000 mg | ORAL_TABLET | Freq: Every day | ORAL | Status: DC

## 2011-08-26 NOTE — Discharge Summary (Addendum)
[name] DISCHARGE SUMMARY  [name]  MRN: [id number]  DOB:1951-08-19  Date of Admission: 08/21/2011 Date of Discharge: 08/26/2011         LOS: 5 days   Attending Physician:[name]  Patient's HYQ:MVHQIO,NGEXB P, MD  Consults: Treatment Team:  Angela Folk, MD   Discharge Diagnoses: Present on Admission:  .Diabetes mellitus .Hypokalemia .Tachycardia .Biliary sludge .Hyperlipidemia .GERD (gastroesophageal reflux disease) .Hypertension .Anastomotic leak of biliary tree   Current Discharge Medication List    START taking these medications   Details  ondansetron (ZOFRAN ODT) 8 MG disintegrating tablet Take 1 tablet (8 mg total) by mouth every 8 (eight) hours as needed for nausea. Qty: 20 tablet, Refills: 0    sucralfate (CARAFATE) 1 GM/10ML suspension Take 10 mLs (1 g total) by mouth 4 (four) times daily. Qty: 420 mL, Refills: 0      CONTINUE these medications which have CHANGED   Details  atorvastatin (LIPITOR) 20 MG tablet Take 1 tablet (20 mg total) by mouth at bedtime.      CONTINUE these medications which have NOT CHANGED   Details  calcium carbonate (OS-CAL) 600 MG TABS Take 600 mg by mouth daily.      esomeprazole (NEXIUM) 40 MG capsule Take 40 mg by mouth daily before breakfast.      estrogens conjugated, synthetic [name], (CENESTIN) 0.9 MG tablet Take 0.9 mg by mouth daily.      metFORMIN (GLUCOPHAGE-XR) 500 MG 24 hr tablet Take 500 mg by mouth daily with breakfast.     metoprolol (LOPRESSOR) 50 MG tablet Take 50 mg by mouth daily.     venlafaxine (EFFEXOR) 100 MG tablet Take 150 mg by mouth daily.     vitamin B-12 (CYANOCOBALAMIN) 1000 MCG tablet Take 1,000 mcg by mouth daily.      vitamin [name] (ASCORBIC ACID) 500 MG tablet Take 1,000 mg by mouth daily.           Brief Admission History: Pt is 60 yo female who underwent laparoscopic cholecystectomy 08/17/2011 by Dr. Cristy [name] and now initially presented to [name] for progressively  worsening RUQ abdominal pain that she describes as intermittent, colicky in nature, 7/10 in severity radiating to her back and entire abdomen and with no specific aggravating or alleviating factors.  Please note on HIDA scan done at [name]: there was an indication of suspected biliary leak, Dr. Madilyn [name] with GI was called and has recommended transfer to [name] for further evaluation and care and potential need for ERCP.  [name] Course: Present on Admission:  .Diabetes mellitus .Hypokalemia .Tachycardia .Biliary sludge .Hyperlipidemia .GERD (gastroesophageal reflux disease) .Hypertension .Anastomotic leak of biliary tree  1. Biliary leak: Patient undergone laparoscopic cholecystectomy [name] for progressive RUQ pain. After the surgery patient had intermittent colicky pain [name] chair. HIDA scan was done which showed probable biliary leak. Patient transferred to [name] for further evaluation. Patient evaluated by Dr. Dorena [name] from [facility], ERCP with sphincterotomy and stent placement on 08/22/2011. Patient tolerated the procedure very well after the procedure she had intermittent nausea and vomiting likely secondary to bilious reflux.  There is no symptoms or signs of intra-abdominal infection. Patient did not have leukocytosis, fever and she does not look toxic. Even though patient was on Zosyn since admission. At time of discharge I did not think she needs antibiotic. Patient stay in the [name] for 3 days, and when she tolerated food she was discharged home. At the day of discharge Dr. Ewing [name] recommended  sucralfate and continue the PPI. Patient to followup with Dr. Madilyn [name] in 3 weeks.  2. GERD: This is was probably contributing to her nausea and vomiting her PPI was continued.  3. Hypokalemia: This is likely from the GI loss of potassium. Her potassium went down to 2.5. Aggressive replacement with oral and IV supplementation was done and patient at the time of  discharge has potassium level of 4.3.  4. Tachycardia: Patient did have tachycardia probably secondary to pain and stress, her beta blocker was restarted and her rhythm with back to normal rate.  5. Hypertension and dyslipidemia: Her home medication for these chronic problems was restarted no changes were done.  6. Diabetes mellitus type 2: Uncontrolled with hemoglobin A1c of 6.5. Patient came in started on carbohydrate modified diet and insulin sliding scale. At the time of discharge her metformin was restarted.   Day of Discharge BP 135/84  Pulse 82  Temp(Src) 98.1 F (36.7 [name]) (Oral)  Resp 20  Ht 5\' 4"  (1.626 m)  Wt 74.844 kg (165 lb)  BMI 28.32 kg/m2  SpO2 99% Physical Exam: GEN: No acute distress, cooperative with exam PSYCH: He is alert and oriented x4; does not appear anxious does not appear depressed; affect is normal  HEENT: Mucous membranes pink and anicteric;  Mouth: without oral thrush or lesions Eyes: PERRLA; EOM intact;  Neck: no cervical lymphadenopathy nor thyromegaly or carotid bruit; no JVD;  CHEST WALL: No tenderness, symmetrical to breathing bilaterally CHEST: Normal respiration, clear to auscultation bilaterally  HEART: Regular rate and rhythm; no murmurs, rubs or gallops, S1 and S2 heard  BACK: No kyphosis or scoliosis; no CVA tenderness  ABDOMEN:  soft non-tender; no masses, no organomegaly, normal abdominal bowel sounds; no pannus; no intertriginous candida.  EXTREMITIES: No bone or joint deformity; no edema; no ulcerations.  PULSES: 2+ and symmetric, neurovascularity is intact SKIN: Normal hydration no rash or ulceration, no flushing or suspicious lesions  CNS: Cranial nerves 2-12 grossly intact no focal neurologic deficit, coordination is intact gait not tested    Results for orders placed during the [name] encounter of 08/21/11 (from the past 24 hour(s))  LIPASE, BLOOD     Status: Normal   Collection Time   08/25/11  3:52 PM      Component Value  Range   Lipase 49  11 - 59 (U/L)  GLUCOSE, CAPILLARY     Status: Normal   Collection Time   08/25/11  5:31 PM      Component Value Range   Glucose-Capillary 88  70 - 99 (mg/dL)   Comment 1 Notify RN    GLUCOSE, CAPILLARY     Status: Normal   Collection Time   08/25/11 10:16 PM      Component Value Range   Glucose-Capillary 76  70 - 99 (mg/dL)  COMPREHENSIVE METABOLIC PANEL     Status: Abnormal   Collection Time   08/26/11  6:00 AM      Component Value Range   Sodium 137  135 - 145 (mEq/L)   Potassium 4.3  3.5 - 5.1 (mEq/L)   Chloride 104  96 - 112 (mEq/L)   CO2 22  19 - 32 (mEq/L)   Glucose, Bld 97  70 - 99 (mg/dL)   BUN 4 (*) 6 - 23 (mg/dL)   Creatinine, Ser 2.95  0.50 - 1.10 (mg/dL)   Calcium 9.7  8.4 - 62.1 (mg/dL)   Total Protein 6.4  6.0 - 8.3 (g/dL)   Albumin 2.5 (*)  3.5 - 5.2 (g/dL)   AST 36  0 - 37 (U/L)   ALT 17  0 - 35 (U/L)   Alkaline Phosphatase 195 (*) 39 - 117 (U/L)   Total Bilirubin 0.5  0.3 - 1.2 (mg/dL)   GFR calc non Af Amer >90  >90 (mL/min)   GFR calc Af Amer >90  >90 (mL/min)  GLUCOSE, CAPILLARY     Status: Normal   Collection Time   08/26/11  7:29 AM      Component Value Range   Glucose-Capillary 90  70 - 99 (mg/dL)   Comment 1 Documented in Chart     Comment 2 Notify RN    GLUCOSE, CAPILLARY     Status: Normal   Collection Time   08/26/11 11:59 AM      Component Value Range   Glucose-Capillary 98  70 - 99 (mg/dL)   Comment 1 Documented in Chart     Comment 2 Notify RN      Disposition: Home   Follow-up Appts: Discharge Orders    Future Orders Please Complete By Expires   Diet Carb Modified      Increase activity slowly         Follow-up Information    Follow up with HAYES,Angela C, MD. Make an appointment in 3 weeks.   Contact information:   [address] [employer], [place]. [place] [name] [place] [address] [phone]       Follow up with HOWARD,[name]. Make an appointment in 2 weeks.    Contact information:   [address] [place] [address] [phone]          I spent 40 minutes completing paperwork and coordinating discharge efforts.  SignedClydia Llano [name] 08/26/2011, 1:39 PM

## 2011-08-26 NOTE — Progress Notes (Signed)
Carrington Clamp 12:25 PM  Subjective: The patient is doing much better although she does have some complaints of bile reflux and some decreased appetite but does not have any pain and is passing gas from below and has no new complaints Objective: Vital signs stable afebrile abdomen is soft nontender labs are okay  Assessment: Improved  Plan: The patient is probably well enough to go home and I suggested possible Carafate trial for her presumed biliary reflux and we discussed the medicine with her and her husband and she will need a followup in a few weeks with my partner Dr. Madilyn Fireman and probable stent removal in a few months and please call if any further question or problem  [facility]

## 2011-09-07 ENCOUNTER — Other Ambulatory Visit (HOSPITAL_COMMUNITY): Payer: Self-pay | Admitting: Gastroenterology

## 2011-09-07 DIAGNOSIS — K929 Disease of digestive system, unspecified: Secondary | ICD-10-CM

## 2011-09-11 ENCOUNTER — Encounter (HOSPITAL_COMMUNITY): Payer: Self-pay | Admitting: Emergency Medicine

## 2011-09-11 ENCOUNTER — Inpatient Hospital Stay (HOSPITAL_COMMUNITY)
Admission: EM | Admit: 2011-09-11 | Discharge: 2011-09-16 | DRG: 182 | Disposition: A | Discharge status: Home / Self Care | Payer: BC Managed Care – PPO | Attending: Internal Medicine | Admitting: Internal Medicine

## 2011-09-11 DIAGNOSIS — Z794 Long term (current) use of insulin: Secondary | ICD-10-CM

## 2011-09-11 DIAGNOSIS — M19049 Primary osteoarthritis, unspecified hand: Secondary | ICD-10-CM | POA: Diagnosis present

## 2011-09-11 DIAGNOSIS — E119 Type 2 diabetes mellitus without complications: Secondary | ICD-10-CM | POA: Diagnosis present

## 2011-09-11 DIAGNOSIS — R112 Nausea with vomiting, unspecified: Secondary | ICD-10-CM | POA: Diagnosis present

## 2011-09-11 DIAGNOSIS — N200 Calculus of kidney: Secondary | ICD-10-CM | POA: Diagnosis present

## 2011-09-11 DIAGNOSIS — N390 Urinary tract infection, site not specified: Secondary | ICD-10-CM | POA: Diagnosis present

## 2011-09-11 DIAGNOSIS — K449 Diaphragmatic hernia without obstruction or gangrene: Secondary | ICD-10-CM | POA: Diagnosis present

## 2011-09-11 DIAGNOSIS — E785 Hyperlipidemia, unspecified: Secondary | ICD-10-CM | POA: Diagnosis present

## 2011-09-11 DIAGNOSIS — E876 Hypokalemia: Secondary | ICD-10-CM | POA: Diagnosis present

## 2011-09-11 DIAGNOSIS — R1110 Vomiting, unspecified: Secondary | ICD-10-CM

## 2011-09-11 DIAGNOSIS — F329 Major depressive disorder, single episode, unspecified: Secondary | ICD-10-CM | POA: Diagnosis present

## 2011-09-11 DIAGNOSIS — R42 Dizziness and giddiness: Secondary | ICD-10-CM | POA: Diagnosis present

## 2011-09-11 DIAGNOSIS — I10 Essential (primary) hypertension: Secondary | ICD-10-CM | POA: Diagnosis present

## 2011-09-11 DIAGNOSIS — E860 Dehydration: Secondary | ICD-10-CM | POA: Diagnosis present

## 2011-09-11 DIAGNOSIS — R1115 Cyclical vomiting syndrome unrelated to migraine: Secondary | ICD-10-CM | POA: Diagnosis present

## 2011-09-11 DIAGNOSIS — K3184 Gastroparesis: Principal | ICD-10-CM | POA: Diagnosis present

## 2011-09-11 DIAGNOSIS — Z79899 Other long term (current) drug therapy: Secondary | ICD-10-CM

## 2011-09-11 DIAGNOSIS — F3289 Other specified depressive episodes: Secondary | ICD-10-CM | POA: Diagnosis present

## 2011-09-11 DIAGNOSIS — K219 Gastro-esophageal reflux disease without esophagitis: Secondary | ICD-10-CM | POA: Diagnosis present

## 2011-09-11 LAB — HEPATIC FUNCTION PANEL
ALT: 33 U/L (ref 0–35)
Bilirubin, Direct: 0.1 mg/dL (ref 0.0–0.3)
Indirect Bilirubin: 0.4 mg/dL (ref 0.3–0.9)
Total Bilirubin: 0.5 mg/dL (ref 0.3–1.2)

## 2011-09-11 LAB — DIFFERENTIAL
Basophils Absolute: 0 10*3/uL (ref 0.0–0.1)
Eosinophils Absolute: 0.2 10*3/uL (ref 0.0–0.7)
Eosinophils Relative: 2 % (ref 0–5)

## 2011-09-11 LAB — CBC
MCH: 28 pg (ref 26.0–34.0)
MCV: 81.5 fL (ref 78.0–100.0)
Platelets: 274 10*3/uL (ref 150–400)
RDW: 14.1 % (ref 11.5–15.5)

## 2011-09-11 LAB — LIPASE, BLOOD: Lipase: 28 U/L (ref 11–59)

## 2011-09-11 LAB — BASIC METABOLIC PANEL
Calcium: 11.2 mg/dL — ABNORMAL HIGH (ref 8.4–10.5)
GFR calc non Af Amer: 49 mL/min — ABNORMAL LOW (ref >90)
Sodium: 140 mEq/L (ref 135–145)

## 2011-09-11 MED ORDER — FUROSEMIDE 20 MG PO TABS
ORAL_TABLET | ORAL | Status: AC
  Filled 2011-09-11: qty 1

## 2011-09-11 MED ORDER — ONDANSETRON HCL 4 MG/2ML IJ SOLN
4.0000 mg | Freq: Once | INTRAMUSCULAR | Status: AC
  Administered 2011-09-11: 4 mg via INTRAVENOUS
  Filled 2011-09-11: qty 2

## 2011-09-11 MED ORDER — POTASSIUM CHLORIDE CRYS ER 20 MEQ PO TBCR
EXTENDED_RELEASE_TABLET | ORAL | Status: AC
  Filled 2011-09-11: qty 4

## 2011-09-11 MED ORDER — POTASSIUM CHLORIDE 10 MEQ/100ML IV SOLN
10.0000 meq | INTRAVENOUS | Status: AC
  Administered 2011-09-11 – 2011-09-12 (×4): 10 meq via INTRAVENOUS
  Filled 2011-09-11: qty 100
  Filled 2011-09-11: qty 300

## 2011-09-11 MED ORDER — SODIUM CHLORIDE 0.9 % IV SOLN
INTRAVENOUS | Status: DC
  Administered 2011-09-11 – 2011-09-12 (×2): via INTRAVENOUS

## 2011-09-11 NOTE — ED Notes (Signed)
PT. REPORTS UNABLE TO EAT FOR 6 DAYS WITH PERSISTENT VOMITTING , ADVISED BY PCP TO GO TO ER FOR FURTHER EVALUATION ,  NO DIARRHEA , NO FEVER OR CHILLS .

## 2011-09-11 NOTE — ED Provider Notes (Cosign Needed)
History     CSN: [id number]  Arrival date & time 09/11/11  1844   First MD Initiated Contact with Patient 09/11/11 2242      Chief Complaint  Patient presents with  . Emesis    (Consider location/radiation/quality/duration/timing/severity/associated sxs/prior treatment) HPI Complains of vomiting for the past 5-6 weeks since she had a cholecystectomy and subsequent biliary stent denies abdominal pain denies fever . Complains of generalized weakness no other complaint no other associated symptoms. Contact her primary physician today who advised her to come to the hospital. Last bowel movement 2 days ago, normal. Treated with Reglan and Zofran which she is unable to hold down without vomiting.  Past Medical History  Diagnosis Date  . Hydronephrosis   . Hyperlipidemia   . GERD (gastroesophageal reflux disease)   . Diabetes mellitus   . Hypertension   . Depression   . Headache   . Arthritis     fingers    Past Surgical History  Procedure Date  . Ureteroscopy  4/12, 2/12, 3/10    X 3 SURGERIES  FOR KIDNEY STONES  . Cystoscopy     X 3  . Laparoscopic assisted vaginal hysterectomy 07/13/2011    Procedure: LAPAROSCOPIC ASSISTED VAGINAL HYSTERECTOMY;  Surgeon: Melony Overly;  Location: [facility];  Service: Gynecology;  Laterality: N/A;  . Salpingoophorectomy 07/13/2011    Procedure: SALPINGO OOPHERECTOMY;  Surgeon: Melony Overly;  Location: [facility];  Service: Gynecology;  Laterality: Bilateral;  . Anterior and posterior repair 07/13/2011    Procedure: ANTERIOR (CYSTOCELE) AND POSTERIOR REPAIR (RECTOCELE);  Surgeon: Melony Overly;  Location: [facility];  Service: Gynecology;  Laterality: N/A;  . Bladder suspension 07/13/2011    Procedure: TRANSVAGINAL TAPE (TVT) PROCEDURE;  Surgeon: Melony Overly;  Location: [facility];  Service: Gynecology;  Laterality: N/A;  . Cystoscopy 07/13/2011    Procedure: CYSTOSCOPY;  Surgeon: Melony Overly;  Location: [facility];  Service: Gynecology;  Laterality: N/A;    . Cholecystectomy   . Ercp 08/22/2011    Procedure: ENDOSCOPIC RETROGRADE CHOLANGIOPANCREATOGRAPHY (ERCP);  Surgeon: Barrie Folk, MD;  Location: [facility] ENDOSCOPY;  Service: Endoscopy;  Laterality: N/A;  . Biliary stent placement 08/22/2011    Procedure: BILIARY STENT PLACEMENT;  Surgeon: Barrie Folk, MD;  Location: [facility] ENDOSCOPY;  Service: Endoscopy;  Laterality: N/A;  46f x 5cm stent for bile leak. placed in [facility]    No family history on file.  History  Substance Use Topics  . Smoking status: Never Smoker   . Smokeless tobacco: Not on file  . Alcohol Use: No    OB History    Grav Para Term Preterm Abortions TAB SAB Ect Mult Living                  Review of Systems  Constitutional: Positive for fatigue.  HENT: Negative.   Respiratory: Negative.   Cardiovascular: Negative.   Gastrointestinal: Positive for nausea and vomiting. Negative for abdominal pain.  Musculoskeletal: Negative.   Skin: Negative.   Neurological: Negative.   Hematological: Negative.   Psychiatric/Behavioral: Negative.     Allergies  Flomax and Sulfa antibiotics  Home Medications   Current Outpatient Rx  Name Route Sig Dispense Refill  . ATORVASTATIN CALCIUM 20 MG PO TABS Oral Take 1 tablet (20 mg total) by mouth at bedtime.    Marland Kitchen ESOMEPRAZOLE MAGNESIUM 40 MG PO CPDR Oral Take 40 mg by mouth daily before breakfast.      . ESTROGENS CONJ SYNTHETIC A  0.9 MG PO TABS Oral Take 0.9 mg by mouth daily.      Marland Kitchen METFORMIN HCL ER 500 MG PO TB24 Oral Take 500 mg by mouth daily with breakfast.     . METOPROLOL TARTRATE 50 MG PO TABS Oral Take 50 mg by mouth daily.     . SUCRALFATE 1 GM/10ML PO SUSP Oral Take 10 mLs (1 g total) by mouth 4 (four) times daily. 420 mL 0  . VENLAFAXINE HCL ER 150 MG PO CP24 Oral Take 150 mg by mouth every morning.    Marland Kitchen VITAMIN B-12 1000 MCG PO TABS Oral Take 1,000 mcg by mouth daily.      Marland Kitchen VITAMIN C 500 MG PO TABS Oral Take 1,000 mg by mouth daily.        BP 112/93  Pulse 124   Temp(Src) 97.9 F (36.6 C) (Oral)  Resp 20  SpO2 98%  Physical Exam  Nursing note and vitals reviewed. Constitutional: She appears well-developed and well-nourished. No distress.  HENT:  Head: Normocephalic and atraumatic.       Mucous membranes dry  Eyes: Conjunctivae are normal. Pupils are equal, round, and reactive to light.  Neck: Neck supple. No tracheal deviation present. No thyromegaly present.  Cardiovascular: Normal rate and regular rhythm.   No murmur heard. Pulmonary/Chest: Effort normal and breath sounds normal.  Abdominal: Soft. She exhibits no distension. There is no tenderness.  Musculoskeletal: Normal range of motion. She exhibits no edema and no tenderness.  Neurological: She is alert. Coordination normal.  Skin: Skin is warm and dry. No rash noted.  Psychiatric: She has a normal mood and affect.    ED Course  Procedures (including critical care time) Spoke with Dr.J. Madilyn Fireman who placed biliary stent on on Christmas day 2012, requests admission the hospitalist. He will consult on case.spoke with Dr. Toniann Fail. Plan admit telemetry , iv hydration , admitting md to check  Ct scan Labs Reviewed  CBC - Abnormal; Notable for the following:    WBC 12.8 (*)    RBC 5.57 (*)    Hemoglobin 15.6 (*)    All other components within normal limits  DIFFERENTIAL - Abnormal; Notable for the following:    Neutro Abs 7.9 (*)    All other components within normal limits  BASIC METABOLIC PANEL - Abnormal; Notable for the following:    Potassium 3.0 (*)    Chloride 93 (*)    Glucose, Bld 173 (*)    Creatinine, Ser 1.19 (*)    Calcium 11.2 (*)    GFR calc non Af Amer 49 (*)    GFR calc Af Amer 57 (*)    All other components within normal limits  URINALYSIS, ROUTINE W REFLEX MICROSCOPIC   No results found. Results for orders placed during the hospital encounter of 09/11/11  CBC      Component Value Range   WBC 12.8 (*) 4.0 - 10.5 (K/uL)   RBC 5.57 (*) 3.87 - 5.11 (MIL/uL)     Hemoglobin 15.6 (*) 12.0 - 15.0 (g/dL)   HCT 16.1  09.6 - 04.5 (%)   MCV 81.5  78.0 - 100.0 (fL)   MCH 28.0  26.0 - 34.0 (pg)   MCHC 34.4  30.0 - 36.0 (g/dL)   RDW 40.9  81.1 - 91.4 (%)   Platelets 274  150 - 400 (K/uL)  DIFFERENTIAL      Component Value Range   Neutrophils Relative 62  43 - 77 (%)   Neutro  Abs 7.9 (*) 1.7 - 7.7 (K/uL)   Lymphocytes Relative 28  12 - 46 (%)   Lymphs Abs 3.6  0.7 - 4.0 (K/uL)   Monocytes Relative 8  3 - 12 (%)   Monocytes Absolute 1.0  0.1 - 1.0 (K/uL)   Eosinophils Relative 2  0 - 5 (%)   Eosinophils Absolute 0.2  0.0 - 0.7 (K/uL)   Basophils Relative 0  0 - 1 (%)   Basophils Absolute 0.0  0.0 - 0.1 (K/uL)  BASIC METABOLIC PANEL      Component Value Range   Sodium 140  135 - 145 (mEq/L)   Potassium 3.0 (*) 3.5 - 5.1 (mEq/L)   Chloride 93 (*) 96 - 112 (mEq/L)   CO2 27  19 - 32 (mEq/L)   Glucose, Bld 173 (*) 70 - 99 (mg/dL)   BUN 14  6 - 23 (mg/dL)   Creatinine, Ser 1.61 (*) 0.50 - 1.10 (mg/dL)   Calcium 09.6 (*) 8.4 - 10.5 (mg/dL)   GFR calc non Af Amer 49 (*) >90 (mL/min)   GFR calc Af Amer 57 (*) >90 (mL/min)  LIPASE, BLOOD      Component Value Range   Lipase 28  11 - 59 (U/L)  HEPATIC FUNCTION PANEL      Component Value Range   Total Protein 7.9  6.0 - 8.3 (g/dL)   Albumin 3.9  3.5 - 5.2 (g/dL)   AST 57 (*) 0 - 37 (U/L)   ALT 33  0 - 35 (U/L)   Alkaline Phosphatase 186 (*) 39 - 117 (U/L)   Total Bilirubin 0.5  0.3 - 1.2 (mg/dL)   Bilirubin, Direct 0.1  0.0 - 0.3 (mg/dL)   Indirect Bilirubin 0.4  0.3 - 0.9 (mg/dL)   Dg Ercp With Sphincterotomy  08/22/2011  *RADIOLOGY REPORT*  Clinical Data: Status post cholecystectomy.  Postop bile leak and biloma.  ERCP  Comparison:  None.  Technique:  Multiple spot images obtained with the fluoroscopic device and submitted for interpretation post-procedure.  ERCP was performed by Dr. Madilyn Fireman.  Findings: Fluoroscopic spot images show no evidence of biliary ductal dilatation.  No obstructing calculi  seen within the common bile duct.  The final image shows placement of a stent within the distal common bile duct, with contrast emptying from the biliary tree.  IMPRESSION: Successful placement of internal common bile duct stent.  No evidence of biliary obstruction.  These images were submitted for radiologic interpretation only. Please see the procedural report for the amount of contrast and the fluoroscopy time utilized.  Original Report Authenticated By: Danae Orleans, M.D.   Dg Abd 2 Views  08/23/2011  *RADIOLOGY REPORT*  Clinical Data: Nausea/vomiting status post ERCP dated 08/22/2011  ABDOMEN - 2 VIEW  Comparison: Intraoperative cholangiogram dated 08/22/2011  Findings: Nonobstructive bowel gas pattern.  Free air under the diaphragm on the upright view.  Internal biliary stent.  Cholecystectomy clips.  Visualized osseous structures are within normal limits.  IMPRESSION: Free air under the diaphragm on the upright view, likely postsurgical given recent cholecystectomy, follow-up radiographs are suggested to document resolution.  Original Report Authenticated By: Charline Bills, M.D.     No diagnosis found.    MDM  Plan admit IV hydration, potassium repletion, admitting doctor to check LFTs, serum magnesium and CT scan, GI consult with Dr. Madilyn Fireman Diagnosis #1 intractable vomiting #2 dehydration #3Hypokalemia #4 hyperglycemia       Doug Sou, MD 09/11/11 2358

## 2011-09-12 ENCOUNTER — Encounter (HOSPITAL_COMMUNITY): Payer: Self-pay | Admitting: Internal Medicine

## 2011-09-12 ENCOUNTER — Emergency Department (HOSPITAL_COMMUNITY): Payer: BC Managed Care – PPO

## 2011-09-12 ENCOUNTER — Inpatient Hospital Stay (HOSPITAL_COMMUNITY): Payer: BC Managed Care – PPO

## 2011-09-12 DIAGNOSIS — N390 Urinary tract infection, site not specified: Secondary | ICD-10-CM | POA: Diagnosis present

## 2011-09-12 DIAGNOSIS — R112 Nausea with vomiting, unspecified: Secondary | ICD-10-CM | POA: Diagnosis present

## 2011-09-12 LAB — COMPREHENSIVE METABOLIC PANEL
AST: 41 U/L — ABNORMAL HIGH (ref 0–37)
BUN: 14 mg/dL (ref 6–23)
CO2: 24 mEq/L (ref 19–32)
Calcium: 9.4 mg/dL (ref 8.4–10.5)
Chloride: 98 mEq/L (ref 96–112)
Creatinine, Ser: 0.94 mg/dL (ref 0.50–1.10)
GFR calc Af Amer: 75 mL/min — ABNORMAL LOW (ref >90)
GFR calc non Af Amer: 65 mL/min — ABNORMAL LOW (ref >90)
Glucose, Bld: 123 mg/dL — ABNORMAL HIGH (ref 70–99)
Total Bilirubin: 0.4 mg/dL (ref 0.3–1.2)

## 2011-09-12 LAB — URINALYSIS, ROUTINE W REFLEX MICROSCOPIC
Protein, ur: 100 mg/dL — AB
Urobilinogen, UA: 1 mg/dL (ref 0.0–1.0)

## 2011-09-12 LAB — CBC
HCT: 37.1 % (ref 36.0–46.0)
MCHC: 33.7 g/dL (ref 30.0–36.0)
MCV: 81.2 fL (ref 78.0–100.0)
RBC: 4.57 MIL/uL (ref 3.87–5.11)
RDW: 14.4 % (ref 11.5–15.5)

## 2011-09-12 LAB — GLUCOSE, CAPILLARY
Glucose-Capillary: 119 mg/dL — ABNORMAL HIGH (ref 70–99)
Glucose-Capillary: 114 mg/dL — ABNORMAL HIGH (ref 70–99)
Glucose-Capillary: 99 mg/dL (ref 70–99)

## 2011-09-12 LAB — URINE CULTURE
Colony Count: 25000
Culture  Setup Time: 201301150450

## 2011-09-12 LAB — URINE MICROSCOPIC-ADD ON

## 2011-09-12 LAB — HEMOGLOBIN A1C: Mean Plasma Glucose: 140 mg/dL — ABNORMAL HIGH (ref <117)

## 2011-09-12 MED ORDER — ONDANSETRON HCL 4 MG PO TABS: 4.0000 mg | ORAL_TABLET | Freq: Four times a day (QID) | ORAL | Status: DC | PRN

## 2011-09-12 MED ORDER — ONDANSETRON HCL 4 MG/2ML IJ SOLN
4.0000 mg | Freq: Four times a day (QID) | INTRAMUSCULAR | Status: DC | PRN
Start: 2011-09-12 — End: 2011-09-12
  Administered 2011-09-12: 4 mg via INTRAVENOUS
  Filled 2011-09-12: qty 2

## 2011-09-12 MED ORDER — IOHEXOL 300 MG/ML  SOLN: 20.0000 mL | INTRAMUSCULAR | Status: DC

## 2011-09-12 MED ORDER — CEFTRIAXONE SODIUM 1 G IJ SOLR
1.0000 g | INTRAMUSCULAR | Status: DC
  Administered 2011-09-12 – 2011-09-16 (×5): 1 g via INTRAVENOUS
  Filled 2011-09-12 (×6): qty 10

## 2011-09-12 MED ORDER — PANTOPRAZOLE SODIUM 40 MG IV SOLR
40.0000 mg | Freq: Two times a day (BID) | INTRAVENOUS | Status: DC
  Administered 2011-09-12 – 2011-09-16 (×9): 40 mg via INTRAVENOUS
  Filled 2011-09-12 (×11): qty 40

## 2011-09-12 MED ORDER — SODIUM CHLORIDE 0.9 % IV SOLN: INTRAVENOUS | Status: DC

## 2011-09-12 MED ORDER — POTASSIUM CHLORIDE IN NACL 20-0.45 MEQ/L-% IV SOLN
INTRAVENOUS | Status: DC
  Administered 2011-09-12 – 2011-09-14 (×7): via INTRAVENOUS
  Administered 2011-09-16: 20 mL via INTRAVENOUS
  Filled 2011-09-12 (×8): qty 1000

## 2011-09-12 MED ORDER — PROMETHAZINE HCL 25 MG/ML IJ SOLN
12.5000 mg | Freq: Four times a day (QID) | INTRAMUSCULAR | Status: DC | PRN
  Administered 2011-09-12 – 2011-09-13 (×2): 12.5 mg via INTRAVENOUS
  Filled 2011-09-12 (×2): qty 1

## 2011-09-12 MED ORDER — ACETAMINOPHEN 650 MG RE SUPP: 650.0000 mg | Freq: Four times a day (QID) | RECTAL | Status: DC | PRN

## 2011-09-12 MED ORDER — ONDANSETRON HCL 4 MG/2ML IJ SOLN
4.0000 mg | Freq: Four times a day (QID) | INTRAMUSCULAR | Status: DC | PRN
  Administered 2011-09-12 – 2011-09-15 (×2): 4 mg via INTRAVENOUS
  Filled 2011-09-12 (×2): qty 2

## 2011-09-12 MED ORDER — IOHEXOL 300 MG/ML  SOLN
80.0000 mL | Freq: Once | INTRAMUSCULAR | Status: AC | PRN
  Administered 2011-09-12: 80 mL via INTRAVENOUS

## 2011-09-12 MED ORDER — INSULIN ASPART 100 UNIT/ML ~~LOC~~ SOLN
0.0000 [IU] | Freq: Three times a day (TID) | SUBCUTANEOUS | Status: DC
  Filled 2011-09-12: qty 3

## 2011-09-12 MED ORDER — ACETAMINOPHEN 325 MG PO TABS: 650.0000 mg | ORAL_TABLET | Freq: Four times a day (QID) | ORAL | Status: DC | PRN

## 2011-09-12 NOTE — Progress Notes (Signed)
Patient ID: [name], female   DOB: Oct 30, 1950, 61 y.o.   MRN: [id number]  61 yo female with h/o DM and Genella Rife, underwent Hysterectomy & "bladder lift" on 07/12/12.  Continued to feel poorly.  Had cholecystectomy 08/16/12.  Developed bile leak.  Stents were placed on 08/21/12.  Has continued to have worsening N/V.  Reports 30 pound weight loss since 11/15.  Subjective:  Reports immediately vomiting all solids.  Keeps liquids down for short periods and then vomits them as well.  Continually spitting up small amounts of thick saliva.  DM under good control per patient.  Continually on phenergan.  Reports reglan doesn't help much.  Objective: Weight change:  No intake or output data in the 24 hours ending 09/12/11 0816 Blood pressure 100/71, pulse 102, temperature 98.1 F (36.7 C), temperature source Oral, resp. rate 20, height 5\' 4"  (1.626 m), weight 57.2 kg (126 lb 1.7 oz), SpO2 100.00%. Filed Vitals:   09/12/11 0004 09/12/11 0015 09/12/11 0217 09/12/11 0335  BP: 104/69 107/72 107/71 100/71  Pulse: 104 94 96 102  Temp:   98.6 F (37 C) 98.1 F (36.7 C)  TempSrc:   Oral Oral  Resp: 18 12 19 20   Height:    5\' 4"  (1.626 m)  Weight:    57.2 kg (126 lb 1.7 oz)  SpO2: 98% 97% 99% 100%    Physical Exam: General: No acute distress,  A&O x 3 Lungs: Clear to auscultation bilaterally without wheezes or crackles Cardiovascular: Regular rate and rhythm without murmur gallop or rub normal S1 and S2 Abdomen: diffuse minimal tenderness thru out abd., nondistended, soft, bowel sounds positive, no rebound, no ascites, no appreciable mass.  Surgical incisions well healed. Extremities: No significant cyanosis, clubbing, or edema bilateral lower extremities  Basic Metabolic Panel:  Lab 09/11/11 4132 09/11/11 1940  NA -- 140  K -- 3.0*  CL -- 93*  CO2 -- 27  GLUCOSE -- 173*  BUN -- 14  CREATININE -- 1.19*  CALCIUM -- 11.2*  MG 1.5 --  PHOS -- --   Liver Function Tests:  Lab 09/11/11 2259    AST 57*  ALT 33  ALKPHOS 186*  BILITOT 0.5  PROT 7.9  ALBUMIN 3.9    Lab 09/11/11 2259  LIPASE 28  AMYLASE --   CBC:  Lab 09/11/11 1940  WBC 12.8*  NEUTROABS 7.9*  HGB 15.6*  HCT 45.4  MCV 81.5  PLT 274   CBG:  Lab 09/12/11 0804  GLUCAP 119*   Urinalysis: 09/12/11  Ref. Range 09/12/2011 00:49  Color, Urine Latest Range: YELLOW  AMBER (A)  APPearance Latest Range: CLEAR  CLOUDY (A)  Specific Gravity, Urine Latest Range: 1.005-1.030  1.026  pH Latest Range: 5.0-8.0  6.0  Glucose, UA Latest Range: NEGATIVE mg/dL NEGATIVE  Bilirubin Urine Latest Range: NEGATIVE  MODERATE (A)  Ketones, ur Latest Range: NEGATIVE mg/dL 15 (A)  Protein Latest Range: NEGATIVE mg/dL 440 (A)  Urobilinogen, UA Latest Range: 0.0-1.0 mg/dL 1.0  Nitrite Latest Range: NEGATIVE  NEGATIVE  Leukocytes, UA Latest Range: NEGATIVE  MODERATE (A)  WBC, UA Latest Range: <3 WBC/hpf 7-10    Studies/Results:  09/12/11 CT Abd Pelvis:   1. No acute abnormalities seen within the abdomen or pelvis.  2. Findings of recent cholecystectomy, with mild residual soft tissue inflammation at the gallbladder fossa; no evidence of bile  leak. Associated soft tissue inflammation at the umbilicus likely reflects recent trocar placement.  3. Stent noted within the common  hepatic and common bile duct, in expected position. No evidence for obstruction.    Scheduled Meds:   . cefTRIAXone (ROCEPHIN)  IV  1 g Intravenous Q24H  . insulin aspart  0-9 Units Subcutaneous TID WC  . ondansetron  4 mg Intravenous Once  . potassium chloride  10 mEq Intravenous Q1 Hr x 4  . potassium chloride SA      . DISCONTD: sodium chloride   Intravenous STAT  . DISCONTD: furosemide      . DISCONTD: iohexol  20 mL Oral Q1 Hr x 2   Continuous Infusions:   . 0.45 % NaCl with KCl 20 mEq / L 125 mL/hr at 09/12/11 0559  . DISCONTD: sodium chloride 250 mL/hr at 09/12/11 0109   PRN Meds:.acetaminophen, acetaminophen, iohexol, ondansetron  (ZOFRAN) IV, ondansetron, DISCONTD: ondansetron  Assessment/Plan: Principal Problem:  *Nausea and vomiting Active Problems:  Diabetes mellitus  Hypercalcemia  UTI (lower urinary tract infection)  1.  N/V s/p multiple surgeries.  [facility], Dr. Madilyn Fireman, is consulting.  We appreciate his recommendations.  Gastric emptying scan and head CT pending.  2.  UTI - on rocephin.  Cultures pending.  3.  DM - diet controlled.  4.  Hypokalemia - repleted.  5. Dehydration - IV rehydration.    LOS: 1 day   Stephani Police 09/12/2011, 8:16 AM [phone]

## 2011-09-12 NOTE — Progress Notes (Signed)
patient arrived the unit at about 0330 , alert and orientedx3. No c/o pain noted.Vital signs stable.All meds given as ordered.Will continue to monitor patient.

## 2011-09-12 NOTE — H&P (Signed)
[name] is an 61 y.o. female.   PCP - at [facility] - Dr.Hayes. Chief Complaint: Nausea and vomiting. HPI: 61 year old female with history of a cholecystectomy last month and subsequent to which patient developed a bile leak and had ERCP with stent placement by Dr. Madilyn Fireman last month. Has been experiencing nausea and vomiting persistently worsening. There were plans to do the gastric emptying study. Patient was referred to [facility] by her PCP. ER physician contacted Dr. Madilyn Fireman at this time Dr. Madilyn Fireman has requested hospitalist admission and he will be seen in patient in consult. He also requested a CT abdomen and pelvis. Patient denies any abdominal pain or diarrhea.  Past Medical History  Diagnosis Date  . Hydronephrosis   . Hyperlipidemia   . GERD (gastroesophageal reflux disease)   . Diabetes mellitus   . Hypertension   . Depression   . Headache   . Arthritis     fingers    Past Surgical History  Procedure Date  . Ureteroscopy  4/12, 2/12, 3/10    X 3 SURGERIES  FOR KIDNEY STONES  . Cystoscopy     X 3  . Laparoscopic assisted vaginal hysterectomy 07/13/2011    Procedure: LAPAROSCOPIC ASSISTED VAGINAL HYSTERECTOMY;  Surgeon: Melony Overly;  Location: [facility];  Service: Gynecology;  Laterality: N/A;  . Salpingoophorectomy 07/13/2011    Procedure: SALPINGO OOPHERECTOMY;  Surgeon: Melony Overly;  Location: [facility];  Service: Gynecology;  Laterality: Bilateral;  . Anterior and posterior repair 07/13/2011    Procedure: ANTERIOR (CYSTOCELE) AND POSTERIOR REPAIR (RECTOCELE);  Surgeon: Melony Overly;  Location: [facility];  Service: Gynecology;  Laterality: N/A;  . Bladder suspension 07/13/2011    Procedure: TRANSVAGINAL TAPE (TVT) PROCEDURE;  Surgeon: Melony Overly;  Location: [facility];  Service: Gynecology;  Laterality: N/A;  . Cystoscopy 07/13/2011    Procedure: CYSTOSCOPY;  Surgeon: Melony Overly;  Location: [facility];  Service: Gynecology;  Laterality: N/A;  . Cholecystectomy   . Ercp  08/22/2011    Procedure: ENDOSCOPIC RETROGRADE CHOLANGIOPANCREATOGRAPHY (ERCP);  Surgeon: Barrie Folk, MD;  Location: [facility] ENDOSCOPY;  Service: Endoscopy;  Laterality: N/A;  . Biliary stent placement 08/22/2011    Procedure: BILIARY STENT PLACEMENT;  Surgeon: Barrie Folk, MD;  Location: [facility] ENDOSCOPY;  Service: Endoscopy;  Laterality: N/A;  8f x 5cm stent for bile leak. placed in CBD    History reviewed. No pertinent family history. Social History:  reports that she has never smoked. She does not have any smokeless tobacco history on file. She reports that she does not drink alcohol or use illicit drugs.  Allergies:  Allergies  Allergen Reactions  . Flomax (Tamsulosin Hcl) Swelling    Pt. Had swelling of jaw  . Sulfa Antibiotics     Medications Prior to Admission  Medication Dose Route Frequency Provider Last Rate Last Dose  . 0.9 %  sodium chloride infusion   Intravenous Continuous Doug Sou, MD 250 mL/hr at 09/12/11 0109    . 0.9 %  sodium chloride infusion   Intravenous STAT Doug Sou, MD      . iohexol (OMNIPAQUE) 300 MG/ML solution 20 mL  20 mL Oral Q1 Hr x 2 Medication Radiologist      . ondansetron (ZOFRAN) injection 4 mg  4 mg Intravenous Once Doug Sou, MD   4 mg at 09/11/11 2302  . ondansetron (ZOFRAN) injection 4 mg  4 mg Intravenous Q6H PRN Doug Sou, MD   4 mg at  09/12/11 0033  . potassium chloride 10 mEq in 100 mL IVPB  10 mEq Intravenous Q1 Hr x 4 Doug Sou, MD   10 mEq at 09/12/11 0124  . potassium chloride SA (K-DUR,KLOR-CON) 20 MEQ CR tablet           . DISCONTD: furosemide (LASIX) 20 MG tablet            Medications Prior to Admission  Medication Sig Dispense Refill  . atorvastatin (LIPITOR) 20 MG tablet Take 1 tablet (20 mg total) by mouth at bedtime.      Marland Kitchen esomeprazole (NEXIUM) 40 MG capsule Take 40 mg by mouth daily before breakfast.        . estrogens conjugated, synthetic A, (CENESTIN) 0.9 MG tablet Take 0.9 mg by mouth daily.          . metFORMIN (GLUCOPHAGE-XR) 500 MG 24 hr tablet Take 500 mg by mouth daily with breakfast.       . metoprolol (LOPRESSOR) 50 MG tablet Take 50 mg by mouth daily.       . sucralfate (CARAFATE) 1 GM/10ML suspension Take 10 mLs (1 g total) by mouth 4 (four) times daily.  420 mL  0  . vitamin B-12 (CYANOCOBALAMIN) 1000 MCG tablet Take 1,000 mcg by mouth daily.        . vitamin C (ASCORBIC ACID) 500 MG tablet Take 1,000 mg by mouth daily.          Results for orders placed during the hospital encounter of 09/11/11 (from the past 48 hour(s))  CBC     Status: Abnormal   Collection Time   09/11/11  7:40 PM      Component Value Range Comment   WBC 12.8 (*) 4.0 - 10.5 (K/uL)    RBC 5.57 (*) 3.87 - 5.11 (MIL/uL)    Hemoglobin 15.6 (*) 12.0 - 15.0 (g/dL)    HCT 25.9  56.3 - 87.5 (%)    MCV 81.5  78.0 - 100.0 (fL)    MCH 28.0  26.0 - 34.0 (pg)    MCHC 34.4  30.0 - 36.0 (g/dL)    RDW 64.3  32.9 - 51.8 (%)    Platelets 274  150 - 400 (K/uL)   DIFFERENTIAL     Status: Abnormal   Collection Time   09/11/11  7:40 PM      Component Value Range Comment   Neutrophils Relative 62  43 - 77 (%)    Neutro Abs 7.9 (*) 1.7 - 7.7 (K/uL)    Lymphocytes Relative 28  12 - 46 (%)    Lymphs Abs 3.6  0.7 - 4.0 (K/uL)    Monocytes Relative 8  3 - 12 (%)    Monocytes Absolute 1.0  0.1 - 1.0 (K/uL)    Eosinophils Relative 2  0 - 5 (%)    Eosinophils Absolute 0.2  0.0 - 0.7 (K/uL)    Basophils Relative 0  0 - 1 (%)    Basophils Absolute 0.0  0.0 - 0.1 (K/uL)   BASIC METABOLIC PANEL     Status: Abnormal   Collection Time   09/11/11  7:40 PM      Component Value Range Comment   Sodium 140  135 - 145 (mEq/L)    Potassium 3.0 (*) 3.5 - 5.1 (mEq/L)    Chloride 93 (*) 96 - 112 (mEq/L)    CO2 27  19 - 32 (mEq/L)    Glucose, Bld 173 (*) 70 - 99 (mg/dL)  BUN 14  6 - 23 (mg/dL)    Creatinine, Ser 7.82 (*) 0.50 - 1.10 (mg/dL)    Calcium 95.6 (*) 8.4 - 10.5 (mg/dL)    GFR calc non Af Amer 49 (*) >90 (mL/min)    GFR  calc Af Amer 57 (*) >90 (mL/min)   LIPASE, BLOOD     Status: Normal   Collection Time   09/11/11 10:59 PM      Component Value Range Comment   Lipase 28  11 - 59 (U/L)   HEPATIC FUNCTION PANEL     Status: Abnormal   Collection Time   09/11/11 10:59 PM      Component Value Range Comment   Total Protein 7.9  6.0 - 8.3 (g/dL)    Albumin 3.9  3.5 - 5.2 (g/dL)    AST 57 (*) 0 - 37 (U/L)    ALT 33  0 - 35 (U/L)    Alkaline Phosphatase 186 (*) 39 - 117 (U/L)    Total Bilirubin 0.5  0.3 - 1.2 (mg/dL)    Bilirubin, Direct 0.1  0.0 - 0.3 (mg/dL)    Indirect Bilirubin 0.4  0.3 - 0.9 (mg/dL)   MAGNESIUM     Status: Normal   Collection Time   09/11/11 11:45 PM      Component Value Range Comment   Magnesium 1.5  1.5 - 2.5 (mg/dL)   URINALYSIS, ROUTINE W REFLEX MICROSCOPIC     Status: Abnormal   Collection Time   09/12/11 12:49 AM      Component Value Range Comment   Color, Urine AMBER (*) YELLOW  BIOCHEMICALS MAY BE AFFECTED BY COLOR   APPearance CLOUDY (*) CLEAR     Specific Gravity, Urine 1.026  1.005 - 1.030     pH 6.0  5.0 - 8.0     Glucose, UA NEGATIVE  NEGATIVE (mg/dL)    Hgb urine dipstick SMALL (*) NEGATIVE     Bilirubin Urine MODERATE (*) NEGATIVE     Ketones, ur 15 (*) NEGATIVE (mg/dL)    Protein, ur 213 (*) NEGATIVE (mg/dL)    Urobilinogen, UA 1.0  0.0 - 1.0 (mg/dL)    Nitrite NEGATIVE  NEGATIVE     Leukocytes, UA MODERATE (*) NEGATIVE    URINE MICROSCOPIC-ADD ON     Status: Abnormal   Collection Time   09/12/11 12:49 AM      Component Value Range Comment   Squamous Epithelial / LPF MANY (*) RARE     WBC, UA 7-10  <3 (WBC/hpf)    RBC / HPF 3-6  <3 (RBC/hpf)    Bacteria, UA FEW (*) RARE     Casts GRANULAR CAST (*) NEGATIVE  HYALINE CASTS   No results found.  Review of Systems  Constitutional: Negative.   HENT: Negative.   Eyes: Negative.   Respiratory: Negative.   Cardiovascular: Negative.   Gastrointestinal: Positive for nausea and vomiting.  Genitourinary: Negative.    Musculoskeletal: Negative.   Skin: Negative.   Neurological: Negative.   Endo/Heme/Allergies: Negative.   Psychiatric/Behavioral: Negative.     Blood pressure 104/69, pulse 104, temperature 97.9 F (36.6 C), temperature source Oral, resp. rate 18, SpO2 98.00%. Physical Exam  Constitutional: She is oriented to person, place, and time. She appears well-developed and well-nourished. No distress.  HENT:  Head: Normocephalic and atraumatic.  Right Ear: External ear normal.  Left Ear: External ear normal.  Nose: Nose normal.  Mouth/Throat: Oropharynx is clear and moist. No oropharyngeal exudate.  Eyes:  Conjunctivae and EOM are normal. Pupils are equal, round, and reactive to light. Right eye exhibits no discharge. Left eye exhibits no discharge. No scleral icterus.  Neck: Normal range of motion. Neck supple.  Cardiovascular: Regular rhythm and normal heart sounds.        Mildly tachycardic.  Respiratory: Effort normal and breath sounds normal. No respiratory distress. She has no wheezes. She has no rales.  GI: Soft. Bowel sounds are normal. She exhibits no distension. There is no tenderness. There is no rebound and no guarding.  Musculoskeletal: Normal range of motion. She exhibits no edema and no tenderness.  Neurological: She is alert and oriented to person, place, and time.       Moves upper and lower extremities.  Skin: She is not diaphoretic.  Psychiatric: Her behavior is normal.     Assessment/Plan #1. Persistent nausea and vomiting with recent ERCP status post stent placement for bile leak after cholecystectomy - we will keep patient n.p.o. CAT scan abdomen and pelvis has been ordered we will follow. Follow gastroenterology consult. #2. UTI - place patient on ceftriaxone and get urine cultures. #3. Hypercalcemia - probably from dehydration. Aggressively hydrate and recheck metabolic panel in a.m. #4. History of diabetes mellitus type 2  - will place patient on sliding scale  coverage.   CODE STATUS  - full code.   Daymein Nunnery N. 09/12/2011, 1:41 AM

## 2011-09-12 NOTE — ED Notes (Signed)
[name] [phone]. Wants to be contacted when pt gets admitted and has a room.

## 2011-09-12 NOTE — ED Notes (Signed)
[name] called and given room number

## 2011-09-12 NOTE — Consult Note (Signed)
[facility] Consult Note  Referring Provider: No ref. provider found Primary Care Physician:  Criss Rosales, MD Primary Gastroenterologist:  Dr.  Antony Contras Complaint: Nausea and vomiting HPI: [name] is an 61 y.o. white female.  Who is admitted with ongoing nausea and vomiting and weight loss. She states that this seemed to occur shortly after she had a hysterectomy several months ago. In the interim she developed abdominal pain and was worked up and found to have gallstones and underwent cholecystectomy on December 20. On December 24 she returned to her local emergency room with increased abdominal pain nausea and vomiting and a HIDA scan showed a bile leak. She was transferred to this hospital and underwent ERCP on December 25 with sphincterotomy and stent placement although no leak was clearly identified. She did well after that with her abdominal pain but continued to note that she had been having intermittent nausea and vomiting well before the cholecystectomy. She was treated with proton pump inhibitor and discharged but has continued to have intermittent nausea and vomiting. I saw her in the outpatient setting once and we try her on Reglan and ordered a gastric emptying scan but she was referred here for admission due to worsening nausea and vomiting. She had an abdominal CT scan that showed minimal stranding of the gallbladder fossa with no obvious leak and the previously placed stent appeared to be in good position with no ductal dilatation. Her liver function tests were normal. Her white blood cell count was slightly elevated at 12,800. She continues to complain of intermittent nausea and vomiting but no significant abdominal pain.  Past Medical History  Diagnosis Date  . Hydronephrosis   . Hyperlipidemia   . GERD (gastroesophageal reflux disease)   . Diabetes mellitus   . Hypertension   . Depression   . Headache   . Arthritis     fingers    Past Surgical History    Procedure Date  . Ureteroscopy  4/12, 2/12, 3/10    X 3 SURGERIES  FOR KIDNEY STONES  . Cystoscopy     X 3  . Laparoscopic assisted vaginal hysterectomy 07/13/2011    Procedure: LAPAROSCOPIC ASSISTED VAGINAL HYSTERECTOMY;  Surgeon: Melony Overly;  Location: [facility];  Service: Gynecology;  Laterality: N/A;  . Salpingoophorectomy 07/13/2011    Procedure: SALPINGO OOPHERECTOMY;  Surgeon: Melony Overly;  Location: [facility];  Service: Gynecology;  Laterality: Bilateral;  . Anterior and posterior repair 07/13/2011    Procedure: ANTERIOR (CYSTOCELE) AND POSTERIOR REPAIR (RECTOCELE);  Surgeon: Melony Overly;  Location: [facility];  Service: Gynecology;  Laterality: N/A;  . Bladder suspension 07/13/2011    Procedure: TRANSVAGINAL TAPE (TVT) PROCEDURE;  Surgeon: Melony Overly;  Location: [facility];  Service: Gynecology;  Laterality: N/A;  . Cystoscopy 07/13/2011    Procedure: CYSTOSCOPY;  Surgeon: Melony Overly;  Location: [facility];  Service: Gynecology;  Laterality: N/A;  . Cholecystectomy   . Ercp 08/22/2011    Procedure: ENDOSCOPIC RETROGRADE CHOLANGIOPANCREATOGRAPHY (ERCP);  Surgeon: Barrie Folk, MD;  Location: [facility] ENDOSCOPY;  Service: Endoscopy;  Laterality: N/A;  . Biliary stent placement 08/22/2011    Procedure: BILIARY STENT PLACEMENT;  Surgeon: Barrie Folk, MD;  Location: [facility] ENDOSCOPY;  Service: Endoscopy;  Laterality: N/A;  66f x 5cm stent for bile leak. placed in CBD    Medications Prior to Admission  Medication Dose Route Frequency Provider Last Rate Last Dose  . 0.45 % NaCl with KCl 20 mEq / L infusion  Intravenous Continuous Eduard Clos 125 mL/hr at 09/12/11 0559    . acetaminophen (TYLENOL) tablet 650 mg  650 mg Oral Q6H PRN Eduard Clos       Or  . acetaminophen (TYLENOL) suppository 650 mg  650 mg Rectal Q6H PRN Eduard Clos      . cefTRIAXone (ROCEPHIN) 1 g in dextrose 5 % 50 mL IVPB  1 g Intravenous Q24H Arshad N. Kakrakandy   1 g at 09/12/11 0558  . insulin  aspart (novoLOG) injection 0-9 Units  0-9 Units Subcutaneous TID WC Eduard Clos      . iohexol (OMNIPAQUE) 300 MG/ML solution 80 mL  80 mL Intravenous Once PRN Medication Radiologist   80 mL at 09/12/11 0312  . ondansetron (ZOFRAN) injection 4 mg  4 mg Intravenous Once Doug Sou, MD   4 mg at 09/11/11 2302  . ondansetron (ZOFRAN) tablet 4 mg  4 mg Oral Q6H PRN Eduard Clos       Or  . ondansetron (ZOFRAN) injection 4 mg  4 mg Intravenous Q6H PRN Eduard Clos      . potassium chloride 10 mEq in 100 mL IVPB  10 mEq Intravenous Q1 Hr x 4 Doug Sou, MD   10 mEq at 09/12/11 0231  . potassium chloride SA (K-DUR,KLOR-CON) 20 MEQ CR tablet           . DISCONTD: 0.9 %  sodium chloride infusion   Intravenous Continuous Doug Sou, MD 250 mL/hr at 09/12/11 0109    . DISCONTD: 0.9 %  sodium chloride infusion   Intravenous STAT Doug Sou, MD      . DISCONTD: furosemide (LASIX) 20 MG tablet           . DISCONTD: iohexol (OMNIPAQUE) 300 MG/ML solution 20 mL  20 mL Oral Q1 Hr x 2 Medication Radiologist      . DISCONTD: ondansetron (ZOFRAN) injection 4 mg  4 mg Intravenous Q6H PRN Doug Sou, MD   4 mg at 09/12/11 0033   Medications Prior to Admission  Medication Sig Dispense Refill  . atorvastatin (LIPITOR) 20 MG tablet Take 1 tablet (20 mg total) by mouth at bedtime.      Marland Kitchen esomeprazole (NEXIUM) 40 MG capsule Take 40 mg by mouth daily before breakfast.        . estrogens conjugated, synthetic A, (CENESTIN) 0.9 MG tablet Take 0.9 mg by mouth daily.        . metFORMIN (GLUCOPHAGE-XR) 500 MG 24 hr tablet Take 500 mg by mouth daily with breakfast.       . metoprolol (LOPRESSOR) 50 MG tablet Take 50 mg by mouth daily.       . sucralfate (CARAFATE) 1 GM/10ML suspension Take 10 mLs (1 g total) by mouth 4 (four) times daily.  420 mL  0  . vitamin B-12 (CYANOCOBALAMIN) 1000 MCG tablet Take 1,000 mcg by mouth daily.        . vitamin C (ASCORBIC ACID) 500 MG tablet Take  1,000 mg by mouth daily.          Allergies:  Allergies  Allergen Reactions  . Flomax (Tamsulosin Hcl) Swelling    Pt. Had swelling of jaw  . Sulfa Antibiotics     History reviewed. No pertinent family history.  Social History:  reports that she has never smoked. She does not have any smokeless tobacco history on file. She reports that she does not drink alcohol or use illicit drugs.  Negative except for as outlined above   Blood pressure 100/71, pulse 102, temperature 98.1 F (36.7 C), temperature source Oral, resp. rate 20, height 5\' 4"  (1.626 m), weight 57.2 kg (126 lb 1.7 oz), SpO2 100.00%. Head: Normocephalic, without obvious abnormality, atraumatic Neck: no adenopathy, no carotid bruit, no JVD, supple, symmetrical, trachea midline and thyroid not enlarged, symmetric, no tenderness/mass/nodules Resp: clear to auscultation bilaterally Cardio: regular rate and rhythm, S1, S2 normal, no murmur, click, rub or gallop GI: Soft nondistended with normoactive bowel sounds. There is mild diffuse upper abdominal tenderness without guarding or rebound Extremities: extremities normal, atraumatic, no cyanosis or edema  Results for orders placed during the hospital encounter of 09/11/11 (from the past 48 hour(s))  CBC     Status: Abnormal   Collection Time   09/11/11  7:40 PM      Component Value Range Comment   WBC 12.8 (*) 4.0 - 10.5 (K/uL)    RBC 5.57 (*) 3.87 - 5.11 (MIL/uL)    Hemoglobin 15.6 (*) 12.0 - 15.0 (g/dL)    HCT 16.1  09.6 - 04.5 (%)    MCV 81.5  78.0 - 100.0 (fL)    MCH 28.0  26.0 - 34.0 (pg)    MCHC 34.4  30.0 - 36.0 (g/dL)    RDW 40.9  81.1 - 91.4 (%)    Platelets 274  150 - 400 (K/uL)   DIFFERENTIAL     Status: Abnormal   Collection Time   09/11/11  7:40 PM      Component Value Range Comment   Neutrophils Relative 62  43 - 77 (%)    Neutro Abs 7.9 (*) 1.7 - 7.7 (K/uL)    Lymphocytes Relative 28  12 - 46 (%)    Lymphs Abs 3.6  0.7 - 4.0 (K/uL)    Monocytes  Relative 8  3 - 12 (%)    Monocytes Absolute 1.0  0.1 - 1.0 (K/uL)    Eosinophils Relative 2  0 - 5 (%)    Eosinophils Absolute 0.2  0.0 - 0.7 (K/uL)    Basophils Relative 0  0 - 1 (%)    Basophils Absolute 0.0  0.0 - 0.1 (K/uL)   BASIC METABOLIC PANEL     Status: Abnormal   Collection Time   09/11/11  7:40 PM      Component Value Range Comment   Sodium 140  135 - 145 (mEq/L)    Potassium 3.0 (*) 3.5 - 5.1 (mEq/L)    Chloride 93 (*) 96 - 112 (mEq/L)    CO2 27  19 - 32 (mEq/L)    Glucose, Bld 173 (*) 70 - 99 (mg/dL)    BUN 14  6 - 23 (mg/dL)    Creatinine, Ser 7.82 (*) 0.50 - 1.10 (mg/dL)    Calcium 95.6 (*) 8.4 - 10.5 (mg/dL)    GFR calc non Af Amer 49 (*) >90 (mL/min)    GFR calc Af Amer 57 (*) >90 (mL/min)   LIPASE, BLOOD     Status: Normal   Collection Time   09/11/11 10:59 PM      Component Value Range Comment   Lipase 28  11 - 59 (U/L)   HEPATIC FUNCTION PANEL     Status: Abnormal   Collection Time   09/11/11 10:59 PM      Component Value Range Comment   Total Protein 7.9  6.0 - 8.3 (g/dL)    Albumin 3.9  3.5 - 5.2 (g/dL)  AST 57 (*) 0 - 37 (U/L)    ALT 33  0 - 35 (U/L)    Alkaline Phosphatase 186 (*) 39 - 117 (U/L)    Total Bilirubin 0.5  0.3 - 1.2 (mg/dL)    Bilirubin, Direct 0.1  0.0 - 0.3 (mg/dL)    Indirect Bilirubin 0.4  0.3 - 0.9 (mg/dL)   MAGNESIUM     Status: Normal   Collection Time   09/11/11 11:45 PM      Component Value Range Comment   Magnesium 1.5  1.5 - 2.5 (mg/dL)   URINALYSIS, ROUTINE W REFLEX MICROSCOPIC     Status: Abnormal   Collection Time   09/12/11 12:49 AM      Component Value Range Comment   Color, Urine AMBER (*) YELLOW  BIOCHEMICALS MAY BE AFFECTED BY COLOR   APPearance CLOUDY (*) CLEAR     Specific Gravity, Urine 1.026  1.005 - 1.030     pH 6.0  5.0 - 8.0     Glucose, UA NEGATIVE  NEGATIVE (mg/dL)    Hgb urine dipstick SMALL (*) NEGATIVE     Bilirubin Urine MODERATE (*) NEGATIVE     Ketones, ur 15 (*) NEGATIVE (mg/dL)    Protein, ur  161 (*) NEGATIVE (mg/dL)    Urobilinogen, UA 1.0  0.0 - 1.0 (mg/dL)    Nitrite NEGATIVE  NEGATIVE     Leukocytes, UA MODERATE (*) NEGATIVE    URINE MICROSCOPIC-ADD ON     Status: Abnormal   Collection Time   09/12/11 12:49 AM      Component Value Range Comment   Squamous Epithelial / LPF MANY (*) RARE     WBC, UA 7-10  <3 (WBC/hpf)    RBC / HPF 3-6  <3 (RBC/hpf)    Bacteria, UA FEW (*) RARE     Casts GRANULAR CAST (*) NEGATIVE  HYALINE CASTS  GLUCOSE, CAPILLARY     Status: Abnormal   Collection Time   09/12/11  8:04 AM      Component Value Range Comment   Glucose-Capillary 119 (*) 70 - 99 (mg/dL)    Ct Abdomen Pelvis W Contrast  09/12/2011  *RADIOLOGY REPORT*  Clinical Data: Vomiting; unable to eat or drink for 4 days.  Weight loss.  CT ABDOMEN AND PELVIS WITH CONTRAST  Technique:  Multidetector CT imaging of the abdomen and pelvis was performed following the standard protocol during bolus administration of intravenous contrast.  Contrast: 80mL OMNIPAQUE IOHEXOL 300 MG/ML IV SOLN  Comparison: Abdominal radiograph performed 08/23/2011, and CT of the abdomen and pelvis performed 02/20/2011  Findings: The visualized lung bases are clear.  The patient appears status post recent cholecystectomy, with residual soft tissue stranding noted at the gallbladder fossa, and associated clips.  A stent is noted within the common hepatic and common bile duct, seen in expected position.  There is no evidence of intrahepatic biliary ductal dilatation.  The liver is unremarkable in appearance.  There is no evidence of significant bile leak.  No free fluid is noted about the liver.  A calcification within the spleen is likely vascular in nature. Scattered tiny hypodensities within the spleen are nonspecific but may reflect cysts.  The pancreas is unremarkable in appearance. The adrenal glands are within normal limits.  Residual pelvicaliectasis is noted at the right kidney; this is markedly improved from the chronic  severe hydronephrosis noted on the prior CT.  There is mild right renal atrophy.  A few small nonobstructing left renal stones  are seen, measuring up to 3 mm in size.  A 7 mm cyst is noted at the interpole region of the right kidney.  No perinephric stranding is seen.  No free fluid is identified.  The small bowel is unremarkable in appearance.  The stomach is within normal limits.  No acute vascular abnormalities are seen.  Scattered calcification is noted along the distal abdominal aorta and its branches.  Soft tissue inflammation at the umbilicus likely reflects recent trocar placement for cholecystectomy.  The appendix is normal in caliber, without evidence for appendicitis.  The colon is largely decompressed.  Minimal diverticulosis is noted along the descending and proximal sigmoid colon, without evidence of diverticulitis.  The bladder is decompressed and not well assessed.  The patient is status post hysterectomy; no suspicious adnexal masses are seen. No inguinal lymphadenopathy is seen.  No acute osseous abnormalities are identified.  Disc space narrowing and vacuum phenomenon are noted at L5-S1, with associated endplate sclerotic change.  IMPRESSION:  1.  No acute abnormalities seen within the abdomen or pelvis. 2.  Findings of recent cholecystectomy, with mild residual soft tissue inflammation at the gallbladder fossa; no evidence of bile leak.  Associated soft tissue inflammation at the umbilicus likely reflects recent trocar placement. 3.  Stent noted within the common hepatic and common bile duct, in expected position.  No evidence for obstruction. 4.  Residual pelvicaliectasis at the right kidney is markedly improved from chronic severe hydronephrosis noted on the prior CT; mild right renal atrophy noted. 5.  Few small nonobstructing left renal stones, measuring up to 7 mm in size; small right renal cyst seen. 6.  Minimal diverticulosis along the descending and proximal sigmoid colon, without  evidence of diverticulitis. 7.  Question of tiny splenic cysts.  Original Report Authenticated By: Tonia Ghent, M.D.    Assessment: Persistent nausea and vomiting apparently independent of cholecystectomy, subsequent bile leak and predating both of those events, etiology remains unclear. Plan:  Will proceed with nuclear medicine gastric emptying study if she can tolerate it. At this point given absence of any other GI causes will even consider head CT scan to rule out a CNS source of nausea and vomiting. Jazlen Ogarro C 09/12/2011, 8:32 AM

## 2011-09-12 NOTE — Progress Notes (Signed)
Patient seen and examined. Agree with Algis Downs, PA assessment and plan. Continue Rocephin for UTI; follow CT head and gastric emptying nuclear scan. Will continue zofran and phenergan as needed for N/V.   Vassie Loll, MD [phone]

## 2011-09-13 ENCOUNTER — Inpatient Hospital Stay (HOSPITAL_COMMUNITY): Payer: BC Managed Care – PPO

## 2011-09-13 LAB — BASIC METABOLIC PANEL
Calcium: 9.2 mg/dL (ref 8.4–10.5)
GFR calc non Af Amer: 79 mL/min — ABNORMAL LOW (ref >90)
Glucose, Bld: 90 mg/dL (ref 70–99)
Sodium: 137 mEq/L (ref 135–145)

## 2011-09-13 LAB — GLUCOSE, CAPILLARY
Glucose-Capillary: 88 mg/dL (ref 70–99)
Glucose-Capillary: 89 mg/dL (ref 70–99)

## 2011-09-13 MED ORDER — METOCLOPRAMIDE HCL 5 MG/ML IJ SOLN
10.0000 mg | Freq: Three times a day (TID) | INTRAMUSCULAR | Status: DC
  Administered 2011-09-13 – 2011-09-15 (×9): 10 mg via INTRAVENOUS
  Filled 2011-09-13 (×12): qty 2

## 2011-09-13 NOTE — Progress Notes (Signed)
Patient seen and examined; symptoms slightly better. She was unable to tolerate nuclear scan test; at this point will treat empirically with reglan QID. Per GI probably EGD as an inpatient; will follow final recommendations. CT of head normal.

## 2011-09-13 NOTE — Progress Notes (Signed)
INITIAL ADULT NUTRITION ASSESSMENT Date: 09/13/2011   Time: 2:28 PM Reason for Assessment: consult- unintentional weight loss  ASSESSMENT: Female 61 y.o.  Dx: Nausea and vomiting  Hx:  Past Medical History  Diagnosis Date  . Hydronephrosis   . Hyperlipidemia   . GERD (gastroesophageal reflux disease)   . Diabetes mellitus   . Hypertension   . Depression   . Headache   . Arthritis     fingers   Related Meds:     . cefTRIAXone (ROCEPHIN)  IV  1 g Intravenous Q24H  . insulin aspart  0-9 Units Subcutaneous TID WC  . metoCLOPramide (REGLAN) injection  10 mg Intravenous TID AC & HS  . pantoprazole (PROTONIX) IV  40 mg Intravenous Q12H    Ht: 5\' 4"  (162.6 cm)  Wt: 157 lb (71.215 kg) (spoke to RN re weight of 127 lb on 1/15, that weight is not accurate)  Ideal Wt: 54.5 kg % Ideal Wt: 130%  Usual Wt: 180 lb/ 81.8 kg per pt % Usual Wt: 87%  Body mass index is 26.95 kg/(m^2). overweight  Food/Nutrition Related Hx: Per conversation with pt and pt husband, n/v and weight loss began in November following a hysterectomy. Prior to this, pt ate well and weight was steady. While speaking to pt and her husband, pt had to cough up thick mucous several times. Pt became nauseous after speaking for a few minutes; pt did not vomit only spit up white liquid. Pt husband reports this happens frequently when pt tries to talk.   Asked pt if she has any trouble swallowing her food; pt stated she did, when she put the food in her mouth and moved it around she would immediately have to spit it out.  Labs:  CMP     Component Value Date/Time   NA 137 09/13/2011 0515   K 3.6 09/13/2011 0515   CL 102 09/13/2011 0515   CO2 22 09/13/2011 0515   GLUCOSE 90 09/13/2011 0515   BUN 9 09/13/2011 0515   CREATININE 0.80 09/13/2011 0515   CALCIUM 9.2 09/13/2011 0515   PROT 7.0 09/12/2011 1002   ALBUMIN 3.3* 09/12/2011 1002   AST 41* 09/12/2011 1002   ALT 25 09/12/2011 1002   ALKPHOS 161* 09/12/2011 1002   BILITOT 0.4 09/12/2011 1002   GFRNONAA 79* 09/13/2011 0515   GFRAA >90 09/13/2011 0515    CBG (last 3)   Basename 09/13/11 1211 09/13/11 0818 09/13/11 0351  GLUCAP 80 99 89    Intake/Output Summary (Last 24 hours) at 09/13/11 1447 Last data filed at 09/13/11 1300  Gross per 24 hour  Intake 3161.16 ml  Output    150 ml  Net 3011.16 ml   Diet Order: Clear Liquid  IVF:    0.45 % NaCl with KCl 20 mEq / L Last Rate: 50 mL/hr at 09/13/11 1024    Estimated Nutritional Needs:   Kcal: 1500-1700 Protein:70-85 g Fluid: 1.5 L Per RN will be advancing diet to Regular, per pt request. Pt will continue to be given Reglan to help with n/v.  Pt is at nutrition risk due to recent weight loss with poor oral intake for the past 2 months.  NUTRITION DIAGNOSIS: -Inadequate oral intake (NI-2.1).  Status: Ongoing  RELATED TO:  n/v, poor appetite  AS EVIDENCE BY: 26 lb weight loss in past 2 months  MONITORING/EVALUATION(Goals): Goal: Increase PO intake to >50% of meals to better meet estimated needs. Monitor: diet advancement, labs, weight trends, PO intake  EDUCATION NEEDS: -Education not appropriate at this time  INTERVENTION:  Discussed initiating supplements to help with intake. Pt declined; pt stated she wants to try eating a regular diet.  Dietitian #:[phone]  DOCUMENTATION CODES Per approved criteria  -Severe malnutrition in the context of chronic illness    Karenann Cai 09/13/2011, 2:28 PM  Hettie Holstein [phone]

## 2011-09-13 NOTE — Progress Notes (Signed)
Patient ID: [name], female   DOB: 27-Mar-1951, 61 y.o.   MRN: [id number]  60 yo female with h/o DM and Genella Rife, underwent Hysterectomy & "bladder lift" on 07/12/12.  Continued to feel poorly.  Had cholecystectomy 08/16/12.  Developed bile leak.  Stents were placed on 08/21/12.  Has continued to have worsening N/V.  Reports 30 pound weight loss since 11/15.  Subjective: Unable to eat egg for gastric emptying scan.  Still vomiting all PO's, reports she is unable to take pills.  +BM this morning. Describes "Swimmy Headedness" on standing.  The patient raised the possibility of eating disorder - "I don't think I have an eating disorder - I want to eat & I"m a happy person".  Objective: Weight change:   Intake/Output Summary (Last 24 hours) at 09/13/11 1019 Last data filed at 09/13/11 0653  Gross per 24 hour  Intake 2781.16 ml  Output    150 ml  Net 2631.16 ml   Blood pressure 115/72, pulse 83, temperature 97.9 F (36.6 C), temperature source Oral, resp. rate 19, height 5\' 4"  (1.626 m), weight 57.2 kg (126 lb 1.7 oz), SpO2 100.00%. Filed Vitals:   09/12/11 0335 09/12/11 1340 09/12/11 2100 09/13/11 0500  BP: 100/71 101/67 106/69 115/72  Pulse: 102 86 78 83  Temp: 98.1 F (36.7 C) 98 F (36.7 C) 98 F (36.7 C) 97.9 F (36.6 C)  TempSrc: Oral Oral Oral Oral  Resp: 20 18 19 19   Height: 5\' 4"  (1.626 m)     Weight: 57.2 kg (126 lb 1.7 oz)     SpO2: 100% 100% 100% 100%    WEIGHT:  09/12/11 -   126 lbs PCP Office:  08/21/11 -  164 lbs  PCP Office:  09/05/11 -   151 lbs  Will re-verify her weight today.  Physical Exam: General: No acute distress,  A&O x 3 Lungs: Clear to auscultation bilaterally without wheezes or crackles Cardiovascular: Regular rate and rhythm without murmur gallop or rub normal S1 and S2 Abdomen:   Mild tenderness over the bladder area, nondistended, soft, bowel sounds positive, no rebound, no ascites, no appreciable mass.  Surgical incisions well  healed. Extremities: No significant cyanosis, clubbing, or edema bilateral lower extremities  Basic Metabolic Panel:  Lab 09/13/11 4098 09/12/11 1002 09/11/11 2345  NA 137 137 --  K 3.6 3.6 --  CL 102 98 --  CO2 22 24 --  GLUCOSE 90 123* --  BUN 9 14 --  CREATININE 0.80 0.94 --  CALCIUM 9.2 9.4 --  MG -- 1.4* 1.5  PHOS -- -- --   Liver Function Tests:  Lab 09/12/11 1002 09/11/11 2259  AST 41* 57*  ALT 25 33  ALKPHOS 161* 186*  BILITOT 0.4 0.5  PROT 7.0 7.9  ALBUMIN 3.3* 3.9    Lab 09/13/11 0515 09/11/11 2259  LIPASE 29 28  AMYLASE -- --   CBC:  Lab 09/12/11 1002 09/11/11 1940  WBC 8.3 12.8*  NEUTROABS -- 7.9*  HGB 12.5 15.6*  HCT 37.1 45.4  MCV 81.2 81.5  PLT 211 274   CBG:  Lab 09/13/11 0818 09/13/11 0351 09/12/11 2359 09/12/11 2010 09/12/11 1700 09/12/11 1129  GLUCAP 99 89 88 75 99 114*   Urinalysis: 09/12/11  Ref. Range 09/12/2011 00:49  Color, Urine Latest Range: YELLOW  AMBER (A)  APPearance Latest Range: CLEAR  CLOUDY (A)  Specific Gravity, Urine Latest Range: 1.005-1.030  1.026  pH Latest Range: 5.0-8.0  6.0  Glucose, UA Latest  Range: NEGATIVE mg/dL NEGATIVE  Bilirubin Urine Latest Range: NEGATIVE  MODERATE (A)  Ketones, ur Latest Range: NEGATIVE mg/dL 15 (A)  Protein Latest Range: NEGATIVE mg/dL 213 (A)  Urobilinogen, UA Latest Range: 0.0-1.0 mg/dL 1.0  Nitrite Latest Range: NEGATIVE  NEGATIVE  Leukocytes, UA Latest Range: NEGATIVE  MODERATE (A)  WBC, UA Latest Range: <3 WBC/hpf 7-10    Studies/Results:  09/12/11 CT Abd Pelvis:   1. No acute abnormalities seen within the abdomen or pelvis.  2. Findings of recent cholecystectomy, with mild residual soft tissue inflammation at the gallbladder fossa; no evidence of bile  leak. Associated soft tissue inflammation at the umbilicus likely reflects recent trocar placement.  3. Stent noted within the common hepatic and common bile duct, in expected position. No evidence for obstruction.     Scheduled Meds:    . cefTRIAXone (ROCEPHIN)  IV  1 g Intravenous Q24H  . insulin aspart  0-9 Units Subcutaneous TID WC  . metoCLOPramide (REGLAN) injection  10 mg Intravenous TID AC & HS  . pantoprazole (PROTONIX) IV  40 mg Intravenous Q12H   Continuous Infusions:    . 0.45 % NaCl with KCl 20 mEq / L 50 mL/hr at 09/13/11 1024   PRN Meds:.acetaminophen, acetaminophen, ondansetron (ZOFRAN) IV, ondansetron, promethazine  Assessment/Plan: Principal Problem:  *Nausea and vomiting Active Problems:  Diabetes mellitus  Hypercalcemia  UTI (lower urinary tract infection)  1.  N/V with dramatic weight loss.  [facility], Dr. Madilyn Fireman, is consulting.  We appreciate his recommendations.  Unable to do Gastric emptying scan.  Will start scheduled Reglan 10 mg qid.  Can not change IV meds to PO yet.  Spoke with PCP office regarding Weight.  Patient weighed 164 on 08/21/11 and on 09/12/11 weighs 126.  Almost a 40 lb weight loss in 3 weeks.  2.  UTI - on rocephin.  Culture:  25,000 COLONIES/ML Culture Multiple bacterial morphotypes present, none predominant. Suggest appropriate recollection if clinically indicated.  Awaiting results from 2nd culture.  3.  Possible vertigo vs poor po intake causing symptoms of dizziness.  Will not start Meclizine at this point as patient is about to start scheduled Reglan.  Can add Meclizine later if indicated.  PT eval ordered.  Head CT ordered.  3.  DM - diet controlled.  4.  Hypokalemia - repleted.  5. Dehydration - IV rehydration.  Decrease IVF to 50 ml/hr.    LOS: 2 days   Stephani Police 09/13/2011, 10:19 AM [phone]

## 2011-09-13 NOTE — Progress Notes (Signed)
Utilization Review Completed.[name] T1/16/2013   

## 2011-09-14 LAB — GLUCOSE, CAPILLARY
Glucose-Capillary: 94 mg/dL (ref 70–99)
Glucose-Capillary: 95 mg/dL (ref 70–99)
Glucose-Capillary: 77 mg/dL (ref 70–99)
Glucose-Capillary: 81 mg/dL (ref 70–99)

## 2011-09-14 LAB — BASIC METABOLIC PANEL
BUN: 6 mg/dL (ref 6–23)
GFR calc non Af Amer: >90 mL/min (ref >90)
Glucose, Bld: 101 mg/dL — ABNORMAL HIGH (ref 70–99)
Potassium: 4 mEq/L (ref 3.5–5.1)

## 2011-09-14 NOTE — Progress Notes (Signed)
Gave pt cotton ball with 3 drops of orange/ginger lend for nausea. Pt tolerating well.

## 2011-09-14 NOTE — Progress Notes (Signed)
Patient ID: [name], female   DOB: 1951-03-30, 61 y.o.   MRN: [id number]  61 yo female with h/o DM and Genella Rife, underwent Hysterectomy & "bladder lift" on 07/12/12.  Continued to feel poorly.  Had cholecystectomy 08/16/12.  Developed bile leak.  Stents were placed on 08/21/12.  Has continued to have worsening N/V.  Reports 30 pound weight loss since 11/15.  Subjective: No further nausea and vomiting, but is not taking any POs either.  Husband reports she looks much better than she did at admission.   Patient complaining of left flank pain.  Is concerned that she's missed her Urology appointment with Dr. Hillis Range because of hospitalizations.    Objective: Weight change:   Intake/Output Summary (Last 24 hours) at 09/14/11 1453 Last data filed at 09/14/11 1100  Gross per 24 hour  Intake 1215.67 ml  Output      0 ml  Net 1215.67 ml   Blood pressure 108/70, pulse 99, temperature 97.8 F (36.6 C), temperature source Oral, resp. rate 20, height 5\' 4"  (1.626 m), weight 71.215 kg (157 lb), SpO2 99.00%. Filed Vitals:   09/13/11 1100 09/13/11 1300 09/13/11 2118 09/14/11 0555  BP:  120/70 101/64 108/70  Pulse:  88 87 99  Temp:  97.8 F (36.6 C) 98.3 F (36.8 C) 97.8 F (36.6 C)  TempSrc:  Oral Oral Oral  Resp:  18 20 20   Height:      Weight: 71.215 kg (157 lb)     SpO2:  100% 99% 99%    WEIGHT:  09/13/11 -  157 lbs PCP Office:  08/21/11 -  164 lbs     Physical Exam: General: No acute distress,  A&O x 3, pleasant. Husband at bedside. Lungs: Clear to auscultation bilaterally without wheezes or crackles Cardiovascular: Regular rate and rhythm without murmur gallop or rub normal S1 and S2 Abdomen:   Mild tenderness over the bladder area, and mild left flank CVA tenderness. abd nondistended, soft, bowel sounds positive, no rebound, no ascites, no appreciable mass.  Surgical incisions well healed. Extremities: No significant cyanosis, clubbing, or edema bilateral lower extremities  Basic  Metabolic Panel:  Lab 09/14/11 7829 09/13/11 0515 09/12/11 1002 09/11/11 2345  NA 138 137 -- --  K 4.0 3.6 -- --  CL 103 102 -- --  CO2 22 22 -- --  GLUCOSE 101* 90 -- --  BUN 6 9 -- --  CREATININE 0.73 0.80 -- --  CALCIUM 9.4 9.2 -- --  MG -- -- 1.4* 1.5  PHOS -- -- -- --   Liver Function Tests:  Lab 09/12/11 1002 09/11/11 2259  AST 41* 57*  ALT 25 33  ALKPHOS 161* 186*  BILITOT 0.4 0.5  PROT 7.0 7.9  ALBUMIN 3.3* 3.9    Lab 09/13/11 0515 09/11/11 2259  LIPASE 29 28  AMYLASE -- --   CBC:  Lab 09/12/11 1002 09/11/11 1940  WBC 8.3 12.8*  NEUTROABS -- 7.9*  HGB 12.5 15.6*  HCT 37.1 45.4  MCV 81.2 81.5  PLT 211 274   CBG:  Lab 09/14/11 1218 09/14/11 0810 09/14/11 0040 09/13/11 1647 09/13/11 1211 09/13/11 0818  GLUCAP 95 94 87 81 80 99   Urinalysis: 09/12/11  Ref. Range 09/12/2011 00:49  Color, Urine Latest Range: YELLOW  AMBER (A)  APPearance Latest Range: CLEAR  CLOUDY (A)  Specific Gravity, Urine Latest Range: 1.005-1.030  1.026  pH Latest Range: 5.0-8.0  6.0  Glucose, UA Latest Range: NEGATIVE mg/dL NEGATIVE  Bilirubin Urine  Latest Range: NEGATIVE  MODERATE (A)  Ketones, ur Latest Range: NEGATIVE mg/dL 15 (A)  Protein Latest Range: NEGATIVE mg/dL 147 (A)  Urobilinogen, UA Latest Range: 0.0-1.0 mg/dL 1.0  Nitrite Latest Range: NEGATIVE  NEGATIVE  Leukocytes, UA Latest Range: NEGATIVE  MODERATE (A)  WBC, UA Latest Range: <3 WBC/hpf 7-10    Studies/Results:  09/12/11 CT Abd Pelvis:   1. No acute abnormalities seen within the abdomen or pelvis.  2. Findings of recent cholecystectomy, with mild residual soft tissue inflammation at the gallbladder fossa; no evidence of bile  leak. Associated soft tissue inflammation at the umbilicus likely reflects recent trocar placement.  3. Stent noted within the common hepatic and common bile duct, in expected position. No evidence for obstruction.   09/13/11 CT Brain:   IMPRESSION:  Normal noncontrast CT appearance of  the brain.     Scheduled Meds:    . cefTRIAXone (ROCEPHIN)  IV  1 g Intravenous Q24H  . insulin aspart  0-9 Units Subcutaneous TID WC  . metoCLOPramide (REGLAN) injection  10 mg Intravenous TID AC & HS  . pantoprazole (PROTONIX) IV  40 mg Intravenous Q12H   Continuous Infusions:    . 0.45 % NaCl with KCl 20 mEq / L 50 mL/hr at 09/14/11 1203   PRN Meds:.acetaminophen, acetaminophen, ondansetron (ZOFRAN) IV, ondansetron, promethazine  Assessment/Plan: Principal Problem:  *Nausea and vomiting Active Problems:  Diabetes mellitus  Hypercalcemia  UTI (lower urinary tract infection)  1.  N/V.  [facility], Dr. Madilyn Fireman, is consulting.  We appreciate his recommendations.  Unable to do Gastric emptying scan.  Will start scheduled Reglan 10 mg qid.  Can not change IV meds to PO yet.  Spoke with PCP office regarding Weight.  Upper EGD scheduled for 09/15/11.    2.  UTI - on rocephin.  Culture:  25,000 COLONIES/ML Culture Multiple bacterial morphotypes present, none predominant.  Will d/c Rocephin tomorrow after 3 day tx.  3.  Possible vertigo vs poor po intake causing symptoms of dizziness.    PT eval completed.  Vestibular Exercises practiced.  Head CT negative for CNS source of vomiting.  3.  DM - diet controlled. cbgs in the 90s to 100.  4.  Hypokalemia - repleted.  5. Dehydration - IV rehydration.  Decrease IVF to 20 ml/hr.  6.  Left sided flank pain with history of kidney stones.  Will check renal ultrasound.  Patient will need to follow up with urology outpatient after discharge pending the results of the U/S    LOS: 3 days   Stephani Police 09/14/2011, 2:53 PM [phone]

## 2011-09-14 NOTE — Progress Notes (Signed)
[facility] Progress Note  Subjective: Still having intermittent nausea and vomiting basically unchanged  Objective: Vital signs in last 24 hours: Temp:  [97.8 F (36.6 [name])-98.3 F (36.8 [name])] 97.8 F (36.6 [name]) (01/17 0555) Pulse Rate:  [87-99] 99  (01/17 0555) Resp:  [18-20] 20  (01/17 0555) BP: (101-120)/(64-70) 108/70 mmHg (01/17 0555) SpO2:  [99 %-100 %] 99 % (01/17 0555) Weight:  [71.215 kg (157 lb)] 71.215 kg (157 lb) (01/16 1100) Weight change:    PE: Unchanged  Lab Results: Results for orders placed during the hospital encounter of 09/11/11 (from the past 24 hour(s))  GLUCOSE, CAPILLARY     Status: Normal   Collection Time   09/13/11 12:11 PM      Component Value Range   Glucose-Capillary 80  70 - 99 (mg/dL)  GLUCOSE, CAPILLARY     Status: Normal   Collection Time   09/13/11  4:47 PM      Component Value Range   Glucose-Capillary 81  70 - 99 (mg/dL)  GLUCOSE, CAPILLARY     Status: Normal   Collection Time   09/14/11 12:40 AM      Component Value Range   Glucose-Capillary 87  70 - 99 (mg/dL)   Comment 1 Notify RN     Comment 2 Documented in Chart    BASIC METABOLIC PANEL     Status: Abnormal   Collection Time   09/14/11  6:30 AM      Component Value Range   Sodium 138  135 - 145 (mEq/L)   Potassium 4.0  3.5 - 5.1 (mEq/L)   Chloride 103  96 - 112 (mEq/L)   CO2 22  19 - 32 (mEq/L)   Glucose, Bld 101 (*) 70 - 99 (mg/dL)   BUN 6  6 - 23 (mg/dL)   Creatinine, Ser 4.54  0.50 - 1.10 (mg/dL)   Calcium 9.4  8.4 - 09.8 (mg/dL)   GFR calc non Af Amer >90  >90 (mL/min)   GFR calc Af Amer >90  >90 (mL/min)  GLUCOSE, CAPILLARY     Status: Normal   Collection Time   09/14/11  8:10 AM      Component Value Range   Glucose-Capillary 94  70 - 99 (mg/dL)    Studies/Results: Ct Head Wo Contrast  09/13/2011  *RADIOLOGY REPORT*  Clinical Data: 61 year old female with nausea, vomiting, dizziness.  CT HEAD WITHOUT CONTRAST  Technique:  Contiguous axial images were  obtained from the base of the skull through the vertex without contrast.  Comparison: None.  Findings: Visualized paranasal sinuses and mastoids are clear. Visualized orbits and scalp soft tissues are within normal limits. No acute osseous abnormality identified.  Cerebral volume is within normal limits for age.  No midline shift, ventriculomegaly, mass effect, evidence of mass lesion, intracranial hemorrhage or evidence of cortically based acute infarction.  Gray-white matter differentiation is within normal limits throughout the brain.  No suspicious intracranial vascular hyperdensity.  IMPRESSION: Normal noncontrast CT appearance of the brain.  Original Report Authenticated By: Harley Hallmark, M.D.      Assessment: Nausea and vomiting etiology unclear, status post cholecystectomy bile leak and stent placement  Plan: Performed EGD tomorrow to make sure no pathology was missed from previous ERCP.    [name] 09/14/2011, 9:28 AM

## 2011-09-14 NOTE — Progress Notes (Signed)
Patient seen, examined and discussed in detailed with PA. Will continue Reglan; EGD in AM. Continue antibiotics for UTI for one more day and follow results of renal US for her flank pain.  Vassie Loll [phone]

## 2011-09-14 NOTE — Progress Notes (Addendum)
Physical Therapy Evaluation Patient Details Name: [name] MRN: [id number] DOB: 06/16/1951 Today's Date: 09/14/2011  Problem List:  Patient Active Problem List  Diagnoses  . Hyperlipidemia  . GERD (gastroesophageal reflux disease)  . Hypertension  . Diabetes mellitus  . Hypokalemia  . Tachycardia  . Biliary sludge  . Anastomotic leak of biliary tree  . Nausea and vomiting  . Hypercalcemia  . UTI (lower urinary tract infection)    Past Medical History:  Past Medical History  Diagnosis Date  . Hydronephrosis   . Hyperlipidemia   . GERD (gastroesophageal reflux disease)   . Diabetes mellitus   . Hypertension   . Depression   . Headache   . Arthritis     fingers   Past Surgical History:  Past Surgical History  Procedure Date  . Ureteroscopy  4/12, 2/12, 3/10    X 3 SURGERIES  FOR KIDNEY STONES  . Cystoscopy     X 3  . Laparoscopic assisted vaginal hysterectomy 07/13/2011    Procedure: LAPAROSCOPIC ASSISTED VAGINAL HYSTERECTOMY;  Surgeon: Melony Overly;  Location: [facility];  Service: Gynecology;  Laterality: N/A;  . Salpingoophorectomy 07/13/2011    Procedure: SALPINGO OOPHERECTOMY;  Surgeon: Melony Overly;  Location: [facility];  Service: Gynecology;  Laterality: Bilateral;  . Anterior and posterior repair 07/13/2011    Procedure: ANTERIOR (CYSTOCELE) AND POSTERIOR REPAIR (RECTOCELE);  Surgeon: Melony Overly;  Location: [facility];  Service: Gynecology;  Laterality: N/A;  . Bladder suspension 07/13/2011    Procedure: TRANSVAGINAL TAPE (TVT) PROCEDURE;  Surgeon: Melony Overly;  Location: [facility];  Service: Gynecology;  Laterality: N/A;  . Cystoscopy 07/13/2011    Procedure: CYSTOSCOPY;  Surgeon: Melony Overly;  Location: [facility];  Service: Gynecology;  Laterality: N/A;  . Cholecystectomy   . Ercp 08/22/2011    Procedure: ENDOSCOPIC RETROGRADE CHOLANGIOPANCREATOGRAPHY (ERCP);  Surgeon: Barrie Folk, MD;  Location: [facility] ENDOSCOPY;  Service: Endoscopy;  Laterality: N/A;  . Biliary  stent placement 08/22/2011    Procedure: BILIARY STENT PLACEMENT;  Surgeon: Barrie Folk, MD;  Location: [facility] ENDOSCOPY;  Service: Endoscopy;  Laterality: N/A;  81f x 5cm stent for bile leak. placed in CBD    PT Assessment/Plan/Recommendation PT Assessment Clinical Impression Statement: Patient with vestibular symptoms therefore MD ordered PT.  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  Initiated exercises with patient.  PAtient to practice overnight and PT will progress over next several visits.  MD:  Charlyne Mom  will need Outpt. PT for vestibular rehab.  Patient will need prescription for this.  Thanks. PT Recommendation/Assessment: Patient will need skilled PT in the acute care venue PT Problem List: Other (comment) (vestibular hypofunction) PT Therapy Diagnosis :  (vertigo) PT Plan PT Frequency: Min 3X/week PT Treatment/Interventions:  (vestibular rehab) PT Recommendation Follow Up Recommendations: Outpatient PT (for vestibular rehab) Equipment Recommended: None recommended by PT PT Goals  Acute Rehab PT Goals PT Goal Formulation: With patient Time For Goal Achievement: 2 weeks PT Goal: Ambulate - Progress: Discontinued (comment) (old goal that was set previous admit) PT Goal: Up/Down Stairs - Progress: Discontinued (comment) Additional Goals PT Goal: Additional Goal #1 - Progress: Discontinued (comment) Additional Goal #2: Patient to perform standing x1 exercises with dizziness <3/10.   PT Goal: Additional Goal #2 - Progress: Goal set today Additional Goal #3: Patient to perfom saccade exercises with increased control of eye movements. PT Goal: Additional Goal #3 - Progress: Goal set today  PT Evaluation Precautions/Restrictions  Precautions Precautions: Fall Required Braces or Orthoses: No Restrictions Weight Bearing Restrictions: No Prior Functioning  Home Living Lives With: Spouse Receives Help From: Family Type of Home:  House Home Layout: One level Home Access: Stairs to enter Entrance Stairs-Rails: None Entrance Stairs-Number of Steps: 2 Bathroom Shower/Tub: Psychologist, counselling;Door [employer] Toilet: Handicapped height Bathroom Accessibility: Yes How Accessible: Accessible via wheelchair;Accessible via walker Home Adaptive Equipment: Built-in shower seat Prior Function Level of Independence: Independent with basic ADLs;Independent with homemaking with ambulation;Independent with gait;Needs assistance with tranfers Able to Take Stairs?: Yes Driving: Yes Vocation: Full time employment Cognition Cognition Arousal/Alertness: Awake/alert Overall Cognitive Status: Appears within functional limits for tasks assessed Orientation Level: Oriented X4 Sensation/Coordination Sensation Stereognosis: Not tested Hot/Cold: Not tested Proprioception: Not tested Coordination Gross Motor Movements are Fluid and Coordinated: Yes Fine Motor Movements are Fluid and Coordinated: Yes Extremity Assessment RUE Assessment RUE Assessment: Within Functional Limits LUE Assessment LUE Assessment: Within Functional Limits RLE Assessment RLE Assessment: Within Functional Limits LLE Assessment LLE Assessment: Within Functional Limits Mobility (including Balance) Bed Mobility Supine to Sit: 7: Independent Transfers Sit to Stand: 7: Independent Stand to Sit: 7: Independent Stand Pivot Transfers: Not tested (comment) Ambulation/Gait Ambulation/Gait Assistance: Not tested (comment) Stairs: No Wheelchair Mobility Wheelchair Mobility: No  Posture/Postural Control Posture/Postural Control: No significant limitations Static Sitting Balance Static Sitting - Balance Support: No upper extremity supported;Feet supported Static Sitting - Level of Assistance: 7: Independent Static Sitting - Comment/# of Minutes: 2 minutes while performing functional task with no LOB.  Assessed patient in sitting for vestibular symptoms.  Patient  with + head thrust to right with gaze stability impairment secondary to hypofunction on the right.  Patient with saccades also impaired.  Initiated saccade exercises and x1 exercises.  Patient performed x1 exercises for 22 seconds with 4/10 dizziness and then 34 seconds with 4/10 dizziness.  Patient aware of how much to perform exercise.   Exercise    End of Session PT - End of Session Equipment Utilized During Treatment: Gait belt Activity Tolerance: Patient tolerated treatment well Patient left: in bed;with call bell in reach;with family/visitor present Nurse Communication: Mobility status for transfers General Behavior During Session: [facility] for tasks performed Cognition: [facility] for tasks performed  INGOLD,Charlayne Vultaggio 09/14/2011, 3:21 PM  [facility] [phone] (pager)

## 2011-09-15 ENCOUNTER — Encounter (HOSPITAL_COMMUNITY)
Admission: EM | Disposition: A | Discharge status: Home / Self Care | Payer: Self-pay | Source: Home / Self Care | Attending: Internal Medicine

## 2011-09-15 ENCOUNTER — Encounter (HOSPITAL_COMMUNITY): Payer: Self-pay | Admitting: *Deleted

## 2011-09-15 ENCOUNTER — Inpatient Hospital Stay (HOSPITAL_COMMUNITY): Payer: BC Managed Care – PPO

## 2011-09-15 ENCOUNTER — Other Ambulatory Visit (HOSPITAL_COMMUNITY): Payer: BC Managed Care – PPO

## 2011-09-15 ENCOUNTER — Other Ambulatory Visit: Payer: Self-pay | Admitting: Gastroenterology

## 2011-09-15 LAB — GLUCOSE, CAPILLARY
Glucose-Capillary: 95 mg/dL (ref 70–99)
Glucose-Capillary: 87 mg/dL (ref 70–99)

## 2011-09-15 LAB — BASIC METABOLIC PANEL
Chloride: 101 mEq/L (ref 96–112)
Creatinine, Ser: 0.63 mg/dL (ref 0.50–1.10)
GFR calc Af Amer: >90 mL/min (ref >90)
Potassium: 3.8 mEq/L (ref 3.5–5.1)
Sodium: 138 mEq/L (ref 135–145)

## 2011-09-15 SURGERY — EGD (ESOPHAGOGASTRODUODENOSCOPY)
Anesthesia: Moderate Sedation

## 2011-09-15 MED ORDER — MIDAZOLAM HCL 10 MG/2ML IJ SOLN
INTRAMUSCULAR | Status: AC
  Filled 2011-09-15: qty 2

## 2011-09-15 MED ORDER — MIDAZOLAM HCL 10 MG/2ML IJ SOLN
INTRAMUSCULAR | Status: DC | PRN
  Administered 2011-09-15: 2 mg via INTRAVENOUS
  Administered 2011-09-15 (×4): 1 mg via INTRAVENOUS

## 2011-09-15 MED ORDER — BUTAMBEN-TETRACAINE-BENZOCAINE 2-2-14 % EX AERO
INHALATION_SPRAY | CUTANEOUS | Status: DC | PRN
  Administered 2011-09-15: 2 via TOPICAL

## 2011-09-15 MED ORDER — METOCLOPRAMIDE HCL 10 MG PO TABS
10.0000 mg | ORAL_TABLET | Freq: Three times a day (TID) | ORAL | Status: DC
  Administered 2011-09-15 – 2011-09-16 (×3): 10 mg via ORAL
  Filled 2011-09-15 (×5): qty 1

## 2011-09-15 MED ORDER — FENTANYL CITRATE 0.05 MG/ML IJ SOLN
INTRAMUSCULAR | Status: AC
  Filled 2011-09-15: qty 2

## 2011-09-15 MED ORDER — FENTANYL NICU IV SYRINGE 50 MCG/ML
INJECTION | INTRAMUSCULAR | Status: DC | PRN
  Administered 2011-09-15 (×2): 25 ug via INTRAVENOUS

## 2011-09-15 NOTE — Brief Op Note (Signed)
09/11/2011 - 09/15/2011  12:30 PM  PATIENT:  [name]  61 y.o. female  PRE-OPERATIVE DIAGNOSIS:  Nausea and vomiting  POST-OPERATIVE DIAGNOSIS:  hiatal hernia otherwise normal EGD, r/o h pylori  PROCEDURE:  Procedure(s): ESOPHAGOGASTRODUODENOSCOPY (EGD)  SURGEON:  Surgeon(s): Barrie Folk, MD  Please see formal report. Small hiatal hernia otherwise normal study with previously placed biliary stent in good position with good drainage of clear amber bile.

## 2011-09-15 NOTE — Progress Notes (Signed)
Subjective: Essentially normal EGD; Patient still with n/v episodes (intermittently); overall better according to her.  Objective: Vital signs in last 24 hours: Temp:  [97.8 F (36.6 C)-98.2 F (36.8 C)] 98 F (36.7 C) (01/18 1346) Pulse Rate:  [74-83] 75  (01/18 1346) Resp:  [13-22] 20  (01/18 1346) BP: (102-186)/(62-120) 111/76 mmHg (01/18 1346) SpO2:  [95 %-100 %] 95 % (01/18 1346) Weight change:  Last BM Date: 09/14/11  Intake/Output from previous day: 01/17 0701 - 01/18 0700 In: 2281.5 [P.O.:820; I.V.:1401.5; IV Piggyback:60] Out: -      Physical Exam: General: Alert, awake, oriented x3, in no acute distress. HEENT: No bruits, no goiter. Heart: Regular rate and rhythm, without murmurs, rubs, gallops. Lungs: Clear to auscultation bilaterally. Abdomen: Soft, mild left flank tenderness with deep palpation, nondistended, positive bowel sounds. Extremities: No clubbing cyanosis or edema with positive pedal pulses. Neuro: Grossly intact, nonfocal.  Lab Results: Basic Metabolic Panel:  Basename 09/15/11 0506 09/14/11 0630  NA 138 138  K 3.8 4.0  CL 101 103  CO2 21 22  GLUCOSE 86 101*  BUN 5* 6  CREATININE 0.63 0.73  CALCIUM 9.9 9.4  MG -- --  PHOS -- --    Basename 09/13/11 0515  LIPASE 29  AMYLASE --   CBG:  Basename 09/15/11 1116 09/15/11 0819 09/14/11 2215 09/14/11 1625 09/14/11 1218 09/14/11 0810  GLUCAP 95 95 81 77 95 94    Misc. Labs:  Recent Results (from the past 240 hour(s))  URINE CULTURE     Status: Normal   Collection Time   09/12/11 12:49 AM      Component Value Range Status Comment   Specimen Description URINE, CLEAN CATCH   Final    Special Requests NONE   Final    Setup Time [id number]   Final    Colony Count 25,000 COLONIES/ML   Final    Culture     Final    Value: Multiple bacterial morphotypes present, none predominant. Suggest appropriate recollection if clinically indicated.   Report Status 09/13/2011 FINAL   Final   URINE  CULTURE     Status: Normal   Collection Time   09/12/11  3:25 PM      Component Value Range Status Comment   Specimen Description URINE, RANDOM   Final    Special Requests NONE   Final    Setup Time [id number]   Final    Colony Count NO GROWTH   Final    Culture NO GROWTH   Final    Report Status 09/13/2011 FINAL   Final     Studies/Results: US Renal  09/15/2011  *RADIOLOGY REPORT*  Clinical Data: Left flank pain  RENAL/URINARY TRACT ULTRASOUND COMPLETE  Comparison:  CT abdomen pelvis dated 09/12/2011  Findings:  Right Kidney:  Measures 10.5 cm.  Stable mild fullness of the renal pelvis.  No mass or frank hydronephrosis.  Left Kidney:  Measures 11.4 cm.  5 mm nonobstructing calculus in the interpolar region.  7 mm nonobstructing calculus in the lower pole.  No hydronephrosis.  Bladder:  Within normal limits.  IMPRESSION: Two nonobstructing left renal calculi measuring up to 7 mm.  No hydronephrosis.  Stable mild fullness of the right renal pelvis.  Original Report Authenticated By: Charline Bills, M.D.    Medications: Scheduled Meds:    . cefTRIAXone (ROCEPHIN)  IV  1 g Intravenous Q24H  . insulin aspart  0-9 Units Subcutaneous TID WC  . metoCLOPramide  10 mg Oral  TID AC & HS  . pantoprazole (PROTONIX) IV  40 mg Intravenous Q12H  . DISCONTD: metoCLOPramide (REGLAN) injection  10 mg Intravenous TID AC & HS   Continuous Infusions:   . 0.45 % NaCl with KCl 20 mEq / L 20 mL/hr at 09/14/11 1459   PRN Meds:.acetaminophen, acetaminophen, ondansetron (ZOFRAN) IV, ondansetron, promethazine, DISCONTD: butamben-tetracaine-benzocaine, DISCONTD: fentaNYL, DISCONTD: midazolam  Assessment/Plan: 1-Nausea and vomiting: presumed to be 2/2 gastroparesis. Will continue tx with reglan QID. Will change to PO today in anticipation to home discharge.  2-Diabetes mellitus: well controlled w/o medications after she has lost weight. No meds needed at this point.   3-Hypercalcemia: resolved. Secondary  to dehydration  4-UTI (lower urinary tract infection): Continue rocephin for 1 more day. No dysuria. Renal US no showing pyelonephritis or obstructions.    LOS: 4 days   Zohaib Heeney [facility] [phone]  09/15/2011, 6:22 PM

## 2011-09-15 NOTE — Progress Notes (Signed)
Physical Therapy Treatment Patient Details Name: [name] MRN: [id number] DOB: 01-Jul-1951 Today's Date: 09/15/2011  PT Assessment/Plan  PT - Assessment/Plan Comments on Treatment Session: Patient reports feeling a little better today and not as off balance as before.  Reviewed all exercises and added some standing exercises.  Patietn reports she may leave tomorrow.  MD:  Please give pt. prescription for Outpt. PT for vestibular rehab. PT Plan: Discharge plan remains appropriate;Frequency remains appropriate PT Frequency: Min 3X/week Follow Up Recommendations: Outpatient PT;Supervision - Intermittent Equipment Recommended: None recommended by PT PT Goals  Acute Rehab PT Goals PT Goal Formulation: With patient Additional Goals PT Goal: Additional Goal #2 - Progress: Progressing toward goal PT Goal: Additional Goal #3 - Progress: Progressing toward goal  PT Treatment Precautions/Restrictions  Precautions Precautions: Fall Required Braces or Orthoses: No Restrictions Weight Bearing Restrictions: No Mobility (including Balance) Bed Mobility Supine to Sit: 7: Independent Transfers Sit to Stand: 7: Independent Stand to Sit: 7: Independent Stand Pivot Transfers: 7: Independent Ambulation/Gait Ambulation/Gait Assistance: 5: Supervision Ambulation/Gait Assistance Details (indicate cue type and reason): no LOB; minor disruption in path with head nods.   Ambulation Distance (Feet): 350 Feet Assistive device: None Gait Pattern: Within Functional Limits Stairs: No Wheelchair Mobility Wheelchair Mobility: No  Posture/Postural Control Posture/Postural Control: No significant limitations Static Sitting Balance Static Sitting - Level of Assistance: Not tested (comment) High Level Balance High Level Balance Comments: Patient able to bend to floor and pick up object with supervision only.  Patient added x1 exercises in standing with feet apart, feet in tandem and walking.  Husband  aware to guard patient.  Reviewed saccade exercises as well.  Gave handouts for new exercises.   Exercise    End of Session PT - End of Session Equipment Utilized During Treatment: Gait belt Activity Tolerance: Patient tolerated treatment well Patient left: in bed;with call bell in reach;with family/visitor present Nurse Communication: Mobility status for transfers General Behavior During Session: [facility] for tasks performed Cognition: [facility] for tasks performed  INGOLD,Tim Corriher 09/15/2011, 1:54 PM

## 2011-09-15 NOTE — Op Note (Signed)
[facility] [address] [place], [place]  [address]  ENDOSCOPY PROCEDURE REPORT  PATIENT:  [name], [name]  MR#:  [id number] BIRTHDATE:  1951-02-27, 60 yrs. old  GENDER:  female  ENDOSCOPIST:  Dorena Cookey Referred by:  PROCEDURE DATE:  09/15/2011 PROCEDURE: ASA CLASS: INDICATIONS:   refractory nausea and vomiting  MEDICATIONS: Fentanyl 50 mcg, Versed 6 mg TOPICAL ANESTHETIC: Cetacaine  DESCRIPTION OF PROCEDURE:   After the risks benefits and alternatives of the procedure were thoroughly explained, informed consent was obtained.  The Pentax Gastroscope [id number] endoscope was introduced through the mouth and advanced to the , without limitations.  The instrument was slowly withdrawn as the mucosa was fully examined. <<PROCEDUREIMAGES>>  FINDINGS:  Small hiatal hernia otherwise normal study. Previously placed biliary stent in good position. Biopsies taken to assess for H. pylori.  COMPLICATIONS: None  ENDOSCOPIC IMPRESSION:  Small hiatal hernia, no apparent cause of her nausea and vomiting, no retained food in the stomach.  RECOMMENDATIONS: Await H. pylori antibody, continue to consider nuclear medicine gastric emptying study.  REPEAT EXAM:  ______________________________ Dorena Cookey  CC:  n. eSIGNED:   Dorena Cookey at 09/15/2011 12:30 PM  Elder Negus, [phone]

## 2011-09-15 NOTE — Progress Notes (Signed)
[facility] Progress Note  Subjective: Still complaining of nausea and vomiting  Objective: Vital signs in last 24 hours: Temp:  [97.8 F (36.6 C)-98.2 F (36.8 C)] 97.8 F (36.6 C) (01/18 0530) Pulse Rate:  [74-83] 74  (01/18 0530) Resp:  [13-20] 16  (01/18 1225) BP: (102-186)/(62-120) 186/120 mmHg (01/18 1225) SpO2:  [98 %-100 %] 100 % (01/18 1225) Weight change:    PE: Unchanged  Lab Results: Results for orders placed during the hospital encounter of 09/11/11 (from the past 24 hour(s))  GLUCOSE, CAPILLARY     Status: Normal   Collection Time   09/14/11  4:25 PM      Component Value Range   Glucose-Capillary 77  70 - 99 (mg/dL)  GLUCOSE, CAPILLARY     Status: Normal   Collection Time   09/14/11 10:15 PM      Component Value Range   Glucose-Capillary 81  70 - 99 (mg/dL)  BASIC METABOLIC PANEL     Status: Abnormal   Collection Time   09/15/11  5:06 AM      Component Value Range   Sodium 138  135 - 145 (mEq/L)   Potassium 3.8  3.5 - 5.1 (mEq/L)   Chloride 101  96 - 112 (mEq/L)   CO2 21  19 - 32 (mEq/L)   Glucose, Bld 86  70 - 99 (mg/dL)   BUN 5 (*) 6 - 23 (mg/dL)   Creatinine, Ser 1.19  0.50 - 1.10 (mg/dL)   Calcium 9.9  8.4 - 14.7 (mg/dL)   GFR calc non Af Amer >90  >90 (mL/min)   GFR calc Af Amer >90  >90 (mL/min)  GLUCOSE, CAPILLARY     Status: Normal   Collection Time   09/15/11  8:19 AM      Component Value Range   Glucose-Capillary 95  70 - 99 (mg/dL)   Comment 1 Notify RN     Comment 2 Documented in Chart      Studies/Results: EGD showed small hiatal hernia, previously placed biliary stent in good position draining clear amber bile, no apparent cause of her continued nausea and vomiting   Assessment: Nausea and vomiting of unclear origin  Plan: Difficult situation. Will try to persuade her to try the nuclear medicine gastric imaging study again. Also consider possible supratentorial causes at this point.    Legaci Tarman C 09/15/2011, 12:35  PM

## 2011-09-16 LAB — BASIC METABOLIC PANEL
BUN: 5 mg/dL — ABNORMAL LOW (ref 6–23)
GFR calc non Af Amer: >90 mL/min (ref >90)
Glucose, Bld: 91 mg/dL (ref 70–99)
Potassium: 4.5 mEq/L (ref 3.5–5.1)

## 2011-09-16 MED ORDER — ESOMEPRAZOLE MAGNESIUM 40 MG PO CPDR: 40.0000 mg | DELAYED_RELEASE_CAPSULE | Freq: Two times a day (BID) | ORAL | Status: DC

## 2011-09-16 MED ORDER — PROMETHAZINE HCL 12.5 MG PO TABS: 12.5000 mg | ORAL_TABLET | Freq: Four times a day (QID) | ORAL | Status: AC | PRN

## 2011-09-16 MED ORDER — PANTOPRAZOLE SODIUM 40 MG PO TBEC: 40.0000 mg | DELAYED_RELEASE_TABLET | Freq: Two times a day (BID) | ORAL | Status: DC

## 2011-09-16 MED ORDER — METOCLOPRAMIDE HCL 10 MG PO TABS: 10.0000 mg | ORAL_TABLET | Freq: Three times a day (TID) | ORAL | Status: AC

## 2011-09-16 NOTE — Progress Notes (Signed)
Pt given AVS discharge directions, follow up appointments and new prescriptions. Gave pt information on gastroparesis and all questions answered. D/c IV, unremarkable. Skin intact. Left accompanied by husband via wheelchair for home. Peter Congo RN

## 2011-09-16 NOTE — Discharge Summary (Signed)
Physician Discharge Summary  Patient ID: [name] MRN: [id number] DOB/AGE: 1950/12/23 61 y.o.  Admit date: 09/11/2011 Discharge date: 09/16/2011  Primary Care Physician:  Criss Rosales, MD, MD   Discharge Diagnoses:   1-intractable nausea/vomiting (presumed to be secondary to gastroparesis) 2-diabetes mellitus 3-hypercalcemia 4-UTI  5-hyperlipidemia 6-nephrolithiasis 7-depression 8-Gastroesophageal reflux disease   Current Discharge Medication List    START taking these medications   Details  metoCLOPramide (REGLAN) 10 MG tablet Take 1 tablet (10 mg total) by mouth 4 (four) times daily -  before meals and at bedtime. Qty: 120 tablet, Refills: 1    promethazine (PHENERGAN) 12.5 MG tablet Take 1 tablet (12.5 mg total) by mouth every 6 (six) hours as needed for nausea. Qty: 30 tablet, Refills: 0      CONTINUE these medications which have CHANGED   Details  esomeprazole (NEXIUM) 40 MG capsule Take 1 capsule (40 mg total) by mouth 2 (two) times daily. Qty: 60 capsule, Refills: 1      CONTINUE these medications which have NOT CHANGED   Details  atorvastatin (LIPITOR) 20 MG tablet Take 1 tablet (20 mg total) by mouth at bedtime.    estrogens conjugated, synthetic A, (CENESTIN) 0.9 MG tablet Take 0.9 mg by mouth daily.      metoprolol (LOPRESSOR) 50 MG tablet Take 50 mg by mouth daily.     venlafaxine (EFFEXOR-XR) 150 MG 24 hr capsule Take 150 mg by mouth every morning.    vitamin B-12 (CYANOCOBALAMIN) 1000 MCG tablet Take 1,000 mcg by mouth daily.      vitamin C (ASCORBIC ACID) 500 MG tablet Take 1,000 mg by mouth daily.        STOP taking these medications     metFORMIN (GLUCOPHAGE-XR) 500 MG 24 hr tablet      sucralfate (CARAFATE) 1 GM/10ML suspension      calcium carbonate (OS-CAL) 600 MG TABS      venlafaxine (EFFEXOR) 100 MG tablet          Disposition and Follow-up:  Patient discharged in a stable and improved condition quarterly without any  active vomiting or dehydration status. Patient is a still with some episode of nausea and spitting up saliva. At this moment she is on arrange followup with PCP over the next 7-10 days to continue evaluating her symptoms. She had been started on Reglan 4 times a day for presumably gastroparesis. The rest of her workup during this hospitalization has been negative. Patient has been instructed to keep herself hydrated and to advance her diet slowly as tolerated. Patient metformin was placed on the whole seems at this moment patient intake and weight loss has has made her CBGs to be below 100 throughout the whole hospitalization. The followup with PCP decision to restart this medication will be decided.  Consults:   GI  Significant Diagnostic Studies:  Ct Abdomen Pelvis W Contrast  09/12/2011  *RADIOLOGY REPORT*  Clinical Data: Vomiting; unable to eat or drink for 4 days.  Weight loss.  CT ABDOMEN AND PELVIS WITH CONTRAST  Technique:  Multidetector CT imaging of the abdomen and pelvis was performed following the standard protocol during bolus administration of intravenous contrast.  Contrast: 80mL OMNIPAQUE IOHEXOL 300 MG/ML IV SOLN  Comparison: Abdominal radiograph performed 08/23/2011, and CT of the abdomen and pelvis performed 02/20/2011  Findings: The visualized lung bases are clear.  The patient appears status post recent cholecystectomy, with residual soft tissue stranding noted at the gallbladder fossa, and associated clips.  A  stent is noted within the common hepatic and common bile duct, seen in expected position.  There is no evidence of intrahepatic biliary ductal dilatation.  The liver is unremarkable in appearance.  There is no evidence of significant bile leak.  No free fluid is noted about the liver.  A calcification within the spleen is likely vascular in nature. Scattered tiny hypodensities within the spleen are nonspecific but may reflect cysts.  The pancreas is unremarkable in appearance. The  adrenal glands are within normal limits.  Residual pelvicaliectasis is noted at the right kidney; this is markedly improved from the chronic severe hydronephrosis noted on the prior CT.  There is mild right renal atrophy.  A few small nonobstructing left renal stones are seen, measuring up to 3 mm in size.  A 7 mm cyst is noted at the interpole region of the right kidney.  No perinephric stranding is seen.  No free fluid is identified.  The small bowel is unremarkable in appearance.  The stomach is within normal limits.  No acute vascular abnormalities are seen.  Scattered calcification is noted along the distal abdominal aorta and its branches.  Soft tissue inflammation at the umbilicus likely reflects recent trocar placement for cholecystectomy.  The appendix is normal in caliber, without evidence for appendicitis.  The colon is largely decompressed.  Minimal diverticulosis is noted along the descending and proximal sigmoid colon, without evidence of diverticulitis.  The bladder is decompressed and not well assessed.  The patient is status post hysterectomy; no suspicious adnexal masses are seen. No inguinal lymphadenopathy is seen.  No acute osseous abnormalities are identified.  Disc space narrowing and vacuum phenomenon are noted at L5-S1, with associated endplate sclerotic change.  IMPRESSION:  1.  No acute abnormalities seen within the abdomen or pelvis. 2.  Findings of recent cholecystectomy, with mild residual soft tissue inflammation at the gallbladder fossa; no evidence of bile leak.  Associated soft tissue inflammation at the umbilicus likely reflects recent trocar placement. 3.  Stent noted within the common hepatic and common bile duct, in expected position.  No evidence for obstruction. 4.  Residual pelvicaliectasis at the right kidney is markedly improved from chronic severe hydronephrosis noted on the prior CT; mild right renal atrophy noted. 5.  Few small nonobstructing left renal stones, measuring  up to 7 mm in size; small right renal cyst seen. 6.  Minimal diverticulosis along the descending and proximal sigmoid colon, without evidence of diverticulitis. 7.  Question of tiny splenic cysts.  Original Report Authenticated By: Tonia Ghent, M.D.   EGD: Essentially normal. Procedure performed by Dr. Madilyn Fireman; please refer to his procedure note for further details. (Done on 09/15/2011)  Brief H and P: 61 year old female with a past medical history significant for diabetes, nephrolithiasis, recent cholecystectomy with complication of biliary leak (status post biliary stent), hyperlipidemia and depression; who came to the hospital secondary to abdominal pain and intractable nausea and vomiting. In the major department patient was found dehydrated, with hypercalcemia, and also with hypokalemia. Other laboratory workup found on care IUD analysis suggesting UTI. Patient was admitted to the hospital for further evaluation and treatment.  Hospital Course:  1-intractable nausea/vomiting (presumed to be secondary to gastroparesis): Patient started on Reglan 4 times a day and her PPIs increase to twice a day. She has been instructed to about her diet slowly as tolerated and try to eat multiple small meals throughout the day. She will arrange followup with PCP in order to continue evaluating her  symptoms and decide further workup. Patient will also follow with Dr. Madilyn Fireman for the next 3-4 weeks in order to have her biliary stent removed.  2-diabetes mellitus: After weight loss and decreasing take CBGs below 100 without medications throughout the whole hospitalization. At this moment we have placed her metformin on hold him to followup with PCP.  3-hypercalcemia: Do to dehydration. Resolve after fluid resuscitation.  4-UTI: Patient complete IB therapy throughout this hospitalization for UTI. Micronase and was never isolated. (Culture was taken after the patient received antibiotics).  5-hyperlipidemia: Continue  statins.  6-nephrolithiasis: Renal ultrasound was performed during this hospitalization demonstrated small nonobstructing is stones on her left kidney. She will arrange followup with urologist as an outpatient for further decision-making on treatment for her nephrolithiasis.  7-depression: Continue Effexor  8-Gastroesophageal reflux disease: Continue PPI.  Time spent on Discharge: 40 minutes  Signed: Joelee Snoke 09/16/2011, 1:13 PM

## 2011-12-12 SURGERY — Surgical Case
Anesthesia: *Unknown

## 2011-12-15 ENCOUNTER — Ambulatory Visit (HOSPITAL_COMMUNITY): Payer: BC Managed Care – PPO

## 2011-12-15 ENCOUNTER — Encounter (HOSPITAL_COMMUNITY)
Admission: RE | Disposition: A | Discharge status: Home / Self Care | Payer: Self-pay | Source: Ambulatory Visit | Attending: Gastroenterology

## 2011-12-15 ENCOUNTER — Encounter (HOSPITAL_COMMUNITY): Payer: Self-pay

## 2011-12-15 ENCOUNTER — Ambulatory Visit (HOSPITAL_COMMUNITY)
Admission: RE | Admit: 2011-12-15 | Discharge: 2011-12-15 | Disposition: A | Discharge status: Home / Self Care | Payer: BC Managed Care – PPO | Source: Ambulatory Visit | Attending: Gastroenterology | Admitting: Gastroenterology

## 2011-12-15 DIAGNOSIS — Z79899 Other long term (current) drug therapy: Secondary | ICD-10-CM | POA: Insufficient documentation

## 2011-12-15 DIAGNOSIS — Z4689 Encounter for fitting and adjustment of other specified devices: Secondary | ICD-10-CM | POA: Insufficient documentation

## 2011-12-15 DIAGNOSIS — Z7982 Long term (current) use of aspirin: Secondary | ICD-10-CM | POA: Insufficient documentation

## 2011-12-15 DIAGNOSIS — E119 Type 2 diabetes mellitus without complications: Secondary | ICD-10-CM | POA: Insufficient documentation

## 2011-12-15 DIAGNOSIS — K838 Other specified diseases of biliary tract: Secondary | ICD-10-CM

## 2011-12-15 DIAGNOSIS — E785 Hyperlipidemia, unspecified: Secondary | ICD-10-CM | POA: Insufficient documentation

## 2011-12-15 DIAGNOSIS — K219 Gastro-esophageal reflux disease without esophagitis: Secondary | ICD-10-CM | POA: Insufficient documentation

## 2011-12-15 DIAGNOSIS — I10 Essential (primary) hypertension: Secondary | ICD-10-CM | POA: Insufficient documentation

## 2011-12-15 DIAGNOSIS — Z9089 Acquired absence of other organs: Secondary | ICD-10-CM | POA: Insufficient documentation

## 2011-12-15 HISTORY — PX: ERCP: SHX5425

## 2011-12-15 SURGERY — ERCP, WITH INTERVENTION IF INDICATED
Anesthesia: Moderate Sedation

## 2011-12-15 MED ORDER — BUTAMBEN-TETRACAINE-BENZOCAINE 2-2-14 % EX AERO
INHALATION_SPRAY | CUTANEOUS | Status: DC | PRN
  Administered 2011-12-15: 2 via TOPICAL

## 2011-12-15 MED ORDER — SODIUM CHLORIDE 0.9 % IV SOLN
INTRAVENOUS | Status: AC
  Filled 2011-12-15: qty 1.5

## 2011-12-15 MED ORDER — GLUCAGON HCL (RDNA) 1 MG IJ SOLR
INTRAMUSCULAR | Status: AC
  Filled 2011-12-15: qty 2

## 2011-12-15 MED ORDER — FENTANYL CITRATE 0.05 MG/ML IJ SOLN
INTRAMUSCULAR | Status: AC
  Filled 2011-12-15: qty 4

## 2011-12-15 MED ORDER — MIDAZOLAM HCL 10 MG/2ML IJ SOLN
INTRAMUSCULAR | Status: AC
  Filled 2011-12-15: qty 4

## 2011-12-15 MED ORDER — DIPHENHYDRAMINE HCL 50 MG/ML IJ SOLN
INTRAMUSCULAR | Status: AC
  Filled 2011-12-15: qty 1

## 2011-12-15 MED ORDER — SODIUM CHLORIDE 0.9 % IV SOLN
1.5000 g | INTRAVENOUS | Status: DC
  Filled 2011-12-15: qty 1.5

## 2011-12-15 MED ORDER — SODIUM CHLORIDE 0.9 % IV SOLN
Freq: Once | INTRAVENOUS | Status: AC
  Administered 2011-12-15: 500 mL via INTRAVENOUS

## 2011-12-15 MED ORDER — SODIUM CHLORIDE 0.9 % IV SOLN
INTRAVENOUS | Status: DC | PRN
  Administered 2011-12-15: 10:00:00

## 2011-12-15 MED ORDER — FENTANYL CITRATE 0.05 MG/ML IJ SOLN
INTRAMUSCULAR | Status: DC | PRN
  Administered 2011-12-15 (×4): 25 ug via INTRAVENOUS

## 2011-12-15 MED ORDER — MIDAZOLAM HCL 10 MG/2ML IJ SOLN
INTRAMUSCULAR | Status: DC | PRN
  Administered 2011-12-15 (×4): 2 mg via INTRAVENOUS

## 2011-12-15 NOTE — Op Note (Signed)
[facility] [address] [place], [place]  [address]  ERCP PROCEDURE REPORT  PATIENT:  [name], [name]  MR#:  [id number] BIRTHDATE:  18-Apr-1951  GENDER:  female  ENDOSCOPIST:  Dorena Cookey ASSISTANT:  Bary Leriche, Felecia Shelling, RN  PROCEDURE DATE:  12/15/2011 PROCEDURE:  ERCP with stent removal ASA CLASS:  INDICATIONS:   removal of previously placed biliary stent for bile leak after cholecystectomy  MEDICATIONS:  Fentanyl 100 mcg, Versed 8 mg TOPICAL ANESTHETIC:  DESCRIPTION OF PROCEDURE:   After the risks benefits and alternatives of the procedure were thoroughly explained, informed consent was obtained.  The Pentax ERCP [id number] endoscope was introduced through the mouth and advanced to the .  The examination was normal with no endoscopic findings. A stent was seen in place in the papilla. The sphincterotome was passed into the stent and a cholangiogram obtained which showed no extravasation or other abnormality. The stent was then removed with the snare together with the scope. <<PROCEDUREIMAGES>>  COMPLICATIONS:  None  ENDOSCOPIC IMPRESSION: Successful removal of biliary stent with no cholangiographic evidence of persistent bile leak  RECOMMENDATIONS: Expectant management  ______________________________ Dorena Cookey  CC:  n. eSIGNED:   Dorena Cookey at 12/15/2011 10:06 AM  Elder Negus, [phone]

## 2011-12-15 NOTE — H&P (Signed)
[facility] Admission History & Physical  Chief Complaint: Outpatient ERCP with stent removal, up-to-date H&P since March 15 HPI: [name] is an 61 y.o. white female.  Who presents for elective removal of previously placed biliary stent on December 25 for bile leak. She is currently asymptomatic and is recently had normal liver function tests.  Past Medical History  Diagnosis Date  . Hydronephrosis   . Hyperlipidemia   . GERD (gastroesophageal reflux disease)   . Diabetes mellitus   . Hypertension   . Depression   . Headache   . Arthritis     fingers    Past Surgical History  Procedure Date  . Ureteroscopy  4/12, 2/12, 3/10    X 3 SURGERIES  FOR KIDNEY STONES  . Cystoscopy     X 3  . Laparoscopic assisted vaginal hysterectomy 07/13/2011    Procedure: LAPAROSCOPIC ASSISTED VAGINAL HYSTERECTOMY;  Surgeon: Melony Overly;  Location: [facility];  Service: Gynecology;  Laterality: N/A;  . Salpingoophorectomy 07/13/2011    Procedure: SALPINGO OOPHERECTOMY;  Surgeon: Melony Overly;  Location: [facility];  Service: Gynecology;  Laterality: Bilateral;  . Anterior and posterior repair 07/13/2011    Procedure: ANTERIOR (CYSTOCELE) AND POSTERIOR REPAIR (RECTOCELE);  Surgeon: Melony Overly;  Location: [facility];  Service: Gynecology;  Laterality: N/A;  . Bladder suspension 07/13/2011    Procedure: TRANSVAGINAL TAPE (TVT) PROCEDURE;  Surgeon: Melony Overly;  Location: [facility];  Service: Gynecology;  Laterality: N/A;  . Cystoscopy 07/13/2011    Procedure: CYSTOSCOPY;  Surgeon: Melony Overly;  Location: [facility];  Service: Gynecology;  Laterality: N/A;  . Cholecystectomy   . Ercp 08/22/2011    Procedure: ENDOSCOPIC RETROGRADE CHOLANGIOPANCREATOGRAPHY (ERCP);  Surgeon: Barrie Folk, MD;  Location: [facility] ENDOSCOPY;  Service: Endoscopy;  Laterality: N/A;  . Biliary stent placement 08/22/2011    Procedure: BILIARY STENT PLACEMENT;  Surgeon: Barrie Folk, MD;  Location: [facility] ENDOSCOPY;  Service:  Endoscopy;  Laterality: N/A;  67f x 5cm stent for bile leak. placed in CBD    Medications Prior to Admission  Medication Dose Route Frequency Provider Last Rate Last Dose  . 0.9 %  sodium chloride infusion   Intravenous Once Barrie Folk, MD 20 mL/hr at 12/15/11 0750 500 mL at 12/15/11 0750  . ampicillin-sulbactam (UNASYN) 1.5 g in sodium chloride 0.9 % 50 mL IVPB  1.5 g Intravenous To Endo Barrie Folk, MD       Medications Prior to Admission  Medication Sig Dispense Refill  . aspirin 81 MG tablet Take 81 mg by mouth daily.      Marland Kitchen atorvastatin (LIPITOR) 20 MG tablet Take 1 tablet (20 mg total) by mouth at bedtime.      Marland Kitchen esomeprazole (NEXIUM) 40 MG capsule Take 1 capsule (40 mg total) by mouth 2 (two) times daily.  60 capsule  1  . estrogens conjugated, synthetic A, (CENESTIN) 0.9 MG tablet Take 0.9 mg by mouth daily.        . metFORMIN (GLUCOPHAGE) 500 MG tablet Take 500 mg by mouth 1 day or 1 dose.      . metoprolol (LOPRESSOR) 50 MG tablet Take 50 mg by mouth daily.       . mirtazapine (REMERON) 30 MG tablet Take 30 mg by mouth at bedtime.      . Multiple Vitamins-Minerals (MULTIVITAMIN WITH MINERALS) tablet Take 1 tablet by mouth daily.      Marland Kitchen venlafaxine (EFFEXOR-XR) 150 MG 24  hr capsule Take 150 mg by mouth every morning.      . vitamin B-12 (CYANOCOBALAMIN) 1000 MCG tablet Take 1,000 mcg by mouth daily.        . vitamin C (ASCORBIC ACID) 500 MG tablet Take 1,000 mg by mouth daily.          Allergies:  Allergies  Allergen Reactions  . Flomax (Tamsulosin Hcl) Swelling    Pt. Had swelling of jaw  . Sulfa Antibiotics     Family History  Problem Relation Age of Onset  . Anesthesia problems Neg Hx   . Hypotension Neg Hx   . Malignant hyperthermia Neg Hx   . Pseudochol deficiency Neg Hx     Social History:  reports that she has never smoked. She does not have any smokeless tobacco history on file. She reports that she does not drink alcohol or use illicit drugs.  Review of  Systems: negative except as above recent nausea improved   Blood pressure 139/87, temperature 98.5 F (36.9 C), temperature source Oral, resp. rate 15, height 5\' 4"  (1.626 m), weight 68.04 kg (150 lb), SpO2 100.00%. Head: Normocephalic, without obvious abnormality, atraumatic Neck: no adenopathy, no carotid bruit, no JVD, supple, symmetrical, trachea midline and thyroid not enlarged, symmetric, no tenderness/mass/nodules Resp: clear to auscultation bilaterally Cardio: regular rate and rhythm, S1, S2 normal, no murmur, click, rub or gallop GI: Abdomen soft nondistended Extremities: extremities normal, atraumatic, no cyanosis or edema  Results for orders placed during the hospital encounter of 12/15/11 (from the past 48 hour(s))  GLUCOSE, CAPILLARY     Status: Normal   Collection Time   12/15/11  7:55 AM      Component Value Range Comment   Glucose-Capillary 97  70 - 99 (mg/dL)    No results found.  Assessment: History of bile leak after cholecystectomy status post stent placement Plan: Will plan removal endoscopically.  Kaytlyn Din C 12/15/2011, 9:33 AM

## 2011-12-15 NOTE — Discharge Instructions (Signed)
Endoscopy Care After Please read the instructions outlined below and refer to this sheet in the next few weeks. These discharge instructions provide you with general information on caring for yourself after you leave the hospital. Your doctor may also give you specific instructions. While your treatment has been planned according to the most current medical practices available, unavoidable complications occasionally occur. If you have any problems or questions after discharge, please call your doctor. HOME CARE INSTRUCTIONS Activity  You may resume your regular activity but move at a slower pace for the next 24 hours.   Take frequent rest periods for the next 24 hours.   Walking will help expel (get rid of) the air and reduce the bloated feeling in your abdomen.   No driving for 24 hours (because of the anesthesia (medicine) used during the test).   You may shower.   Do not sign any important legal documents or operate any machinery for 24 hours (because of the anesthesia used during the test).  Nutrition  Drink plenty of fluids.   You may resume your normal diet.   Begin with a light meal and progress to your normal diet.   Avoid alcoholic beverages for 24 hours or as instructed by your caregiver.  Medications You may resume your normal medications unless your caregiver tells you otherwise. What you can expect today  You may experience abdominal discomfort such as a feeling of fullness or "gas" pains.   You may experience a sore throat for 2 to 3 days. This is normal. Gargling with salt water may help this.  Follow-up Your doctor will discuss the results of your test with you. SEEK IMMEDIATE MEDICAL CARE IF:  You have excessive nausea (feeling sick to your stomach) and/or vomiting.   You have severe abdominal pain and distention (swelling).   You have trouble swallowing.   You have a temperature over 100 F (37.8 C).   You have rectal bleeding or vomiting of blood.    Document Released: 03/28/2004 Document Revised: 08/03/2011 Document Reviewed: 10/09/2007 [employer] Patient Information 2012 [employer]. 

## 2011-12-18 ENCOUNTER — Encounter (HOSPITAL_COMMUNITY): Payer: Self-pay | Admitting: Gastroenterology

## 2012-03-22 IMAGING — RF DG ERCP WO/W SPHINCTEROTOMY
1 series · 3 of 3 positions shown · non-contrast
Comparison: None.

CLINICAL DATA: Status post cholecystectomy.  Postop bile leak and
biloma.

ERCP
TECHNIQUE: Multiple spot images obtained with the fluoroscopic
device and submitted for interpretation post-procedure.  ERCP was
performed by Dr. Aligabilta.

[Series 1: run · 3 of 3 slices shown]
[im 1/3]
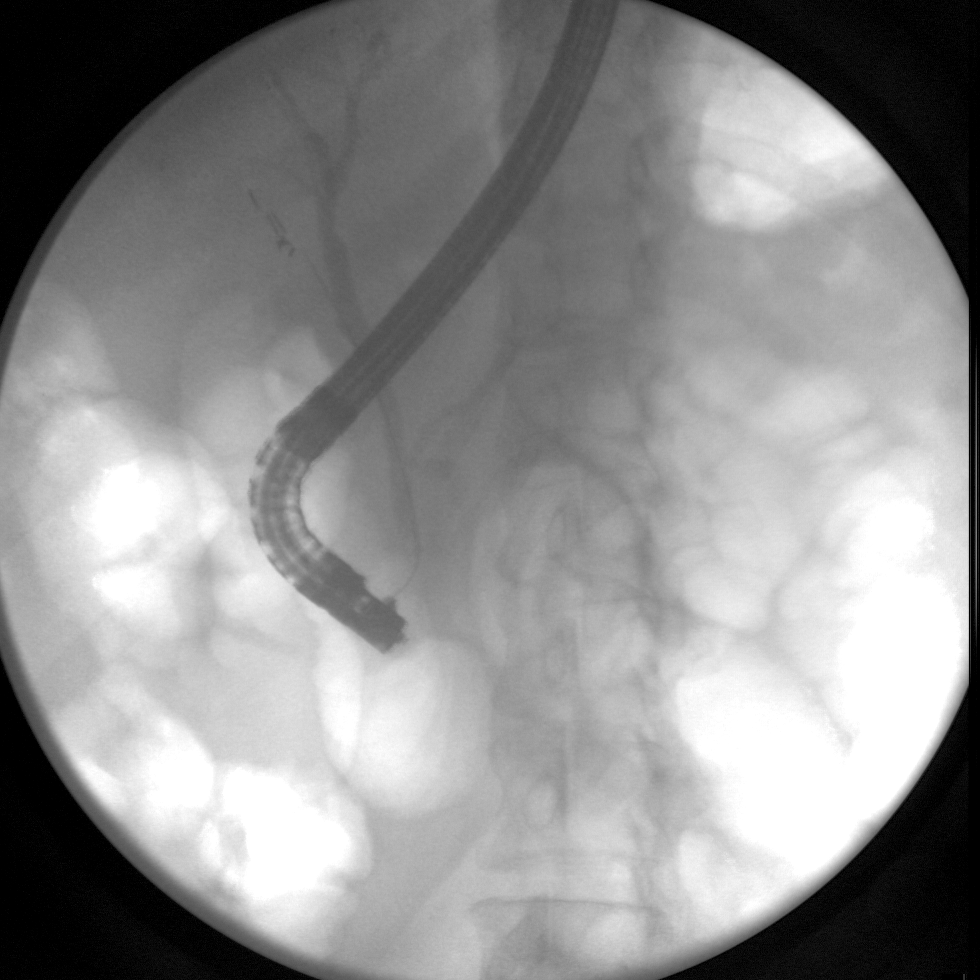
[im 2/3]
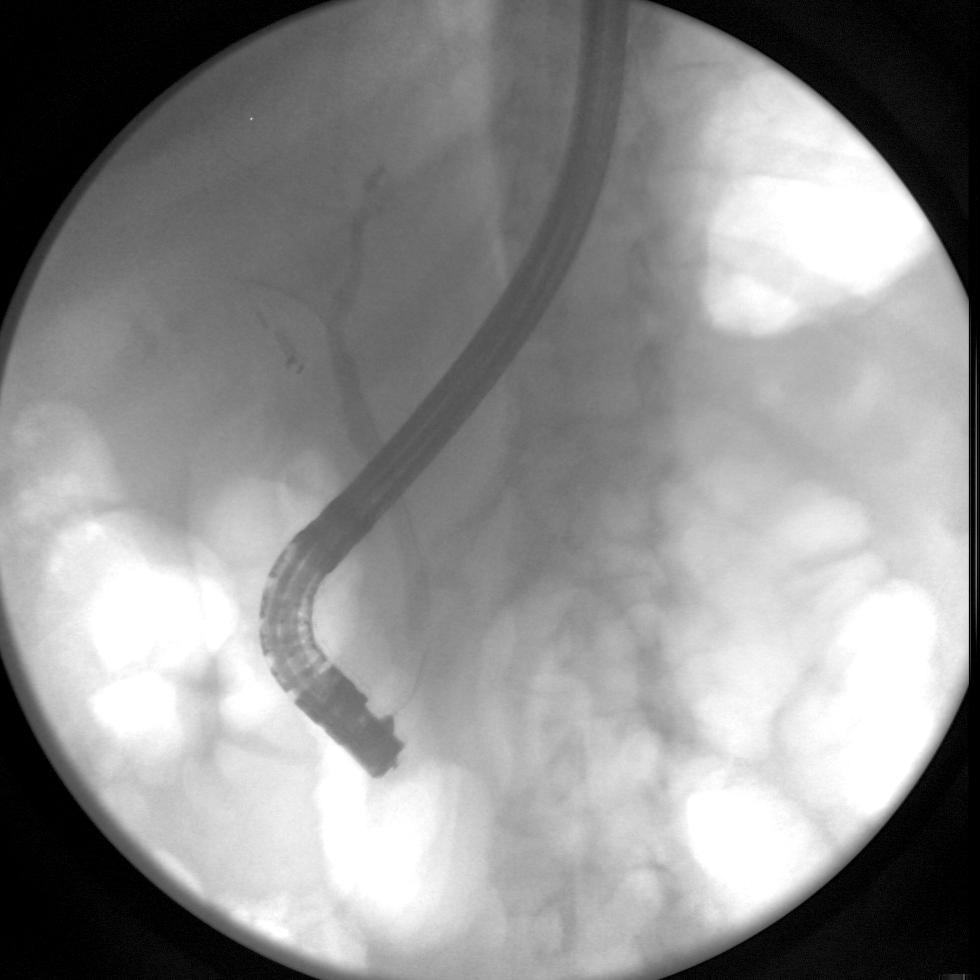
[im 3/3]
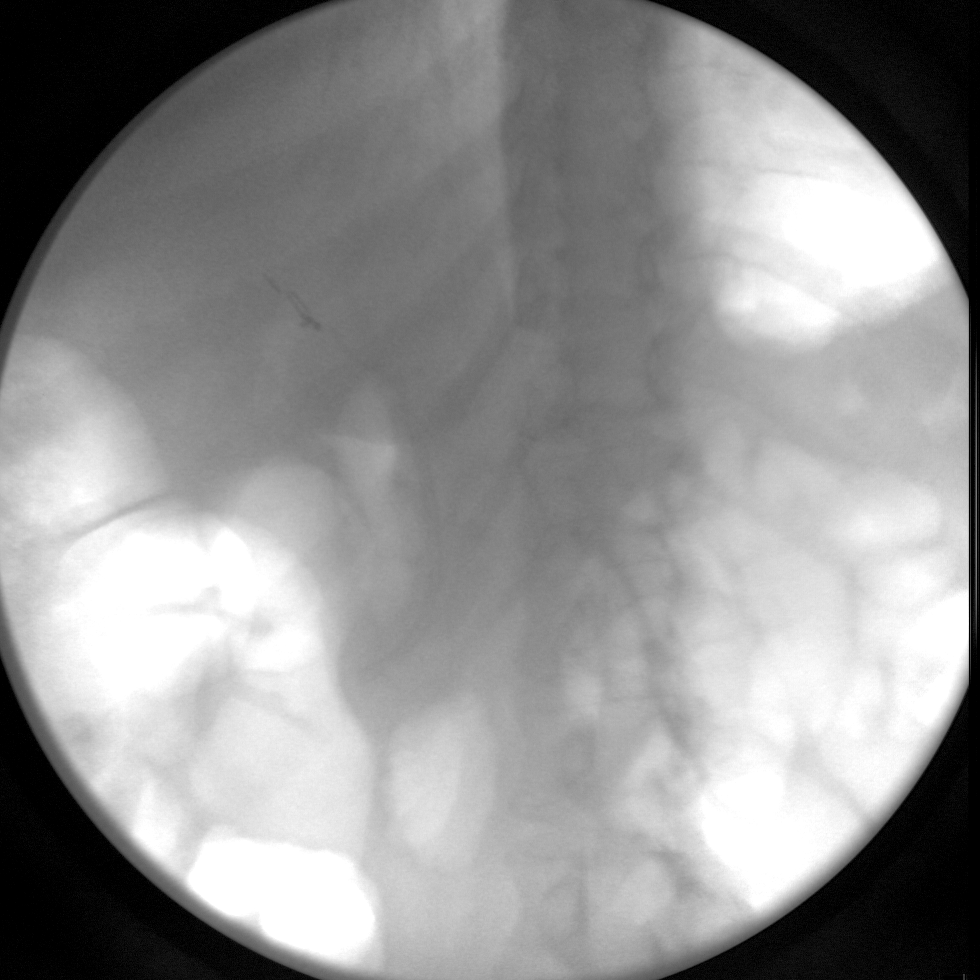

[3 of 3 positions shown; findings below may reference images not displayed]

FINDINGS: Fluoroscopic spot images show no evidence of biliary
ductal dilatation.  No obstructing calculi seen within the common
bile duct.  The final image shows placement of a stent within the
distal common bile duct, with contrast emptying from the biliary
tree.
IMPRESSION: Successful placement of internal common bile duct stent.  No
evidence of biliary obstruction.

These images were submitted for radiologic interpretation only.
Please see the procedural report for the amount of contrast and the
fluoroscopy time utilized.

## 2012-03-23 IMAGING — CR DG ABDOMEN 2V
2 series · 2 of 2 positions shown · non-contrast
Comparison: Intraoperative cholangiogram dated 08/22/2011

CLINICAL DATA: Nausea/vomiting status post ERCP dated 08/22/2011

ABDOMEN - 2 VIEW

[w abdomen upright]
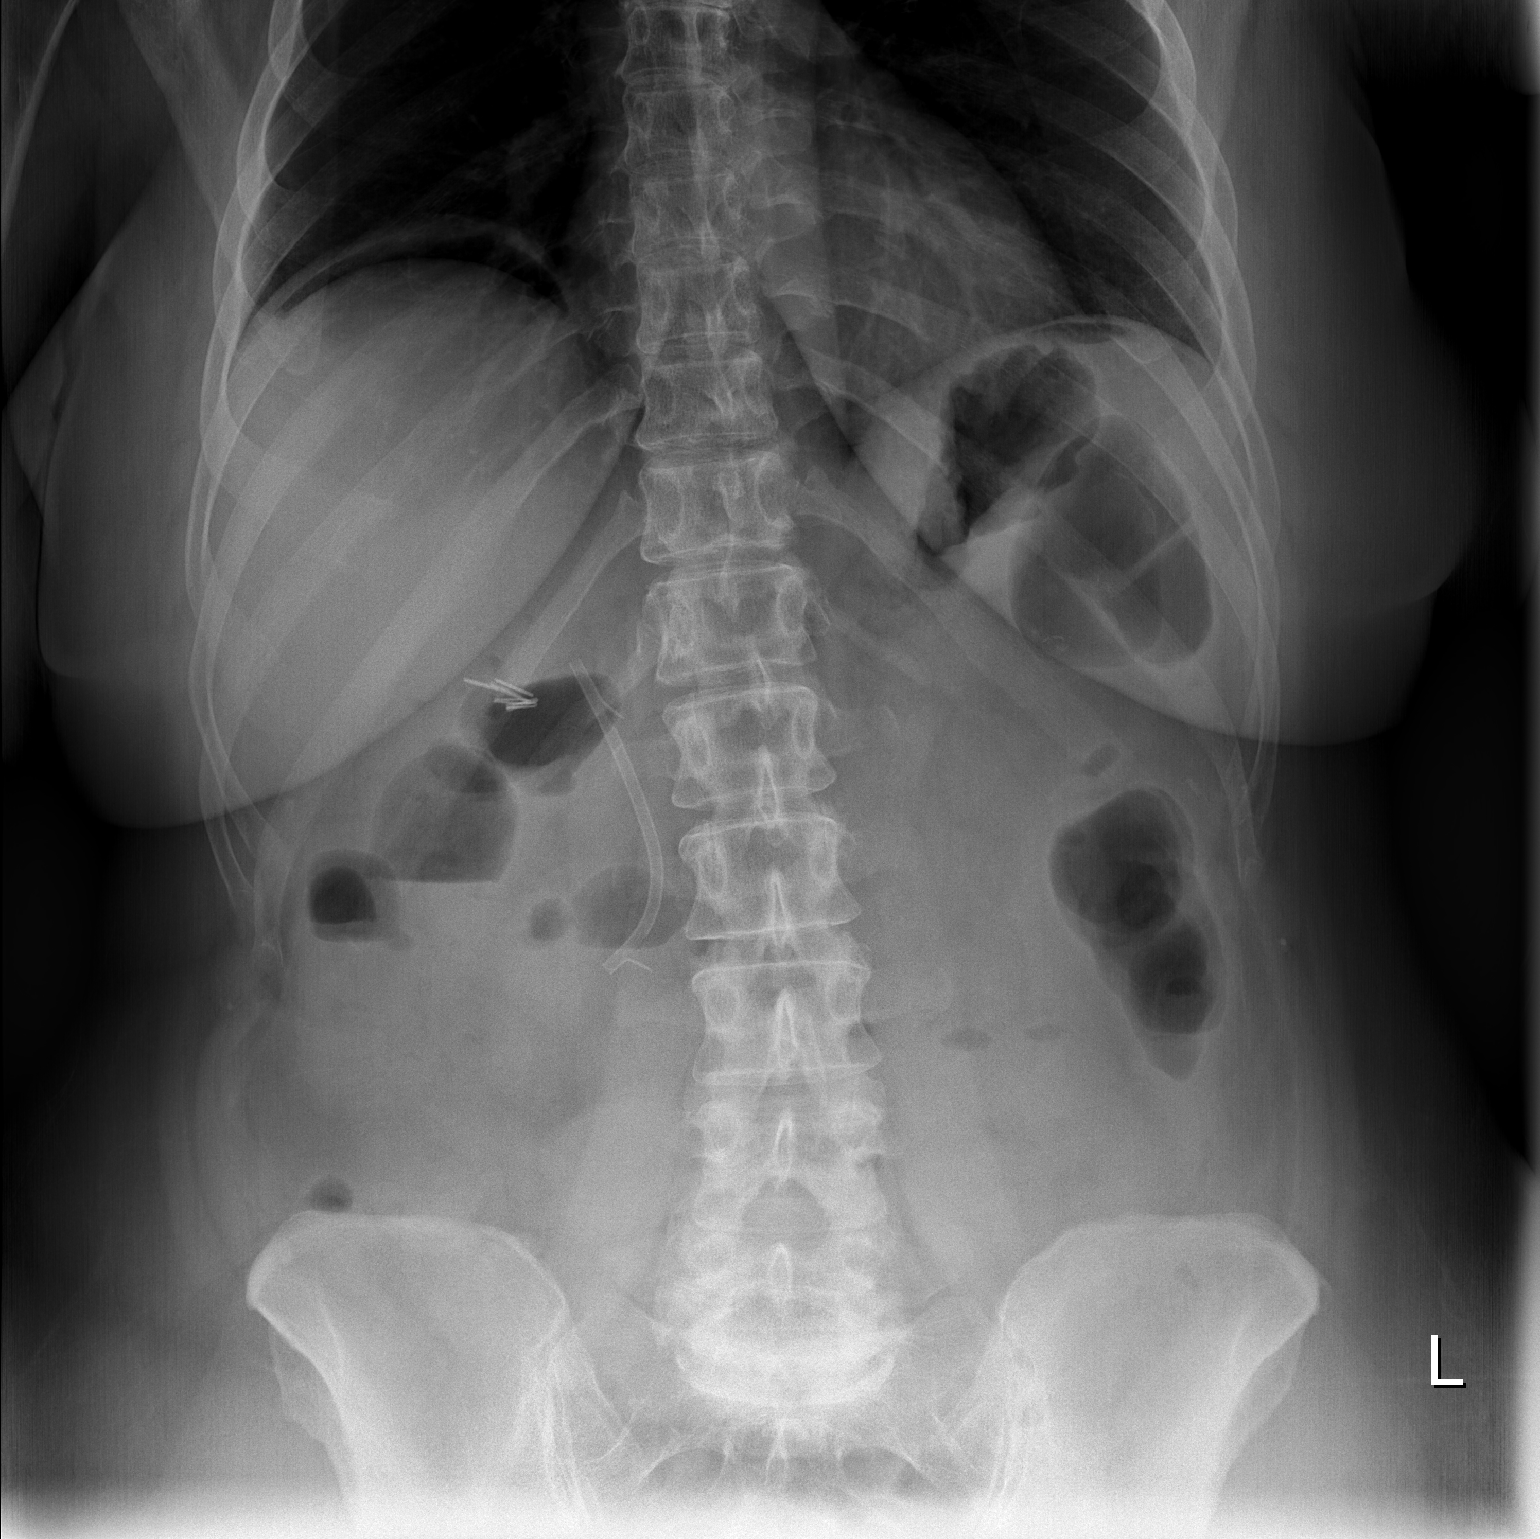

[t abdomen supine]
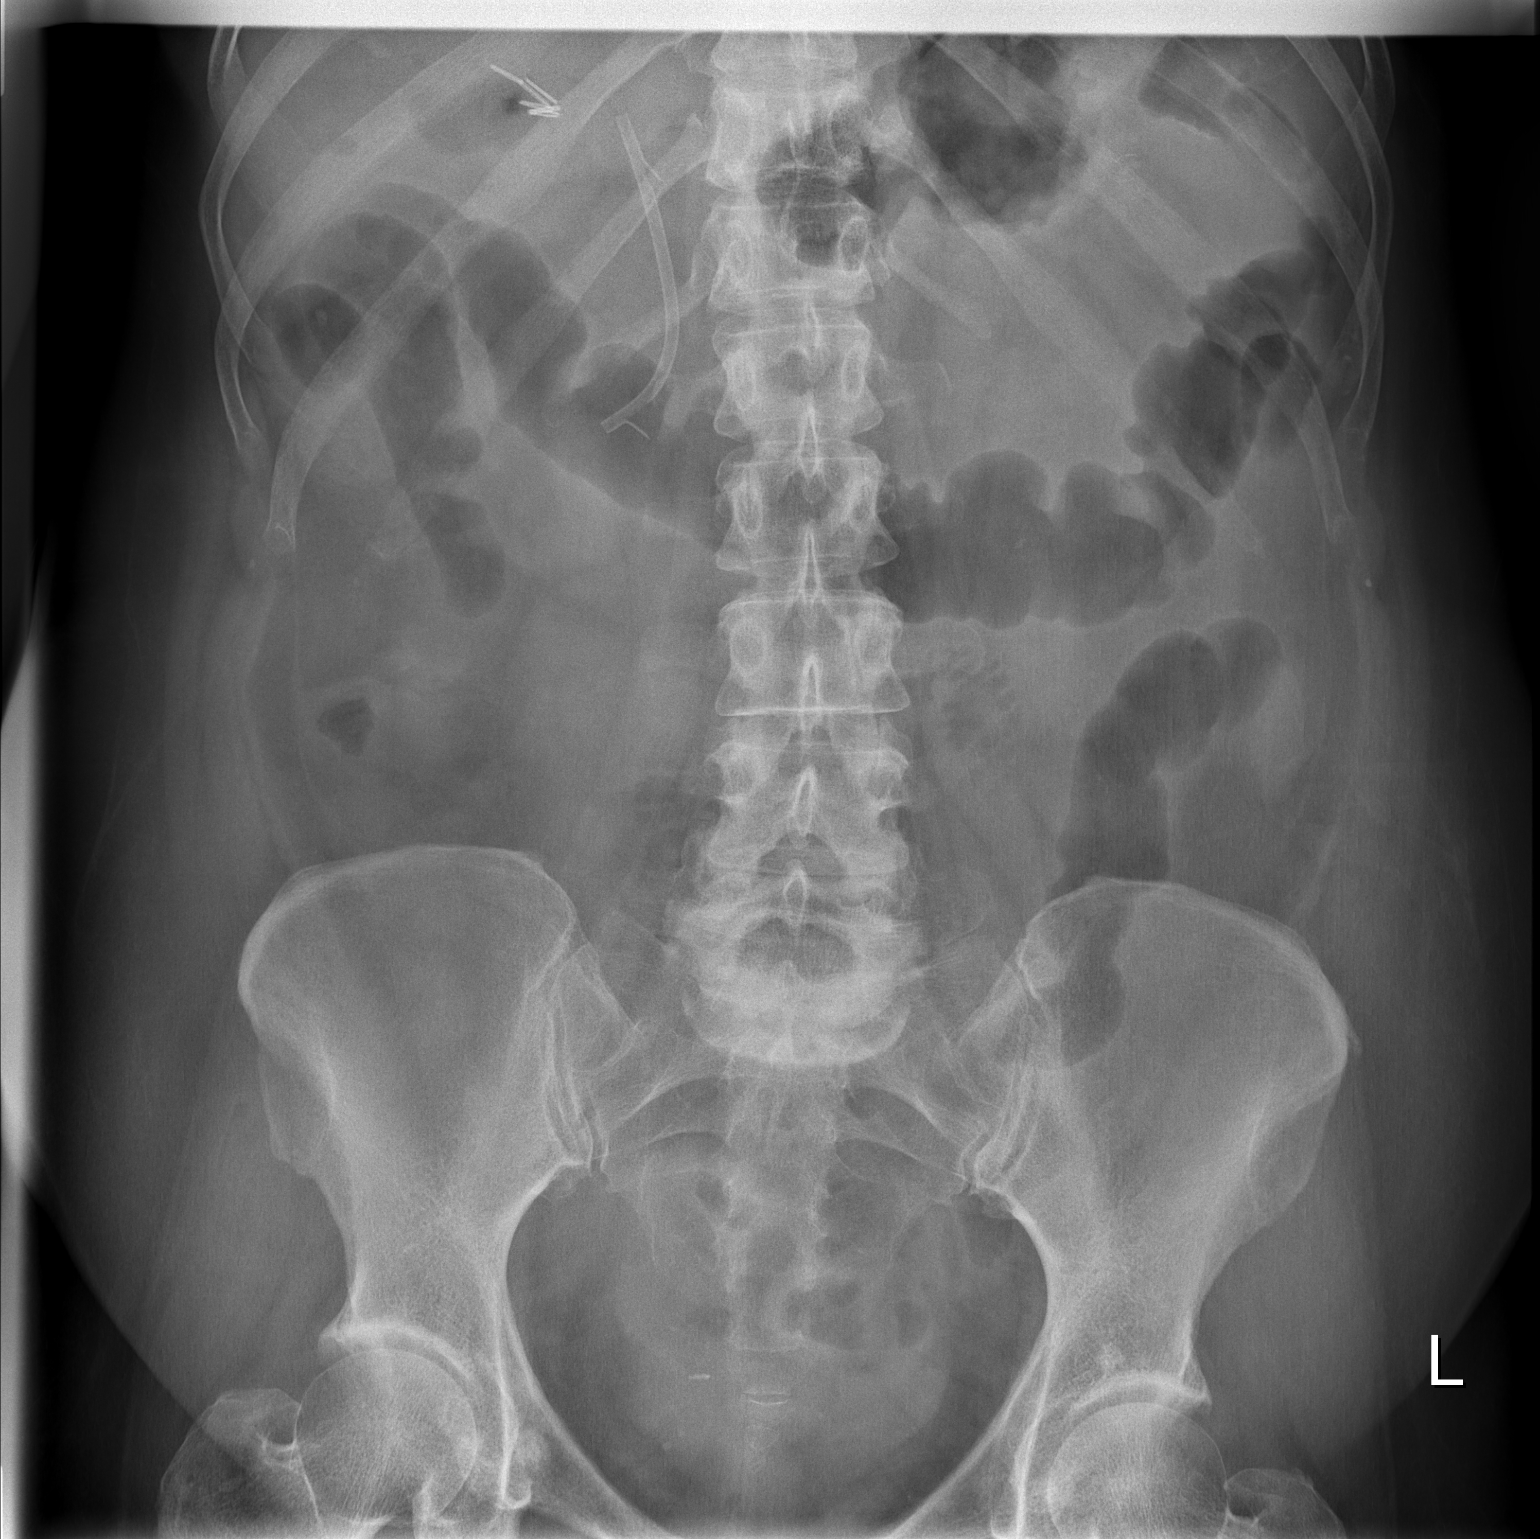

[2 of 2 positions shown; findings below may reference images not displayed]

FINDINGS: Nonobstructive bowel gas pattern.

Free air under the diaphragm on the upright view.

Internal biliary stent.  Cholecystectomy clips.

Visualized osseous structures are within normal limits.
IMPRESSION: Free air under the diaphragm on the upright view, likely
postsurgical given recent cholecystectomy, follow-up radiographs
are suggested to document resolution.

## 2012-04-16 ENCOUNTER — Ambulatory Visit: Payer: BC Managed Care – PPO | Admitting: Urology

## 2013-04-01 ENCOUNTER — Ambulatory Visit (INDEPENDENT_AMBULATORY_CARE_PROVIDER_SITE_OTHER): Payer: BC Managed Care – PPO | Admitting: Urology

## 2013-04-01 DIAGNOSIS — N1330 Unspecified hydronephrosis: Secondary | ICD-10-CM

## 2013-04-01 DIAGNOSIS — N200 Calculus of kidney: Secondary | ICD-10-CM

## 2013-04-02 ENCOUNTER — Other Ambulatory Visit: Payer: Self-pay | Admitting: Urology

## 2013-04-23 ENCOUNTER — Encounter (HOSPITAL_BASED_OUTPATIENT_CLINIC_OR_DEPARTMENT_OTHER): Payer: Self-pay | Admitting: *Deleted

## 2013-04-23 NOTE — Progress Notes (Signed)
NPO AFTER MN. ARRIVES AT 0730. NEEDS ISTAT AND EKG. WILL TAKE NEXIUM AM OF SURG W/ SIP OF WATER.

## 2013-05-05 ENCOUNTER — Encounter (HOSPITAL_BASED_OUTPATIENT_CLINIC_OR_DEPARTMENT_OTHER)
Admission: RE | Disposition: A | Discharge status: Home / Self Care | Payer: Self-pay | Source: Ambulatory Visit | Attending: Urology

## 2013-05-05 ENCOUNTER — Ambulatory Visit (HOSPITAL_COMMUNITY): Payer: BC Managed Care – PPO

## 2013-05-05 ENCOUNTER — Ambulatory Visit (HOSPITAL_BASED_OUTPATIENT_CLINIC_OR_DEPARTMENT_OTHER): Payer: BC Managed Care – PPO | Admitting: Anesthesiology

## 2013-05-05 ENCOUNTER — Ambulatory Visit (HOSPITAL_BASED_OUTPATIENT_CLINIC_OR_DEPARTMENT_OTHER)
Admission: RE | Admit: 2013-05-05 | Discharge: 2013-05-05 | Disposition: A | Discharge status: Home / Self Care | Payer: BC Managed Care – PPO | Source: Ambulatory Visit | Attending: Urology | Admitting: Urology

## 2013-05-05 ENCOUNTER — Encounter (HOSPITAL_BASED_OUTPATIENT_CLINIC_OR_DEPARTMENT_OTHER): Payer: Self-pay | Admitting: Anesthesiology

## 2013-05-05 ENCOUNTER — Other Ambulatory Visit: Payer: Self-pay

## 2013-05-05 ENCOUNTER — Encounter (HOSPITAL_BASED_OUTPATIENT_CLINIC_OR_DEPARTMENT_OTHER): Payer: Self-pay | Admitting: *Deleted

## 2013-05-05 DIAGNOSIS — Z90710 Acquired absence of both cervix and uterus: Secondary | ICD-10-CM | POA: Insufficient documentation

## 2013-05-05 DIAGNOSIS — Z87442 Personal history of urinary calculi: Secondary | ICD-10-CM | POA: Insufficient documentation

## 2013-05-05 DIAGNOSIS — Z9089 Acquired absence of other organs: Secondary | ICD-10-CM | POA: Insufficient documentation

## 2013-05-05 DIAGNOSIS — I10 Essential (primary) hypertension: Secondary | ICD-10-CM | POA: Insufficient documentation

## 2013-05-05 DIAGNOSIS — N1330 Unspecified hydronephrosis: Secondary | ICD-10-CM | POA: Insufficient documentation

## 2013-05-05 DIAGNOSIS — N200 Calculus of kidney: Secondary | ICD-10-CM | POA: Insufficient documentation

## 2013-05-05 DIAGNOSIS — K219 Gastro-esophageal reflux disease without esophagitis: Secondary | ICD-10-CM | POA: Insufficient documentation

## 2013-05-05 DIAGNOSIS — E785 Hyperlipidemia, unspecified: Secondary | ICD-10-CM | POA: Insufficient documentation

## 2013-05-05 DIAGNOSIS — E119 Type 2 diabetes mellitus without complications: Secondary | ICD-10-CM | POA: Insufficient documentation

## 2013-05-05 HISTORY — DX: Urge incontinence: N39.41

## 2013-05-05 HISTORY — DX: Personal history of other diseases of the circulatory system: Z86.79

## 2013-05-05 HISTORY — DX: Type 2 diabetes mellitus without complications: E11.9

## 2013-05-05 HISTORY — DX: Presence of spectacles and contact lenses: Z97.3

## 2013-05-05 HISTORY — PX: CYSTOSCOPY/RETROGRADE/URETEROSCOPY/STONE EXTRACTION WITH BASKET: SHX5317

## 2013-05-05 HISTORY — DX: Personal history of urinary calculi: Z87.442

## 2013-05-05 HISTORY — DX: Calculus of kidney: N20.0

## 2013-05-05 HISTORY — DX: Unspecified hydronephrosis: N13.30

## 2013-05-05 HISTORY — PX: HOLMIUM LASER APPLICATION: SHX5852

## 2013-05-05 LAB — POCT I-STAT 4, (NA,K, GLUC, HGB,HCT)
Glucose, Bld: 92 mg/dL (ref 70–99)
HCT: 38 % (ref 36.0–46.0)
Hemoglobin: 12.9 g/dL (ref 12.0–15.0)
Potassium: 3.4 mEq/L — ABNORMAL LOW (ref 3.5–5.1)
Sodium: 144 mEq/L (ref 135–145)

## 2013-05-05 LAB — GLUCOSE, CAPILLARY: Glucose-Capillary: 110 mg/dL — ABNORMAL HIGH (ref 70–99)

## 2013-05-05 SURGERY — CYSTOSCOPY, WITH CALCULUS REMOVAL USING BASKET
Anesthesia: General | Site: Ureter | Laterality: Left | Wound class: Clean

## 2013-05-05 MED ORDER — SODIUM CHLORIDE 0.9 % IR SOLN
Status: DC | PRN
  Administered 2013-05-05: 6000 mL

## 2013-05-05 MED ORDER — BELLADONNA ALKALOIDS-OPIUM 16.2-60 MG RE SUPP
RECTAL | Status: DC | PRN
  Administered 2013-05-05: 1 via RECTAL

## 2013-05-05 MED ORDER — LACTATED RINGERS IV SOLN
INTRAVENOUS | Status: DC
  Administered 2013-05-05 (×2): via INTRAVENOUS
  Filled 2013-05-05: qty 1000

## 2013-05-05 MED ORDER — ONDANSETRON HCL 4 MG/2ML IJ SOLN
4.0000 mg | Freq: Four times a day (QID) | INTRAMUSCULAR | Status: DC | PRN
  Filled 2013-05-05: qty 2

## 2013-05-05 MED ORDER — CEPHALEXIN 500 MG PO CAPS: 500.0000 mg | ORAL_CAPSULE | Freq: Four times a day (QID) | ORAL | Status: DC

## 2013-05-05 MED ORDER — PROPOFOL 10 MG/ML IV BOLUS
INTRAVENOUS | Status: DC | PRN
  Administered 2013-05-05: 150 mg via INTRAVENOUS

## 2013-05-05 MED ORDER — CIPROFLOXACIN IN D5W 400 MG/200ML IV SOLN
400.0000 mg | INTRAVENOUS | Status: DC
  Administered 2013-05-05: 400 mg via INTRAVENOUS
  Filled 2013-05-05: qty 200

## 2013-05-05 MED ORDER — FENTANYL CITRATE 0.05 MG/ML IJ SOLN
25.0000 ug | INTRAMUSCULAR | Status: DC | PRN
  Filled 2013-05-05: qty 1

## 2013-05-05 MED ORDER — METOCLOPRAMIDE HCL 5 MG/ML IJ SOLN
INTRAMUSCULAR | Status: DC | PRN
  Administered 2013-05-05: 10 mg via INTRAVENOUS

## 2013-05-05 MED ORDER — KETOROLAC TROMETHAMINE 30 MG/ML IJ SOLN
INTRAMUSCULAR | Status: DC | PRN
  Administered 2013-05-05: 30 mg via INTRAVENOUS

## 2013-05-05 MED ORDER — IOHEXOL 350 MG/ML SOLN
INTRAVENOUS | Status: DC | PRN
  Administered 2013-05-05: 10 mL

## 2013-05-05 MED ORDER — DEXAMETHASONE SODIUM PHOSPHATE 4 MG/ML IJ SOLN
INTRAMUSCULAR | Status: DC | PRN
  Administered 2013-05-05: 10 mg via INTRAVENOUS

## 2013-05-05 MED ORDER — ACETAMINOPHEN 325 MG PO TABS
650.0000 mg | ORAL_TABLET | ORAL | Status: DC | PRN
  Filled 2013-05-05: qty 2

## 2013-05-05 MED ORDER — OXYBUTYNIN CHLORIDE 5 MG PO TABS: 5.0000 mg | ORAL_TABLET | Freq: Three times a day (TID) | ORAL | Status: DC

## 2013-05-05 MED ORDER — PROMETHAZINE HCL 25 MG/ML IJ SOLN
6.2500 mg | INTRAMUSCULAR | Status: DC | PRN
  Filled 2013-05-05: qty 1

## 2013-05-05 MED ORDER — LABETALOL HCL 5 MG/ML IV SOLN
INTRAVENOUS | Status: DC | PRN
  Administered 2013-05-05: 5 mg via INTRAVENOUS

## 2013-05-05 MED ORDER — FENTANYL CITRATE 0.05 MG/ML IJ SOLN
INTRAMUSCULAR | Status: DC | PRN
  Administered 2013-05-05 (×4): 50 ug via INTRAVENOUS

## 2013-05-05 MED ORDER — CEFAZOLIN SODIUM-DEXTROSE 2-3 GM-% IV SOLR
2.0000 g | Freq: Once | INTRAVENOUS | Status: AC
  Administered 2013-05-05: 2 g via INTRAVENOUS
  Filled 2013-05-05: qty 50

## 2013-05-05 MED ORDER — LIDOCAINE HCL (CARDIAC) 20 MG/ML IV SOLN
INTRAVENOUS | Status: DC | PRN
  Administered 2013-05-05: 50 mg via INTRAVENOUS

## 2013-05-05 MED ORDER — LACTATED RINGERS IV SOLN
INTRAVENOUS | Status: DC
  Filled 2013-05-05: qty 1000

## 2013-05-05 MED ORDER — ONDANSETRON HCL 4 MG/2ML IJ SOLN
INTRAMUSCULAR | Status: DC | PRN
  Administered 2013-05-05: 4 mg via INTRAVENOUS

## 2013-05-05 MED ORDER — SODIUM CHLORIDE 0.9 % IJ SOLN
3.0000 mL | INTRAMUSCULAR | Status: DC | PRN
  Filled 2013-05-05: qty 3

## 2013-05-05 MED ORDER — MORPHINE SULFATE 2 MG/ML IJ SOLN
2.0000 mg | INTRAMUSCULAR | Status: DC | PRN
  Filled 2013-05-05: qty 1

## 2013-05-05 MED ORDER — MIDAZOLAM HCL 5 MG/5ML IJ SOLN
INTRAMUSCULAR | Status: DC | PRN
  Administered 2013-05-05: 1 mg via INTRAVENOUS

## 2013-05-05 MED ORDER — SODIUM CHLORIDE 0.9 % IV SOLN
250.0000 mL | INTRAVENOUS | Status: DC | PRN
  Filled 2013-05-05: qty 250

## 2013-05-05 MED ORDER — SODIUM CHLORIDE 0.9 % IJ SOLN
3.0000 mL | Freq: Two times a day (BID) | INTRAMUSCULAR | Status: DC
  Filled 2013-05-05: qty 3

## 2013-05-05 MED ORDER — OXYCODONE HCL 5 MG PO TABS
5.0000 mg | ORAL_TABLET | ORAL | Status: DC | PRN
  Filled 2013-05-05: qty 2

## 2013-05-05 MED ORDER — ACETAMINOPHEN 650 MG RE SUPP
650.0000 mg | RECTAL | Status: DC | PRN
  Filled 2013-05-05: qty 1

## 2013-05-05 SURGICAL SUPPLY — 21 items
ADAPTER CATH URET PLST 4-6FR (CATHETERS) IMPLANT
BAG DRAIN URO-CYSTO SKYTR STRL (DRAIN) ×3 IMPLANT
BASKET STNLS GEMINI 4WIRE 3FR (BASKET) IMPLANT
BASKET ZERO TIP NITINOL 2.4FR (BASKET) ×3 IMPLANT
CANISTER SUCT LVC 12 LTR MEDI- (MISCELLANEOUS) ×3 IMPLANT
CATH INTERMIT  6FR 70CM (CATHETERS) ×3 IMPLANT
CLOTH BEACON ORANGE TIMEOUT ST (SAFETY) ×3 IMPLANT
DRAPE CAMERA CLOSED 9X96 (DRAPES) ×3 IMPLANT
GLOVE BIO SURGEON STRL SZ8 (GLOVE) ×3 IMPLANT
GOWN PREVENTION PLUS LG XLONG (DISPOSABLE) ×3 IMPLANT
GOWN STRL REIN XL XLG (GOWN DISPOSABLE) ×3 IMPLANT
GUIDEWIRE 0.038 PTFE COATED (WIRE) IMPLANT
GUIDEWIRE ANG ZIPWIRE 038X150 (WIRE) IMPLANT
GUIDEWIRE STR DUAL SENSOR (WIRE) ×3 IMPLANT
IV NS IRRIG 3000ML ARTHROMATIC (IV SOLUTION) ×9 IMPLANT
LASER FIBER DISP (UROLOGICAL SUPPLIES) ×6 IMPLANT
NS IRRIG 500ML POUR BTL (IV SOLUTION) IMPLANT
PACK CYSTOSCOPY (CUSTOM PROCEDURE TRAY) ×3 IMPLANT
SHEATH ACCESS URETERAL 38CM (SHEATH) ×3 IMPLANT
STENT URET 6FRX24 CONTOUR (STENTS) ×3 IMPLANT
WATER STERILE IRR 500ML POUR (IV SOLUTION) ×3 IMPLANT

## 2013-05-05 NOTE — Anesthesia Postprocedure Evaluation (Signed)
Anesthesia Post Note  Patient: [name]  Procedure(s) Performed: Procedure(s) (LRB): CYSTOSCOPY/RETROGRADE/URETEROSCOPY/STONE EXTRACTION WITH BASKET   Procedure:Cysto, Right Retrograde Pyelogram, Left Ureteroscopy with Holmium Laser, Extraction of Stones (Bilateral) HOLMIUM LASER APPLICATION (Left)  Anesthesia type: General  Patient location: PACU  Post pain: Pain level controlled  Post assessment: Post-op Vital signs reviewed  Last Vitals:  Filed Vitals:   05/05/13 1211  BP: 115/72  Pulse: 81  Temp: 36 C  Resp: 20    Post vital signs: Reviewed  Level of consciousness: sedated  Complications: No apparent anesthesia complications

## 2013-05-05 NOTE — Op Note (Signed)
Preoperative diagnosis: Right hydronephrosis, left renal pelvic stone  Postoperative diagnosis: Same, without evidence of obstruction of the right ureter   Procedure: Cystoscopy, right retrograde ureteropyelogram with interpretive fluoroscopy, left retrograde ureteropyelogram with interpretive fluoroscopy, left ureteroscopy, holmium laser and extraction of left renal pelvic calculi, double-J stent    Surgeon: Bertram Millard. Kaarin Pardy, M.D.   Anesthesia: Gen.   Complications: None  Specimen(s): Stone fragments, to husband  Drain(s): 24 x 6 French contour stent with string and left ureter  Indications: 63 year-old female with symptomatic left renal pelvic stone. She has intermittent gross hematuria. She had, on a CT scan in July of this year, right hydronephrosis without evident filling defect of the renal pelvis or ureter. She had an 8mm left renal pelvic stone and 2-3 other small calyceal calculi. She presents at this time for holmium laser lithotripsy and extraction of her left ureteral stone, as well as right retrograde ureteropyelogram. Risks and complications of the procedure have been discussed with the patient who understands and desires to proceed.    Technique and findings: The patient was properly identified and marked in the holding area. She received a small dose of Cipro, which more than likely created an erythematous area at the IV site. That was stopped, and she then received Ancef IV. She was taken the operating room where general anesthetic was administered with the LMA. She was placed in the dorsolithotomy position. Genitalia and perineum were prepped and draped. Timeout was then performed.  I passed a 22 French panendoscope and visualized the entire bladder which was normal. Both ureteral orifices were normal in configuration and location. The right ureteral orifice was cannulated with the 6 open-ended catheter.    Retrograde ureteropyelogram was performed. This showed minimal  narrowing of the distal ureter on the right, but there was no significant stricture noted, and no filling defect. It was easy filling of the entire ureter. There was mild pyelocaliectasis on the right side but no other abnormalities were noted.  I then performed a left retrograde ureteropyelogram using the same catheter and contrast. The entire ureter was normal, without evidence of filling defect or hydronephrosis. There was a filling defect in the left renal pelvis consistent with 8-9 mm pelvic stone noted on prior CT scan. I then passed a guidewire through the open-ended catheter, and removed the cystoscope. I dilated the ureter with first the inner core of a 34 cm digital access sheath, and then I dilated to the entire size of the outer core of the sheath. I removed the inner core, and passed the flexible digital ureteroscope. The renal pelvic stone was encountered. There was one small calyceal stone I can see in the lower pole. I could not noted is free with the beak of the scope. I then passed a 200  fiber. The renal pelvic stone was fragmented into multiple smaller calculi. This was somewhat difficult due to some bleeding that I then encountered. However, I eventually fragmented the stone into the smaller stones which were then extracted, mostly. The renal calyceal stone that I saw previous was fragmented with the laser energy as well. The small fragments were removed. Fluoroscopically, there were 2-3 smaller fragments in the renal pelvis that I could not see because of some blood clots. I felt that these would easily pass following placement of the double-J stent. After a thorough inspection of the entire calyceal system at this point, no further stones easily evident, I then removed the ureteroscope. Through the digital access sheath, I  then passed a guidewire. The sheath was removed, and cystoscopically I then advanced a 24 cm x 6 French double-J stent. The string was left intact. The bladder was  drained, and after good proximal and distal curls were seen using fluoroscopic and cystoscopic guidance, the scope was removed. I then brought the string out through the urethra we take this to the patient's thigh. She was awakened and taken to the PACU in stable condition.  She will followup appropriately. We will discharge her home on 3 days of Keflex.

## 2013-05-05 NOTE — Progress Notes (Addendum)
Dr. Retta Diones checked hand and decided to stop cipro and give ancef.    There was one rised area no itching.

## 2013-05-05 NOTE — Anesthesia Procedure Notes (Signed)
Procedure Name: LMA Insertion Date/Time: 05/05/2013 9:00 AM Performed by: Norva Pavlov Pre-anesthesia Checklist: Patient identified, Emergency Drugs available, Suction available and Patient being monitored Patient Re-evaluated:Patient Re-evaluated prior to inductionOxygen Delivery Method: Circle System Utilized Preoxygenation: Pre-oxygenation with 100% oxygen Intubation Type: IV induction Ventilation: Mask ventilation without difficulty LMA: LMA inserted LMA Size: 4.0 Number of attempts: 1 Airway Equipment and Method: bite block Placement Confirmation: positive ETCO2 Tube secured with: Tape Dental Injury: Teeth and Oropharynx as per pre-operative assessment

## 2013-05-05 NOTE — H&P (Signed)
H&P  Chief Complaint: Kidney stone  History of Present Illness: [name] is a 62 y.o. year old female who presents for left ureteroscopic stone management as well as right retrograde ureteropyelogram. Her history is below:   She has a history of recurrent urolithiasis.  I last saw her approximately 2 years ago, when she was treated with lithotripsy for a left renal stone.  She had intermittent leftt hydronephrosis and it resolved following stone treatment.  She recently had abdominal pain, and a CT of the abdomen in June, 2014 revealed right hydronephrosis without evidence of right-sided urolithiasis, as well as a left renal pelvic stone 8 mm in size and several other smaller left renal calculi.  She has some colon abnormalities which was eventually evaluated with a colonoscopy by Dr. Teena Dunk in [place], [place].  By report was normal.  She has not had any flank pain recently.  She will have dark urine at times.  She denies any fever or chills.   Past Medical History  Diagnosis Date  . Hyperlipidemia   . GERD (gastroesophageal reflux disease)   . Depression   . Headache(784.0)   . Arthritis     fingers  . Hydronephrosis, right   . Renal calculi     LEFT  . History of kidney stones   . Type 2 diabetes mellitus   . History of hypertension     NO MEDS NOW  . Urge urinary incontinence   . Wears glasses     Past Surgical History  Procedure Laterality Date  . Laparoscopic assisted vaginal hysterectomy  07/13/2011    Procedure: LAPAROSCOPIC ASSISTED VAGINAL HYSTERECTOMY;  Surgeon: Melony Overly;  Location: [facility];  Service: Gynecology;  Laterality: N/A;  . Salpingoophorectomy  07/13/2011    Procedure: SALPINGO OOPHERECTOMY;  Surgeon: Melony Overly;  Location: [facility];  Service: Gynecology;  Laterality: Bilateral;  . Anterior and posterior repair  07/13/2011    Procedure: ANTERIOR (CYSTOCELE) AND POSTERIOR REPAIR (RECTOCELE);  Surgeon: Melony Overly;  Location: [facility];  Service:  Gynecology;  Laterality: N/A;  . Bladder suspension  07/13/2011    Procedure: TRANSVAGINAL TAPE (TVT) PROCEDURE;  Surgeon: Melony Overly;  Location: [facility];  Service: Gynecology;  Laterality: N/A;  . Cystoscopy  07/13/2011    Procedure: CYSTOSCOPY;  Surgeon: Melony Overly;  Location: [facility];  Service: Gynecology;  Laterality: N/A;  . Ercp  08/22/2011    Procedure: ENDOSCOPIC RETROGRADE CHOLANGIOPANCREATOGRAPHY (ERCP);  Surgeon: Barrie Folk, MD;  Location[facility];  Service: Endoscopy;  Laterality: N/A;  . Biliary stent placement  08/22/2011    Procedure: BILIARY STENT PLACEMENT;  Surgeon: Barrie Folk, MD;  Location: [facility] ENDOSCOPY;  Service: Endoscopy;  Laterality: N/A;  49f x 5cm stent for bile leak. placed in CBD  . Ercp  12/15/2011    Procedure: ENDOSCOPIC RETROGRADE CHOLANGIOPANCREATOGRAPHY (ERCP);  Surgeon: Barrie Folk, MD;  Location: [facility];  Service: Endoscopy;  Laterality: N/A;  stent removal/mod. sedation  . Cysto/ ureteroscopic laser litho of stones  2009  &  09-30-2010  . Cysto/ right ureteral balloon dilation/ right ureteroscopic stone extractio/ stent  11-29-2010  . Extracorporeal shock wave lithotripsy Left 04-17-2011  . Cysto/ retrograde pyelogram/ right ureteral dilation  10-28-2008  . Laparoscopic cholecystectomy  08-17-2011    Home Medications:  No prescriptions prior to admission    Allergies:  Allergies  Allergen Reactions  . Flomax [Tamsulosin Hcl] Swelling    JAWS  . Sulfa Antibiotics  Swelling    Family History  Problem Relation Age of Onset  . Anesthesia problems Neg Hx   . Hypotension Neg Hx   . Malignant hyperthermia Neg Hx   . Pseudochol deficiency Neg Hx     Social History:  reports that she has never smoked. She has never used smokeless tobacco. She reports that she does not drink alcohol or use illicit drugs.  ROS: Genitourinary, constitutional, skin, eye, otolaryngeal, hematologic/lymphatic, cardiovascular, pulmonary, endocrine,  musculoskeletal, gastrointestinal, neurological and psychiatric system(s) were reviewed and pertinent findings if present are noted.  Genitourinary: hematuria.  Gastrointestinal: flank pain and abdominal pain.   Physical Exam:  Vital signs in last 24 hours:   General:  Alert and oriented, No acute distress HEENT: Normocephalic, atraumatic Neck: No JVD or lymphadenopathy Cardiovascular: Regular rate and rhythm Lungs: Clear bilaterally Abdomen: Soft, nontender, nondistended, no abdominal masses Back: No CVA tenderness Extremities: No edema Neurologic: Grossly intact  Laboratory Data:  No results found for this or any previous visit (from the past 24 hour(s)). No results found for this or any previous visit (from the past 240 hour(s)). Creatinine: No results found for this basename: CREATININE,  in the last 168 hours  Radiologic Imaging: No results found.  Impression/Assessment:  1.  Left renal pelvic stone, probably intermittently symptomatic  2.  Microscopic hematuria, most likely related to a left renal stone  3.  Right hydronephrosis-this is intermittent but persistent.  This needs evaluation   Plan:  1.  I recommended ureteroscopy with holmium laser of her left renal calculi over lithotripsy, as I think it will be more in its treatment  2.  At the same time, I have recommended right retrograde ureteropyelogram. Possible right ureteroscopy  Chelsea Aus 05/05/2013, 6:05 AM  Bertram Millard. Fionn Stracke MD

## 2013-05-05 NOTE — Transfer of Care (Signed)
Immediate Anesthesia Transfer of Care Note  Patient: [name]  Procedure(s) Performed: Procedure(s) (LRB): CYSTOSCOPY/RETROGRADE/URETEROSCOPY/STONE EXTRACTION WITH BASKET   Procedure:Cysto, Right Retrograde Pyelogram, Left Ureteroscopy with Holmium Laser, Extraction of Stones (Bilateral) HOLMIUM LASER APPLICATION (Left)  Patient Location: PACU  Anesthesia Type: General  Level of Consciousness: awake, alert  and oriented  Airway & Oxygen Therapy: Patient Spontanous Breathing and Patient connected to face mask oxygen  Post-op Assessment: Report given to PACU RN and Post -op Vital signs reviewed and stable  Post vital signs: Reviewed and stable  Complications: No apparent anesthesia complications

## 2013-05-05 NOTE — Anesthesia Preprocedure Evaluation (Signed)
Anesthesia Evaluation  Patient identified by MRN, date of birth, ID band Patient awake    Reviewed: Allergy & Precautions, H&P , NPO status , Patient's Chart, lab work & pertinent test results  Airway Mallampati: II TM Distance: >3 FB     Dental  (+) Teeth Intact and Dental Advisory Given   Pulmonary neg pulmonary ROS,  breath sounds clear to auscultation  Pulmonary exam normal       Cardiovascular hypertension, negative cardio ROS  Rhythm:Regular Rate:Normal     Neuro/Psych  Headaches, Depression    GI/Hepatic negative GI ROS, Neg liver ROS, GERD-  Medicated,  Endo/Other  negative endocrine ROSdiabetes, Type 2, Oral Hypoglycemic Agents  Renal/GU Renal diseaseRenal calculus  negative genitourinary   Musculoskeletal negative musculoskeletal ROS (+)   Abdominal   Peds  Hematology negative hematology ROS (+)   Anesthesia Other Findings   Reproductive/Obstetrics negative OB ROS                           Anesthesia Physical Anesthesia Plan  ASA: II  Anesthesia Plan: General   Post-op Pain Management:    Induction: Intravenous  Airway Management Planned: LMA  Additional Equipment:   Intra-op Plan:   Post-operative Plan: Extubation in OR  Informed Consent: I have reviewed the patients History and Physical, chart, labs and discussed the procedure including the risks, benefits and alternatives for the proposed anesthesia with the patient or authorized representative who has indicated his/her understanding and acceptance.   Dental advisory given  Plan Discussed with: CRNA  Anesthesia Plan Comments:         Anesthesia Quick Evaluation

## 2013-05-06 ENCOUNTER — Encounter (HOSPITAL_BASED_OUTPATIENT_CLINIC_OR_DEPARTMENT_OTHER): Payer: Self-pay | Admitting: Urology

## 2013-06-03 ENCOUNTER — Ambulatory Visit (INDEPENDENT_AMBULATORY_CARE_PROVIDER_SITE_OTHER): Payer: BC Managed Care – PPO | Admitting: Urology

## 2013-06-03 DIAGNOSIS — N135 Crossing vessel and stricture of ureter without hydronephrosis: Secondary | ICD-10-CM

## 2013-06-03 DIAGNOSIS — N200 Calculus of kidney: Secondary | ICD-10-CM

## 2013-10-30 ENCOUNTER — Other Ambulatory Visit: Payer: Self-pay | Admitting: Dermatology

## 2013-12-04 ENCOUNTER — Ambulatory Visit (HOSPITAL_COMMUNITY)
Admission: RE | Admit: 2013-12-04 | Discharge: 2013-12-04 | Disposition: A | Discharge status: Home / Self Care | Payer: BC Managed Care – PPO | Source: Ambulatory Visit | Attending: Urology | Admitting: Urology

## 2013-12-04 ENCOUNTER — Other Ambulatory Visit: Payer: Self-pay | Admitting: Urology

## 2013-12-04 DIAGNOSIS — Z87442 Personal history of urinary calculi: Secondary | ICD-10-CM

## 2013-12-04 DIAGNOSIS — N200 Calculus of kidney: Secondary | ICD-10-CM | POA: Insufficient documentation

## 2013-12-09 ENCOUNTER — Ambulatory Visit (INDEPENDENT_AMBULATORY_CARE_PROVIDER_SITE_OTHER): Payer: BC Managed Care – PPO | Admitting: Urology

## 2013-12-09 DIAGNOSIS — N200 Calculus of kidney: Secondary | ICD-10-CM

## 2013-12-09 DIAGNOSIS — N390 Urinary tract infection, site not specified: Secondary | ICD-10-CM

## 2014-01-06 ENCOUNTER — Ambulatory Visit (INDEPENDENT_AMBULATORY_CARE_PROVIDER_SITE_OTHER): Payer: BC Managed Care – PPO | Admitting: Urology

## 2014-01-06 DIAGNOSIS — N200 Calculus of kidney: Secondary | ICD-10-CM

## 2014-07-05 IMAGING — CR DG ABDOMEN 1V
1 series · 1 of 1 positions shown · non-contrast
Comparison: CT 02/18/2013

CLINICAL DATA: Nephrolithiasis left kidney

EXAM:
ABDOMEN - 1 VIEW

[view not recorded]
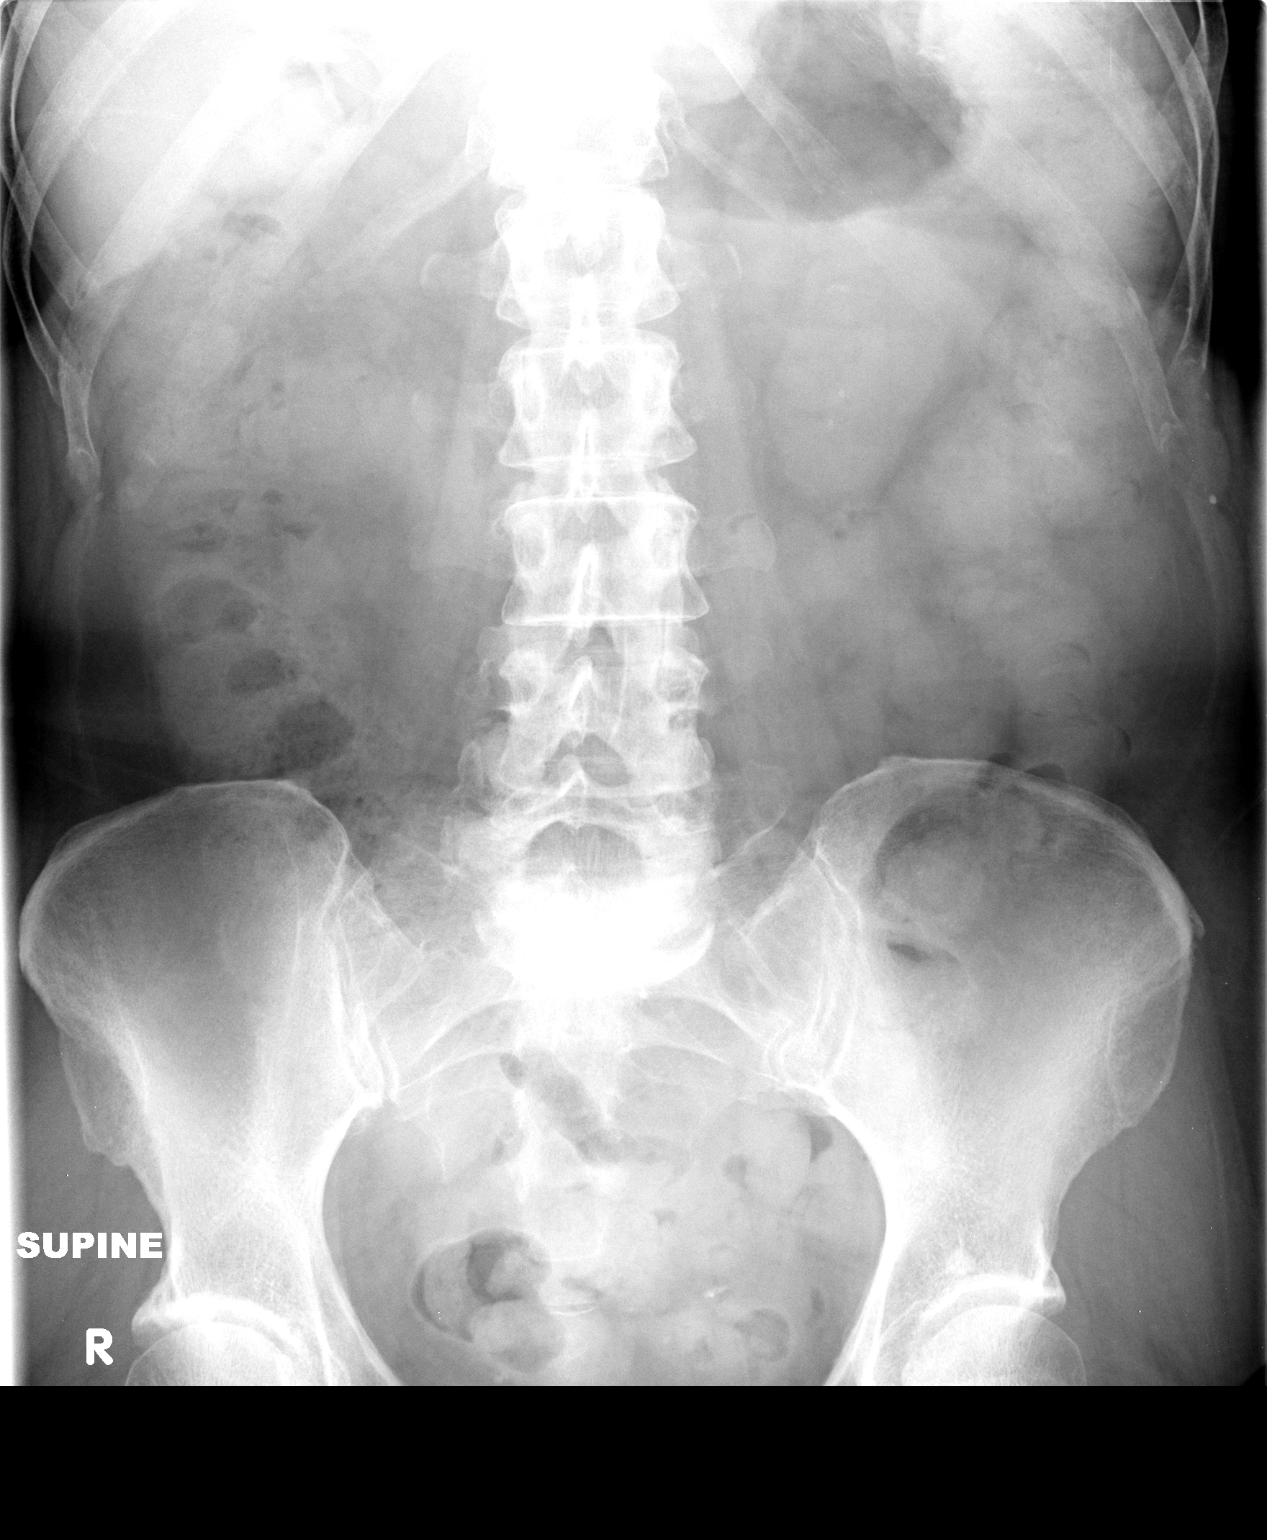

[1 of 1 positions shown; findings below may reference images not displayed]

FINDINGS: Small left lower pole renal calculi measuring 2 mm each. Possible
stone overlying the left renal pelvis. No calculi on the right.
Calcifications in the pelvis consistent with vascular
calcifications.

Mild constipation without bowel obstruction.
IMPRESSION: Left renal calculi.

## 2014-11-26 ENCOUNTER — Other Ambulatory Visit: Payer: Self-pay | Admitting: Dermatology

## 2015-08-11 ENCOUNTER — Other Ambulatory Visit: Payer: Self-pay | Admitting: Obstetrics & Gynecology

## 2015-08-11 DIAGNOSIS — R928 Other abnormal and inconclusive findings on diagnostic imaging of breast: Secondary | ICD-10-CM

## 2015-08-18 ENCOUNTER — Ambulatory Visit
Admission: RE | Admit: 2015-08-18 | Discharge: 2015-08-18 | Disposition: A | Discharge status: Home / Self Care | Payer: BC Managed Care – PPO | Source: Ambulatory Visit | Attending: Obstetrics & Gynecology | Admitting: Obstetrics & Gynecology

## 2015-08-18 DIAGNOSIS — R928 Other abnormal and inconclusive findings on diagnostic imaging of breast: Secondary | ICD-10-CM

## 2015-08-25 ENCOUNTER — Ambulatory Visit (INDEPENDENT_AMBULATORY_CARE_PROVIDER_SITE_OTHER): Payer: BC Managed Care – PPO | Admitting: Diagnostic Neuroimaging

## 2015-08-25 ENCOUNTER — Encounter: Payer: Self-pay | Admitting: Diagnostic Neuroimaging

## 2015-08-25 VITALS — BP 125/77 | HR 79 | Ht 65.0 in | Wt 148.0 lb

## 2015-08-25 DIAGNOSIS — R413 Other amnesia: Secondary | ICD-10-CM | POA: Diagnosis not present

## 2015-08-25 NOTE — Progress Notes (Signed)
[facility]  PATIENT: [name] DOB: Sep 04, 1950  REFERRING CLINICIAN: Howard  HISTORY FROM: patient  REASON FOR VISIT: new consult    HISTORICAL  CHIEF COMPLAINT:  Chief Complaint  Patient presents with  . Memory Loss    rm 7, new patient, husbandNicole Kindred, MMSE 26    HISTORY OF PRESENT ILLNESS:   64 year old right-handed female here for evaluation of memory loss. Patient underwent hysterectomy 2012 with prolonged hospital course of illness following this. Ever since that time she has had change in her cognitive abilities and memory abilities. This has continued to worsen over time. She has significant short-term memory loss, problems with numbers, dates, time. She tends to repeat herself and ask questions over and over especially regarding upcoming appointments. She has been driving loss and husband has taken over more. She still able to take care of her personal activities of daily living including dressing, bathing, meal preparation and small household chores.  Patient has family history of dementia in her mother and father.   REVIEW OF SYSTEMS: Full 14 system review of systems performed and notable only for memory loss confusion dizziness increased thirst during sensation.  ALLERGIES: Allergies  Allergen Reactions  . Flomax [Tamsulosin Hcl] Swelling    JAWS  . Sulfa Antibiotics Swelling    HOME MEDICATIONS: Outpatient Prescriptions Prior to Visit  Medication Sig Dispense Refill  . atorvastatin (LIPITOR) 20 MG tablet Take 20 mg by mouth at bedtime.    . Calcium Carbonate-Vitamin D (CALCIUM + D PO) Take 1 tablet by mouth daily.    Marland Kitchen MAGNESIUM PO Take 1 tablet by mouth daily.    . metFORMIN (GLUCOPHAGE) 500 MG tablet Take 500 mg by mouth 1 day or 1 dose.    . mirtazapine (REMERON) 30 MG tablet Take 30 mg by mouth at bedtime.    . cephALEXin (KEFLEX) 500 MG capsule Take 1 capsule (500 mg total) by mouth 4 (four) times daily. 10 capsule 0  .  esomeprazole (NEXIUM) 40 MG capsule Take 40 mg by mouth daily before breakfast.    . estrogens, conjugated, (PREMARIN) 0.9 MG tablet Take 0.9 mg by mouth daily.    Marland Kitchen oxybutynin (DITROPAN) 5 MG tablet Take 1 tablet (5 mg total) by mouth 3 (three) times daily. 30 tablet 1   No facility-administered medications prior to visit.    PAST MEDICAL HISTORY: Past Medical History  Diagnosis Date  . Hyperlipidemia   . GERD (gastroesophageal reflux disease)   . Depression   . Headache(784.0)   . Arthritis     fingers  . Hydronephrosis, right   . Renal calculi     LEFT  . History of kidney stones   . Type 2 diabetes mellitus ([place])   . History of hypertension     NO MEDS NOW  . Urge urinary incontinence   . Wears glasses     PAST SURGICAL HISTORY: Past Surgical History  Procedure Laterality Date  . Laparoscopic assisted vaginal hysterectomy  07/13/2011    Procedure: LAPAROSCOPIC ASSISTED VAGINAL HYSTERECTOMY;  Surgeon: Arloa Koh;  Location: [facility];  Service: Gynecology;  Laterality: N/A;  . Salpingoophorectomy  07/13/2011    Procedure: SALPINGO OOPHERECTOMY;  Surgeon: Arloa Koh;  Location: [facility];  Service: Gynecology;  Laterality: Bilateral;  . Anterior and posterior repair  07/13/2011    Procedure: ANTERIOR (CYSTOCELE) AND POSTERIOR REPAIR (RECTOCELE);  Surgeon: Arloa Koh;  Location: [facility];  Service: Gynecology;  Laterality: N/A;  . Bladder  suspension  07/13/2011    Procedure: TRANSVAGINAL TAPE (TVT) PROCEDURE;  Surgeon: Arloa Koh;  Location: [facility];  Service: Gynecology;  Laterality: N/A;  . Cystoscopy  07/13/2011    Procedure: CYSTOSCOPY;  Surgeon: Arloa Koh;  Location: [facility];  Service: Gynecology;  Laterality: N/A;  . Ercp  08/22/2011    Procedure: ENDOSCOPIC RETROGRADE CHOLANGIOPANCREATOGRAPHY (ERCP);  Surgeon: Missy Sabins, MD;  Location: [facility] END[facility]COPY;  Service: Endoscopy;  Laterality: N/A;  . Biliary stent placement  08/22/2011    Procedure: BILIARY STENT  PLACEMENT;  Surgeon: Missy Sabins, MD;  Location: [facility];  Service: Endoscopy;  Laterality: N/A;  98f x 5cm stent for bile leak. placed in CBD  . Ercp  12/15/2011    Procedure: ENDOSCOPIC RETROGRADE CHOLANGIOPANCREATOGRAPHY (ERCP);  Surgeon: Missy Sabins, MD;  Location: [facility];  Service: Endoscopy;  Laterality: N/A;  stent removal/mod. sedation  . Cysto/ ureteroscopic laser litho of stones  2009  &  09-30-2010  . Cysto/ right ureteral balloon dilation/ right ureteroscopic stone extractio/ stent  11-29-2010  . Extracorporeal shock wave lithotripsy Left 04-17-2011  . Cysto/ retrograde pyelogram/ right ureteral dilation  10-28-2008  . Laparoscopic cholecystectomy  08-17-2011  . Cystoscopy/retrograde/ureteroscopy/stone extraction with basket Bilateral 05/05/2013    Procedure: CYSTOSCOPY/RETROGRADE/URETEROSCOPY/STONE EXTRACTION WITH BASKET   Procedure:Cysto, Right Retrograde Pyelogram, Left Ureteroscopy with Holmium Laser, Extraction of Stones;  Surgeon: Franchot Gallo, MD;  Location: [facility];  Service: Urology;  Laterality: Bilateral;  . Holmium laser application Left [id number]    Procedure: HOLMIUM LASER APPLICATION;  Surgeon: Franchot Gallo, MD;  Location: [facility];  Service: Urology;  Laterality: Left;    FAMILY HISTORY: Family History  Problem Relation Age of Onset  . Anesthesia problems Neg Hx   . Hypotension Neg Hx   . Malignant hyperthermia Neg Hx   . Pseudochol deficiency Neg Hx   . Dementia Father     SOCIAL HISTORY:  Social History   Social History  . Marital Status: Married    Spouse Name: Nicole Kindred  . Number of Children: 0  . Years of Education: 14   Occupational History  .      retired   Social History Main Topics  . Smoking status: Never Smoker   . Smokeless tobacco: Never Used  . Alcohol Use: No  . Drug Use: No  . Sexual Activity: Not on file   Other Topics Concern  . Not on file   Social History Narrative    [place]   Caffeine use- tea, soda- 4 glasses daily     PHYSICAL EXAM  GENERAL EXAM/CONSTITUTIONAL: Vitals:  Filed Vitals:   08/25/15 1056  BP: 125/77  Pulse: 79  Height: 5\' 5"  (1.651 m)  Weight: 148 lb (67.132 kg)     Body mass index is 24.63 kg/(m^2).  Visual Acuity Screening   Right eye Left eye Both eyes  Without correction:     With correction: 20/50 20/50      Patient is in no distress; well developed, nourished and groomed; neck is supple  CARDIOVASCULAR:  Examination of carotid arteries is normal; no carotid bruits  Regular rate and rhythm, no murmurs  Examination of peripheral vascular system by observation and palpation is normal  EYES:  Ophthalmoscopic exam of optic discs and posterior segments is normal; no papilledema or hemorrhages  MUSCULOSKELETAL:  Gait, strength, tone, movements noted in Neurologic exam below  NEUROLOGIC: MENTAL STATUS:  MMSE - Mini Mental  State Exam 08/25/2015  Orientation to time 4  Orientation to Place 3  Registration 3  Attention/ Calculation 5  Recall 2  Language- name 2 objects 2  Language- repeat 1  Language- follow 3 step command 3  Language- read & follow direction 1  Write a sentence 1  Copy design 1  Total score 26    awake, alert, oriented to person, place and time  recent and remote memory intact  normal attention and concentration  language fluent, comprehension intact, naming intact,   fund of knowledge appropriate  CRANIAL NERVE:   2nd - no papilledema on fundoscopic exam  2nd, 3rd, 4th, 6th - pupils equal and reactive to light, visual fields full to confrontation, extraocular muscles intact, no nystagmus  5th - facial sensation symmetric  7th - facial strength symmetric  8th - hearing intact  9th - palate elevates symmetrically, uvula midline  11th - shoulder shrug symmetric  12th - tongue protrusion midline  MOTOR:   normal bulk and tone, full strength in  the BUE, BLE  SENSORY:   normal and symmetric to light touch, pinprick, temperature, vibration   COORDINATION:   finger-nose-finger, fine finger movements normal  REFLEXES:   deep tendon reflexes present and symmetric  GAIT/STATION:   narrow based gait; romberg is negative    DIAGNOSTIC DATA (LABS, IMAGING, TESTING) - I reviewed patient records, labs, notes, testing and imaging myself where available.  Lab Results  Component Value Date   WBC 8.3 09/12/2011   HGB 12.9 05/05/2013   HCT 38.0 05/05/2013   MCV 81.2 09/12/2011   PLT 211 09/12/2011      Component Value Date/Time   NA 144 05/05/2013 0808   K 3.4* 05/05/2013 0808   CL 98 09/16/2011 0500   CO2 19 09/16/2011 0500   GLUCOSE 92 05/05/2013 0808   BUN 5* 09/16/2011 0500   CREATININE 0.58 09/16/2011 0500   CALCIUM 9.8 09/16/2011 0500   PROT 7.0 09/12/2011 1002   ALBUMIN 3.3* 09/12/2011 1002   AST 41* 09/12/2011 1002   ALT 25 09/12/2011 1002   ALKPHOS 161* 09/12/2011 1002   BILITOT 0.4 09/12/2011 1002   GFRNONAA >90 09/16/2011 0500   GFRAA >90 09/16/2011 0500   Lab Results  Component Value Date   CHOL 98 08/21/2011   HDL 46 08/21/2011   LDLCALC 39 08/21/2011   TRIG 63 08/21/2011   CHOLHDL 2.1 08/21/2011   Lab Results  Component Value Date   HGBA1C 6.5* 09/12/2011   No results found for: VITAMINB12 Lab Results  Component Value Date   TSH 3.191 08/21/2011    09/13/11 CT head [I reviewed images myself and agree with interpretation. -VRP]  - normal      ASSESSMENT AND PLAN  64 y.o. year old female here with mild progressive memory loss since 2012, following hysterectomy surgery with prolonged post-op illness. 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   Ddx: mild dementia vs mild cognitive impairment vs metabolic vs CNS structural/inflamm  Memory loss    PLAN: - consider CREAD research study - then may consider MRI brain,  memantine, donepezil - safety and supervision and advanced care planning reviewed  Return in about 6 weeks (around 10/06/2015).  I reviewed images, labs, notes, records myself. I summarized findings and reviewed with patient requiring high complexity decision making.    Penni Bombard, MD [id number], 0000000 AM Certified in Neurology, Neurophysiology and Neuroimaging  [facility] [address]  [place], [place] [address] [phone]

## 2015-08-25 NOTE — Patient Instructions (Signed)
Thank you for coming to see [place] at [facility]. I hope we have been able to provide you high quality care today.  You may receive a patient satisfaction survey over the next few weeks. We would appreciate your feedback and comments so that we may continue to improve ourselves and the health of our patients.  - consider CREAD research study - then may consider MRI brain, donepezil or memantine medications    ~~~~~~~~~~~~~~~~~~~~~~~~~~~~~~~~~~~~~~~~~~~~~~~~~~~~~~~~~~~~~~~~~  DR. Ryatt Corsino'S GUIDE TO HAPPY AND HEALTHY LIVING These are some of my general health and wellness recommendations. Some of them may apply to you better than others. Please use common sense as you try these suggestions and feel free to ask me any questions.   ACTIVITY/FITNESS Mental, social, emotional and physical stimulation are very important for brain and body health. Try learning a new activity (arts, music, language, sports, games).  Keep moving your body to the best of your abilities. You can do this at home, inside or outside, the park, community center, gym or anywhere you like. Consider a physical therapist or personal trainer to get started. Consider the app Sworkit. Fitness trackers such as smart-watches, smart-phones or Fitbits can help as well.   NUTRITION Eat more plants: colorful vegetables, nuts, seeds and berries.  Eat less sugar, salt, preservatives and processed foods.  Avoid toxins such as cigarettes and alcohol.  Drink water when you are thirsty. Warm water with a slice of lemon is an excellent morning drink to start the day.  Consider these websites for more information The Nutrition Source (https://www.henry-hernandez.biz/) Precision Nutrition (WindowBlog.ch)   RELAXATION Consider practicing mindfulness meditation or other relaxation techniques such as deep breathing, prayer, yoga, tai chi, massage. See website mindful.org or the  apps Headspace or Calm to help get started.   SLEEP Try to get at least 7-8+ hours sleep per day. Regular exercise and reduced caffeine will help you sleep better. Practice good sleep hygeine techniques. See website sleep.org for more information.   PLANNING Prepare estate planning, living will, healthcare POA documents. Sometimes this is best planned with the help of an attorney. Theconversationproject.org and agingwithdignity.org are excellent resources.

## 2015-09-01 ENCOUNTER — Telehealth: Payer: Self-pay

## 2015-09-01 NOTE — Telephone Encounter (Signed)
I spoke to the patient's spouse, Raelea Segner, in regards to the CREAD study. Nicole Kindred stated that the patient and her family reviewed the Informed Consent Form (ICF), and they are not interested in participating. I thanked him for his time and consideration.

## 2015-10-14 ENCOUNTER — Ambulatory Visit: Payer: BC Managed Care – PPO | Admitting: Diagnostic Neuroimaging

## 2015-10-18 ENCOUNTER — Ambulatory Visit (INDEPENDENT_AMBULATORY_CARE_PROVIDER_SITE_OTHER): Payer: BC Managed Care – PPO | Admitting: Diagnostic Neuroimaging

## 2015-10-18 ENCOUNTER — Encounter: Payer: Self-pay | Admitting: Diagnostic Neuroimaging

## 2015-10-18 VITALS — BP 119/70 | HR 76 | Ht 65.0 in | Wt 144.0 lb

## 2015-10-18 DIAGNOSIS — R413 Other amnesia: Secondary | ICD-10-CM | POA: Diagnosis not present

## 2015-10-18 NOTE — Progress Notes (Signed)
[facility]  PATIENT: [name] DOB: 1951-08-06  REFERRING CLINICIAN: Howard  HISTORY FROM: patient and husband REASON FOR VISIT: follow up   HISTORICAL  CHIEF COMPLAINT:  Chief Complaint  Patient presents with  . Memory Loss    rm 7, husband Nicole Kindred, MMSE 27  . Follow-up    6 weeks    HISTORY OF PRESENT ILLNESS:   UPDATE 10/18/15: Since last visit, symptoms stable. Pt and family decided to hold off on research study. Overall, no new issues.  PRIOR HPI (08/25/15): 65 year old right-handed female here for evaluation of memory loss. Patient underwent hysterectomy 2012 with prolonged hospital course of illness following this. Ever since that time she has had change in her cognitive abilities and memory abilities. This has continued to worsen over time. She has significant short-term memory loss, problems with numbers, dates, time. She tends to repeat herself and ask questions over and over especially regarding upcoming appointments. She has been driving loss and husband has taken over more. She still able to take care of her personal activities of daily living including dressing, bathing, meal preparation and small household chores. Patient has family history of dementia in her mother and father.   REVIEW OF SYSTEMS: Full 14 system review of systems performed and notable only for confusion anxiety black stools.    ALLERGIES: Allergies  Allergen Reactions  . Flomax [Tamsulosin Hcl] Swelling    JAWS  . Sulfa Antibiotics Swelling    HOME MEDICATIONS: Outpatient Prescriptions Prior to Visit  Medication Sig Dispense Refill  . Ascorbic Acid (VITAMIN C) 100 MG tablet Take 100 mg by mouth daily. Dose unknown    . atorvastatin (LIPITOR) 20 MG tablet Take 20 mg by mouth at bedtime.    . Calcium Carbonate-Vitamin D (CALCIUM + D PO) Take 1 tablet by mouth daily.    Marland Kitchen estradiol (ESTRACE) 0.5 MG tablet Take 0.5 mg by mouth daily.    . Iron-Vitamin C (IRON 100/C PO)  Take by mouth. Dose unknown    . MAGNESIUM PO Take 1 tablet by mouth daily.    . metFORMIN (GLUCOPHAGE) 500 MG tablet Take 500 mg by mouth 1 day or 1 dose.    . mirtazapine (REMERON) 30 MG tablet Take 30 mg by mouth at bedtime.    Marland Kitchen omeprazole (PRILOSEC) 20 MG capsule Take 20 mg by mouth 2 (two) times daily before a meal.     No facility-administered medications prior to visit.    PAST MEDICAL HISTORY: Past Medical History  Diagnosis Date  . Hyperlipidemia   . GERD (gastroesophageal reflux disease)   . Depression   . Headache(784.0)   . Arthritis     fingers  . Hydronephrosis, right   . Renal calculi     LEFT  . History of kidney stones   . Type 2 diabetes mellitus ([place])   . History of hypertension     NO MEDS NOW  . Urge urinary incontinence   . Wears glasses     PAST SURGICAL HISTORY: Past Surgical History  Procedure Laterality Date  . Laparoscopic assisted vaginal hysterectomy  07/13/2011    Procedure: LAPAROSCOPIC ASSISTED VAGINAL HYSTERECTOMY;  Surgeon: Arloa Koh;  Location: [facility];  Service: Gynecology;  Laterality: N/A;  . Salpingoophorectomy  07/13/2011    Procedure: SALPINGO OOPHERECTOMY;  Surgeon: Arloa Koh;  Location: [facility];  Service: Gynecology;  Laterality: Bilateral;  . Anterior and posterior repair  07/13/2011    Procedure: ANTERIOR (CYSTOCELE) AND POSTERIOR  REPAIR (RECTOCELE);  Surgeon: Arloa Koh;  Location: [facility];  Service: Gynecology;  Laterality: N/A;  . Bladder suspension  07/13/2011    Procedure: TRANSVAGINAL TAPE (TVT) PROCEDURE;  Surgeon: Arloa Koh;  Location: [facility];  Service: Gynecology;  Laterality: N/A;  . Cystoscopy  07/13/2011    Procedure: CYSTOSCOPY;  Surgeon: Arloa Koh;  Location: [facility];  Service: Gynecology;  Laterality: N/A;  . Ercp  08/22/2011    Procedure: ENDOSCOPIC RETROGRADE CHOLANGIOPANCREATOGRAPHY (ERCP);  Surgeon: Missy Sabins, MD;  Location: [facility];  Service: Endoscopy;  Laterality: N/A;  . Biliary  stent placement  08/22/2011    Procedure: BILIARY STENT PLACEMENT;  Surgeon: Missy Sabins, MD;  Location: [place];  Service: Endoscopy;  Laterality: N/A;  15f x 5cm stent for bile leak. placed in CBD  . Ercp  12/15/2011    Procedure: ENDOSCOPIC RETROGRADE CHOLANGIOPANCREATOGRAPHY (ERCP);  Surgeon: Missy Sabins, MD;  Location: [facility];  Service: Endoscopy;  Laterality: N/A;  stent removal/mod. sedation  . Cysto/ ureteroscopic laser litho of stones  2009  &  09-30-2010  . Cysto/ right ureteral balloon dilation/ right ureteroscopic stone extractio/ stent  11-29-2010  . Extracorporeal shock wave lithotripsy Left 04-17-2011  . Cysto/ retrograde pyelogram/ right ureteral dilation  10-28-2008  . Laparoscopic cholecystectomy  08-17-2011  . Cystoscopy/retrograde/ureteroscopy/stone extraction with basket Bilateral 05/05/2013    Procedure: CYSTOSCOPY/RETROGRADE/URETEROSCOPY/STONE EXTRACTION WITH BASKET   Procedure:Cysto, Right Retrograde Pyelogram, Left Ureteroscopy with Holmium Laser, Extraction of Stones;  Surgeon: Franchot Gallo, MD;  Location: [facility];  Service: Urology;  Laterality: Bilateral;  . Holmium laser application Left [id number]    Procedure: HOLMIUM LASER APPLICATION;  Surgeon: Franchot Gallo, MD;  Location: [facility];  Service: Urology;  Laterality: Left;    FAMILY HISTORY: Family History  Problem Relation Age of Onset  . Anesthesia problems Neg Hx   . Hypotension Neg Hx   . Malignant hyperthermia Neg Hx   . Pseudochol deficiency Neg Hx   . Dementia Father     SOCIAL HISTORY:  Social History   Social History  . Marital Status: Married    Spouse Name: Nicole Kindred  . Number of Children: 0  . Years of Education: 14   Occupational History  .      retired   Social History Main Topics  . Smoking status: Never Smoker   . Smokeless tobacco: Never Used  . Alcohol Use: No  . Drug Use: No  . Sexual Activity: Not on file   Other Topics  Concern  . Not on file   Social History Narrative   [place]ives at [place] with [place]   Caffeine use- tea, soda- 4 glasses daily     PHYSICAL EXAM  GENERAL EXAM/CONSTITUTIONAL: Vitals:  Filed Vitals:   10/18/15 1247  BP: 119/70  Pulse: 76  Height: 5\' 5"  (1.651 m)  Weight: 144 lb (65.318 kg)   Body mass index is 23.96 kg/(m^2). No exam data present  Patient is in no distress; well developed, nourished and groomed; neck is supple  CARDIOVASCULAR:  Examination of carotid arteries is normal; no carotid bruits  Regular rate and rhythm, no murmurs  Examination of peripheral vascular system by observation and palpation is normal  EYES:  Ophthalmoscopic exam of optic discs and posterior segments is normal; no papilledema or hemorrhages  MUSCULOSKELETAL:  Gait, strength, tone, movements noted in Neurologic exam below  NEUROLOGIC: MENTAL STATUS:  MMSE - Mini Mental State Exam 10/18/2015 08/25/2015  Orientation to time 3 4  Orientation to Place 5 3  Registration 3 3  Attention/ Calculation 5 5  Recall 2 2  Language- name 2 objects 2 2  Language- repeat 1 1  Language- follow 3 step command 3 3  Language- read & follow direction 1 1  Write a sentence 1 1  Copy design 1 1  Total score 27 26    awake, alert, oriented to person, place and time  recent and remote memory intact  normal attention and concentration  language fluent, comprehension intact, naming intact,   fund of knowledge appropriate  CRANIAL NERVE:   2nd, 3rd, 4th, 6th - pupils equal and reactive to light, visual fields full to confrontation, extraocular muscles intact, no nystagmus  5th - facial sensation symmetric  7th - facial strength symmetric  8th - hearing intact  9th - palate elevates symmetrically, uvula midline  11th - shoulder shrug symmetric  12th - tongue protrusion midline  MOTOR:   normal bulk and tone, full strength in the BUE, BLE  SENSORY:   normal and symmetric to  light touch, temperature, vibration   COORDINATION:   finger-nose-finger, fine finger movements normal  REFLEXES:   deep tendon reflexes present and symmetric  GAIT/STATION:   narrow based gait; romberg is negative    DIAGNOSTIC DATA (LABS, IMAGING, TESTING) - I reviewed patient records, labs, notes, testing and imaging myself where available.  Lab Results  Component Value Date   WBC 8.3 09/12/2011   HGB 12.9 05/05/2013   HCT 38.0 05/05/2013   MCV 81.2 09/12/2011   PLT 211 09/12/2011      Component Value Date/Time   NA 144 05/05/2013 0808   K 3.4* 05/05/2013 0808   CL 98 09/16/2011 0500   CO2 19 09/16/2011 0500   GLUCOSE 92 05/05/2013 0808   BUN 5* 09/16/2011 0500   CREATININE 0.58 09/16/2011 0500   CALCIUM 9.8 09/16/2011 0500   PROT 7.0 09/12/2011 1002   ALBUMIN 3.3* 09/12/2011 1002   AST 41* 09/12/2011 1002   ALT 25 09/12/2011 1002   ALKPHOS 161* 09/12/2011 1002   BILITOT 0.4 09/12/2011 1002   GFRNONAA >90 09/16/2011 0500   GFRAA >90 09/16/2011 0500   Lab Results  Component Value Date   CHOL 98 08/21/2011   HDL 46 08/21/2011   LDLCALC 39 08/21/2011   TRIG 63 08/21/2011   CHOLHDL 2.1 08/21/2011   Lab Results  Component Value Date   HGBA1C 6.5* 09/12/2011   No results found for: VITAMINB12 Lab Results  Component Value Date   TSH 3.191 08/21/2011    09/13/11 CT head [I reviewed images myself and agree with interpretation. -VRP]  - normal   10/06/11 MRI brain  - abnormal T2 and diffusion signal in the medial thalami. The differential includes ischemic change from arterial (artery of Percheron) or deep venous compromise. Other etiologies include viral or other infectious encephalitides, including CJD, as well as metabolic conditions, such as Wernicke's encephalopathy. Primary neoplasm or metastatic disease is unlikely.  10/06/11 MRA head  - unremarkable MR angiographic appearance of the major intracranial arteries.   10/06/11 MRV head - No MR  venographic evidence of dural sinus thrombosis.     ASSESSMENT AND PLAN  65 y.o. year old female here with mild progressive memory loss since 2012, following hysterectomy surgery with prolonged post-op illness. MRI brain in 2013 showed possible wernicke's encephalopathy changes, possibly triggered by stress of surgery/critical illness and poor nutrition. Overall symptoms stable since 2013.  Ddx: mild cognitive impairment vs metabolic (wernicke's encephalopathy) vs CNS structural/inflamm vs mild dementia  Memory loss    PLAN: - monitor symptoms - safety and supervision and advanced care planning reviewed  Return if symptoms worsen or fail to improve, for return to PCP.    Penni Bombard, MD [id number], [id number] PM Certified in Neurology, Neurophysiology and Neuroimaging  [facility] [address] [place], [place] [address] [phone]

## 2015-10-18 NOTE — Patient Instructions (Signed)

## 2016-01-13 ENCOUNTER — Other Ambulatory Visit (HOSPITAL_COMMUNITY): Payer: Self-pay | Admitting: Obstetrics & Gynecology

## 2016-01-13 DIAGNOSIS — R928 Other abnormal and inconclusive findings on diagnostic imaging of breast: Secondary | ICD-10-CM

## 2016-01-13 DIAGNOSIS — N63 Unspecified lump in unspecified breast: Secondary | ICD-10-CM

## 2016-01-25 ENCOUNTER — Ambulatory Visit (HOSPITAL_COMMUNITY)
Admission: RE | Admit: 2016-01-25 | Discharge: 2016-01-25 | Disposition: A | Discharge status: Home / Self Care | Payer: BC Managed Care – PPO | Source: Ambulatory Visit | Attending: Obstetrics & Gynecology | Admitting: Obstetrics & Gynecology

## 2016-01-25 DIAGNOSIS — N63 Unspecified lump in unspecified breast: Secondary | ICD-10-CM

## 2016-01-25 DIAGNOSIS — R928 Other abnormal and inconclusive findings on diagnostic imaging of breast: Secondary | ICD-10-CM | POA: Diagnosis not present

## 2018-10-15 ENCOUNTER — Institutional Professional Consult (permissible substitution): Payer: Medicare Other | Admitting: Diagnostic Neuroimaging

## 2019-12-01 ENCOUNTER — Emergency Department (HOSPITAL_COMMUNITY)
Admission: EM | Admit: 2019-12-01 | Discharge: 2019-12-01 | Disposition: A | Discharge status: Nursing Home (Includes ICF) | Payer: Medicare PPO | Attending: Emergency Medicine | Admitting: Emergency Medicine

## 2019-12-01 ENCOUNTER — Other Ambulatory Visit: Payer: Self-pay

## 2019-12-01 ENCOUNTER — Encounter (HOSPITAL_COMMUNITY): Payer: Self-pay

## 2019-12-01 ENCOUNTER — Emergency Department (HOSPITAL_COMMUNITY): Payer: Medicare PPO

## 2019-12-01 DIAGNOSIS — X58XXXA Exposure to other specified factors, initial encounter: Secondary | ICD-10-CM | POA: Insufficient documentation

## 2019-12-01 DIAGNOSIS — E119 Type 2 diabetes mellitus without complications: Secondary | ICD-10-CM | POA: Insufficient documentation

## 2019-12-01 DIAGNOSIS — Y999 Unspecified external cause status: Secondary | ICD-10-CM | POA: Insufficient documentation

## 2019-12-01 DIAGNOSIS — S52591A Other fractures of lower end of right radius, initial encounter for closed fracture: Secondary | ICD-10-CM | POA: Diagnosis not present

## 2019-12-01 DIAGNOSIS — Z7984 Long term (current) use of oral hypoglycemic drugs: Secondary | ICD-10-CM | POA: Insufficient documentation

## 2019-12-01 DIAGNOSIS — Y92129 Unspecified place in nursing home as the place of occurrence of the external cause: Secondary | ICD-10-CM | POA: Diagnosis not present

## 2019-12-01 DIAGNOSIS — S6991XA Unspecified injury of right wrist, hand and finger(s), initial encounter: Secondary | ICD-10-CM | POA: Diagnosis present

## 2019-12-01 DIAGNOSIS — Y9389 Activity, other specified: Secondary | ICD-10-CM | POA: Diagnosis not present

## 2019-12-01 DIAGNOSIS — S52551A Other extraarticular fracture of lower end of right radius, initial encounter for closed fracture: Secondary | ICD-10-CM

## 2019-12-01 NOTE — ED Notes (Signed)
Report given to [employer] (med tech) at [facility]. PT's sister [name] is transporting patient back to facility at this time.

## 2019-12-01 NOTE — ED Triage Notes (Signed)
Pt brought to ED via [facility] from [facility] for acute moderately displaced fracture of distal diaphysis of the right radius. Report at bedside.

## 2019-12-01 NOTE — ED Provider Notes (Signed)
[facility] EMERGENCY DEPARTMENT Provider Note   CSN: [id number] Arrival date & time: 12/01/19  1553     History Chief Complaint  Patient presents with  . Right Wrist Fracture    [name] is a 70 y.o. female with a history of arthritis, GERD, hypertension type 2 diabetes and dementia presenting from a local nursing home with a known fracture to her right distal radius.  [name] presents with a report from an outside x-ray reflecting this diagnosis with moderate displacement of the distal fragment.  Her films are not available.  [name] has mild pain at this time and appears fairly comfortable.  [name] declines pain medication currently.  [name] denies any other injuries.  [name] is unable to describe the nature of the injury, [name] does not recall falling.  However, [name] does have dementia.  6:04 PM Call placed to patient's nursing facility and spoke with [place], [place].  [name] confirms that her injury was not witnessed but 2 days ago [name] was noted to have swelling at the site, also had redness.  After sending a picture to a medical provider [name] was placed on antibiotics for treatment of presumptive cellulitis.  The following day however it started to bruise, therefore x-ray imaging was obtained documenting fracture.  Level 5 caveat.  HPI     Past Medical History:  Diagnosis Date  . Arthritis    fingers  . Depression   . GERD (gastroesophageal reflux disease)   . Headache(784.0)   . History of hypertension    NO MEDS NOW  . History of kidney stones   . Hydronephrosis, right   . Hyperlipidemia   . Renal calculi    LEFT  . Type 2 diabetes mellitus ([place])   . Urge urinary incontinence   . Wears glasses     Patient Active Problem List   Diagnosis Date Noted  . Nausea and vomiting 09/12/2011  . Hypercalcemia 09/12/2011  . UTI (lower urinary tract infection) 09/12/2011  . Anastomotic leak of biliary tree 08/26/2011  . Diabetes mellitus 08/21/2011  . Hypokalemia 08/21/2011  . Tachycardia  08/21/2011  . Biliary sludge 08/21/2011  . Hyperlipidemia   . GERD (gastroesophageal reflux disease)   . Hypertension     Past Surgical History:  Procedure Laterality Date  . ANTERIOR AND POSTERIOR REPAIR  07/13/2011   Procedure: ANTERIOR (CYSTOCELE) AND POSTERIOR REPAIR (RECTOCELE);  Surgeon: Arloa Koh;  Location: [facility];  Service: Gynecology;  Laterality: N/A;  . BILIARY STENT PLACEMENT  08/22/2011   Procedure: BILIARY STENT PLACEMENT;  Surgeon: Missy Sabins, MD;  Location: [place];  Service: Endoscopy;  Laterality: N/A;  60f x 5cm stent for bile leak. placed in CBD  . BLADDER SUSPENSION  07/13/2011   Procedure: TRANSVAGINAL TAPE (TVT) PROCEDURE;  Surgeon: Arloa Koh;  Location: [facility];  Service: Gynecology;  Laterality: N/A;  . CYSTO/ RETROGRADE PYELOGRAM/ RIGHT URETERAL DILATION  10-28-2008  . CYSTO/ RIGHT URETERAL BALLOON DILATION/ RIGHT URETEROSCOPIC STONE EXTRACTIO/ STENT  11-29-2010  . CYSTO/ URETEROSCOPIC LASER LITHO OF STONES  2009  &  09-30-2010  . CYSTOSCOPY  07/13/2011   Procedure: CYSTOSCOPY;  Surgeon: Arloa Koh;  Location: [facility];  Service: Gynecology;  Laterality: N/A;  . CYSTOSCOPY/RETROGRADE/URETEROSCOPY/STONE EXTRACTION WITH BASKET Bilateral 05/05/2013   Procedure: CYSTOSCOPY/RETROGRADE/URETEROSCOPY/STONE EXTRACTION WITH BASKET   Procedure:Cysto, Right Retrograde Pyelogram, Left Ureteroscopy with Holmium Laser, Extraction of Stones;  Surgeon: Franchot Gallo, MD;  Location: [facility];  Service: Urology;  Laterality: Bilateral;  .  ERCP  08/22/2011   Procedure: ENDOSCOPIC RETROGRADE CHOLANGIOPANCREATOGRAPHY (ERCP);  Surgeon: Missy Sabins, MD;  Location: [facility];  Service: Endoscopy;  Laterality: N/A;  . ERCP  12/15/2011   Procedure: ENDOSCOPIC RETROGRADE CHOLANGIOPANCREATOGRAPHY (ERCP);  Surgeon: Missy Sabins, MD;  Location: [facility];  Service: Endoscopy;  Laterality: N/A;  stent removal/mod. sedation  . EXTRACORPOREAL SHOCK WAVE  LITHOTRIPSY Left 04-17-2011  . HOLMIUM LASER APPLICATION Left [id number]   Procedure: HOLMIUM LASER APPLICATION;  Surgeon: Franchot Gallo, MD;  Location: [facility];  Service: Urology;  Laterality: Left;  . LAPAROSCOPIC ASSISTED VAGINAL HYSTERECTOMY  07/13/2011   Procedure: LAPAROSCOPIC ASSISTED VAGINAL HYSTERECTOMY;  Surgeon: Arloa Koh;  Location: [facility];  Service: Gynecology;  Laterality: N/A;  . LAPAROSCOPIC CHOLECYSTECTOMY  08-17-2011  . SALPINGOOPHORECTOMY  07/13/2011   Procedure: SALPINGO OOPHERECTOMY;  Surgeon: Arloa Koh;  Location: [facility];  Service: Gynecology;  Laterality: Bilateral;     OB History   No obstetric history on file.     Family History  Problem Relation Age of Onset  . Dementia Father   . Anesthesia problems Neg Hx   . Hypotension Neg Hx   . Malignant hyperthermia Neg Hx   . Pseudochol deficiency Neg Hx     Social History   Tobacco Use  . Smoking status: Never Smoker  . Smokeless tobacco: Never Used  Substance Use Topics  . Alcohol use: No  . Drug use: No    Home Medications Prior to Admission medications   Medication Sig Start Date End Date Taking? Authorizing Provider  Ascorbic Acid (VITAMIN C) 100 MG tablet Take 100 mg by mouth daily. Dose unknown    [provider]  atorvastatin (LIPITOR) 20 MG tablet Take 20 mg by mouth at bedtime.    Verlee Monte, MD  Calcium Carbonate-Vitamin D (CALCIUM + D PO) Take 1 tablet by mouth daily.    [provider]  estradiol (ESTRACE) 0.5 MG tablet Take 0.5 mg by mouth daily.    [provider]  Iron-Vitamin C (IRON 100/C PO) Take by mouth. Dose unknown    [provider]  MAGNESIUM PO Take 1 tablet by mouth daily.    [provider]  metFORMIN (GLUCOPHAGE) 500 MG tablet Take 500 mg by mouth 1 day or 1 dose.    [provider]  mirtazapine (REMERON) 30 MG tablet Take 30 mg by mouth at bedtime.    [provider]  omeprazole  (PRILOSEC) 20 MG capsule Take 20 mg by mouth 2 (two) times daily before a meal.    [provider]    Allergies    Flomax [tamsulosin hcl] and Sulfa antibiotics  Review of Systems   Review of Systems  Unable to perform ROS: Dementia  Musculoskeletal: Positive for arthralgias and joint swelling. Negative for myalgias.    Physical Exam Updated Vital Signs BP 121/74   Pulse 66   Temp 98.6 F (37 C) (Oral)   Resp 18   Ht 5\' 1"  (1.549 m)   Wt 59 kg   SpO2 100%   BMI 24.56 kg/m   Physical Exam Constitutional:      Appearance: [name] is well-developed.  HENT:     Head: Atraumatic.  Cardiovascular:     Comments: Pulses equal bilaterally Musculoskeletal:        General: Swelling, tenderness and deformity present.     Cervical back: Normal range of motion.     Comments: Moderate swelling, bruising and tenderness to  palpation right distal dorsal wrist.  Finger nontender, elbow FROM nontender, no pain at the right shoulder or clavicle.  Radial pulse intact. Less than 2 sec cap refill in fingertips.   Skin:    General: Skin is warm and dry.  Neurological:     Mental Status: [name] is alert.     Sensory: No sensory deficit.     Deep Tendon Reflexes: Reflexes normal.     ED Results / Procedures / Treatments   Labs (all labs ordered are listed, but only abnormal results are displayed) Labs Reviewed - No data to display  EKG None  Radiology DG Wrist Complete Right  Result Date: 12/01/2019 CLINICAL DATA:  Fracture follow-up. EXAM: RIGHT WRIST - COMPLETE 3+ VIEW COMPARISON:  None. FINDINGS: Oblique fracture distal radius. This appears to be an acute fracture without significant bony healing. There is posterior displacement of the fracture. Fracture does not appear to extend into the wrist joint. There is joint space narrowing of the radiocarpal joint compatible with mild degenerative change. No other arthropathy or erosion. IMPRESSION: Dorsally displaced fracture distal radius  which does not appear to extend into the wrist joint. Electronically Signed   By: Franchot Gallo M.D.   On: 12/01/2019 16:53    Procedures Procedures (including critical care time)  SPLINT APPLICATION Date/Time: 123456 PM Authorized by: Evalee Jefferson Consent: Verbal consent obtained. Risks and benefits: risks, benefits and alternatives were discussed Consent given by: patient Splint applied by: ED tech Location details: right forearm Splint type: sugar tong Supplies used: webril, ace, orthoglass.  sling Post-procedure: The splinted body part was neurovascularly unchanged following the procedure. Patient tolerance: Patient tolerated the procedure well with no immediate complications.     Medications Ordered in ED Medications - No data to display  ED Course  I have reviewed the triage vital signs and the nursing notes.  Pertinent labs & imaging results that were available during my care of the patient were reviewed by me and considered in my medical decision making (see chart for details).    MDM Rules/Calculators/A&P                      Imaging reviewed and discussed with patient and her sister who is now at the bedside.  [name] has a dorsally displaced distal radius fracture which does not involve the wrist joint.  [name] has no vascular compromise and is fairly comfortable with this injury.  [name] was placed in a sugar tong splint and a sling was provided.  Spoke with Dr. Jeannie Fend regarding this injury.  He will follow up with her as an outpatient, has requested that they call his office for an appointment time on Thursday.  Final Clinical Impression(s) / ED Diagnoses Final diagnoses:  Other closed extra-articular fracture of distal end of right radius, initial encounter    Rx / DC Orders ED Discharge Orders    None       Landis Martins 12/01/19 1806    Milton Ferguson, MD 12/03/19 1154

## 2019-12-01 NOTE — Discharge Instructions (Addendum)
Keep your splint clean and dry.  You may take tylenol if needed for pain relief.  Ice will also help with pain and swelling.

## 2019-12-03 ENCOUNTER — Ambulatory Visit (INDEPENDENT_AMBULATORY_CARE_PROVIDER_SITE_OTHER): Payer: Medicare PPO | Admitting: Orthopedic Surgery

## 2019-12-03 ENCOUNTER — Other Ambulatory Visit: Payer: Self-pay

## 2019-12-03 VITALS — Temp 97.0°F

## 2019-12-03 DIAGNOSIS — W19XXXA Unspecified fall, initial encounter: Secondary | ICD-10-CM | POA: Diagnosis not present

## 2019-12-03 DIAGNOSIS — S52501A Unspecified fracture of the lower end of right radius, initial encounter for closed fracture: Secondary | ICD-10-CM | POA: Diagnosis not present

## 2019-12-03 NOTE — Progress Notes (Signed)
Chief Complaint  Patient presents with  . Wrist Injury    Right wrist frature, DOI 12-01-19? not known.    History 69 year old female with dementia unwitnessed fall injured wrist April 5 splinted in ER comes in for treatment  No history available  Attendant from the nursing home is with her but can offer no other history  We did review the ER records and they confirm unwitnessed fall fracture and splinting  Review of Systems  All other systems reviewed and are negative.  Past Medical History:  Diagnosis Date  . Arthritis    fingers  . Depression   . GERD (gastroesophageal reflux disease)   . Headache(784.0)   . History of hypertension    NO MEDS NOW  . History of kidney stones   . Hydronephrosis, right   . Hyperlipidemia   . Renal calculi    LEFT  . Type 2 diabetes mellitus ([place])   . Urge urinary incontinence   . Wears glasses     Temp (!) 97 F (36.1 C)   No other vitals could be obtained due to patient's mental deficiencies.  She has a swollen right upper extremity from proximal forearm to wrist capillary refill is normal color is normal she does respond to pain and it seems like her sensation is intact pulses are good she is tender over the distal radius.  Elbows nontender shoulders nontender the humerus is nontender the collarbone is nontender  The x-ray which was done at the hospital she has a distal radius fracture it is oblique it is minimally angulated is minimally displaced it lends the question of whether this is a Galeazzi fracture although there is no displacement of the ulna dorsally  Recommend short arm cast in this situation  Return in 6 weeks for x-ray and then recastAs it will probably not be healed and she will not tolerate splinting  Encounter Diagnosis  Name Primary?  . Closed fracture of distal end of right radius, unspecified fracture morphology, initial encounter Yes

## 2019-12-03 NOTE — Patient Instructions (Signed)
Cast or Splint Care, Adult Casts and splints are supports that are worn to protect broken bones and other injuries. A cast or splint may hold a bone still and in the correct position while it heals. Casts and splints may also help to ease pain, swelling, and muscle spasms. How to care for your cast   Do not stick anything inside the cast to scratch your skin.  Check the skin around the cast every day. Tell your doctor about any concerns.  You may put lotion on dry skin around the edges of the cast. Do not put lotion on the skin under the cast.  Keep the cast clean.  If the cast is not waterproof: ? Do not let it get wet. ? Cover it with a watertight covering when you take a bath or a shower. How to care for your splint   Wear it as told by your doctor. Take it off only as told by your doctor.  Loosen the splint if your fingers or toes tingle, get numb, or turn cold and blue.  Keep the splint clean.  If the splint is not waterproof: ? Do not let it get wet. ? Cover it with a watertight covering when you take a bath or a shower. Follow these instructions at home: Bathing  Do not take baths or swim until your doctor says it is okay. Ask your doctor if you can take showers. You may only be allowed to take sponge baths for bathing.  If your cast or splint is not waterproof, cover it with a watertight covering when you take a bath or shower. Managing pain, stiffness, and swelling  Move your fingers or toes often to avoid stiffness and to lessen swelling.  Raise (elevate) the injured area above the level of your heart while sitting or lying down. Safety  Do not use the injured limb to support your body weight until your doctor says that it is okay.  Use crutches or other assistive devices as told by your doctor. General instructions  Do not put pressure on any part of the cast or splint until it is fully hardened. This may take many hours.  Return to your normal activities as  told by your doctor. Ask your doctor what activities are safe for you.  Keep all follow-up visits as told by your doctor. This is important. Contact a doctor if:  Your cast or splint gets damaged.  The skin around the cast gets red or raw.  The skin under the cast is very itchy or painful.  Your cast or splint feels very uncomfortable.  Your cast or splint is too tight or too loose.  Your cast becomes wet or it starts to have a soft spot or area.  You get an object stuck under your cast. Get help right away if:  Your pain gets worse.  The injured area tingles, gets numb, or turns blue and cold.  The part of your body above or below the cast is swollen and it turns a different color (is discolored).  You cannot feel or move your fingers or toes.  There is fluid leaking through the cast.  You have very bad pain or pressure under the cast.  You have trouble breathing.  You have shortness of breath.  You have chest pain. This information is not intended to replace advice given to you by your health care provider. Make sure you discuss any questions you have with your health care provider. Document Revised:   12/04/2018 Document Reviewed: 08/04/2016 Elsevier Patient Education  2020 [employer] Inc[employer]  

## 2019-12-05 ENCOUNTER — Ambulatory Visit: Payer: Medicare PPO | Admitting: Orthopedic Surgery

## 2019-12-08 ENCOUNTER — Encounter: Payer: Self-pay | Admitting: Orthopedic Surgery

## 2019-12-08 ENCOUNTER — Ambulatory Visit (INDEPENDENT_AMBULATORY_CARE_PROVIDER_SITE_OTHER): Payer: Medicare PPO | Admitting: Orthopedic Surgery

## 2019-12-08 ENCOUNTER — Other Ambulatory Visit: Payer: Self-pay

## 2019-12-08 VITALS — Temp 97.5°F

## 2019-12-08 DIAGNOSIS — S52501D Unspecified fracture of the lower end of right radius, subsequent encounter for closed fracture with routine healing: Secondary | ICD-10-CM | POA: Diagnosis not present

## 2019-12-08 NOTE — Progress Notes (Signed)
69 year old female with fractured right distal radius took her cast off  Encounter Diagnosis  Name Primary?  . Closed fracture of distal end of right radius with routine healing, unspecified fracture morphology, subsequent encounter Yes    Chief Complaint  Patient presents with  . Arm Pain    R/took cast off/ DOI 11/29/19/bruised and swollen   Cast reapplied continue with regular follow-up as scheduled

## 2019-12-09 ENCOUNTER — Ambulatory Visit (INDEPENDENT_AMBULATORY_CARE_PROVIDER_SITE_OTHER): Payer: Medicare PPO | Admitting: Orthopedic Surgery

## 2019-12-09 DIAGNOSIS — S52501D Unspecified fracture of the lower end of right radius, subsequent encounter for closed fracture with routine healing: Secondary | ICD-10-CM

## 2019-12-09 NOTE — Progress Notes (Signed)
Chief Complaint  Patient presents with  . Wrist Injury    12/01/19? right wrist fracture / escaped from cast again     69 year old female with a right wrist and forearm fracture has taken off several cast.  She has dementia.  She is at a nursing home.  We are going to try a long-arm cast which was applied  Keep appointment as stated for x-ray out of plaster  Encounter Diagnosis  Name Primary?  . Closed fracture of distal end of right radius with routine healing, unspecified fracture morphology, subsequent encounter Yes

## 2019-12-09 NOTE — Progress Notes (Signed)
Patient removed short arm cast She was placed in long arm cast today without difficulty  Will follow up as scheduled

## 2020-01-14 ENCOUNTER — Ambulatory Visit (INDEPENDENT_AMBULATORY_CARE_PROVIDER_SITE_OTHER): Payer: Medicare PPO | Admitting: Orthopedic Surgery

## 2020-01-14 ENCOUNTER — Other Ambulatory Visit: Payer: Self-pay

## 2020-01-14 ENCOUNTER — Ambulatory Visit: Payer: Medicare PPO

## 2020-01-14 DIAGNOSIS — S52501D Unspecified fracture of the lower end of right radius, subsequent encounter for closed fracture with routine healing: Secondary | ICD-10-CM | POA: Diagnosis not present

## 2020-01-14 NOTE — Progress Notes (Signed)
Chief Complaint  Patient presents with  . Wrist Injury    12/01/19 right wrist fracture     Encounter Diagnosis  Name Primary?  . Closed fracture of distal end of right radius with routine healing, unspecified fracture morphology, subsequent encounter 12/01/19 Yes    6 weeks long-arm cast after a short arm cast was taken off repeatedly  We excepted deformity of the wrist based on the patient's mental status  She has abundant callus around the fracture fracture is stable no tenderness at the fracture site on exam  Recommend cast off and resume normal activity

## 2020-10-18 ENCOUNTER — Other Ambulatory Visit (HOSPITAL_COMMUNITY)
Admission: RE | Admit: 2020-10-18 | Discharge: 2020-10-18 | Disposition: A | Discharge status: Home / Self Care | Payer: Medicare PPO | Source: Skilled Nursing Facility | Attending: Family Medicine | Admitting: Family Medicine

## 2020-10-18 DIAGNOSIS — N390 Urinary tract infection, site not specified: Secondary | ICD-10-CM | POA: Diagnosis present

## 2020-10-18 LAB — URINALYSIS, ROUTINE W REFLEX MICROSCOPIC
Bacteria, UA: NONE SEEN
Bilirubin Urine: NEGATIVE
Glucose, UA: NEGATIVE mg/dL
Hgb urine dipstick: NEGATIVE
Ketones, ur: NEGATIVE mg/dL
Nitrite: NEGATIVE
Protein, ur: NEGATIVE mg/dL
Specific Gravity, Urine: 1.015 (ref 1.005–1.030)
pH: 7 (ref 5.0–8.0)

## 2020-10-19 LAB — URINE CULTURE: Culture: <10000 — AB

## 2020-12-23 ENCOUNTER — Other Ambulatory Visit (HOSPITAL_COMMUNITY)
Admission: RE | Admit: 2020-12-23 | Discharge: 2020-12-23 | Disposition: A | Discharge status: Home / Self Care | Payer: Medicare PPO | Source: Skilled Nursing Facility | Attending: *Deleted | Admitting: *Deleted

## 2020-12-27 ENCOUNTER — Other Ambulatory Visit (HOSPITAL_COMMUNITY)
Admission: RE | Admit: 2020-12-27 | Discharge: 2020-12-27 | Disposition: A | Discharge status: Home / Self Care | Payer: Medicare PPO | Source: Skilled Nursing Facility | Attending: Family Medicine | Admitting: Family Medicine

## 2020-12-27 DIAGNOSIS — N390 Urinary tract infection, site not specified: Secondary | ICD-10-CM | POA: Diagnosis present

## 2020-12-27 LAB — URINALYSIS, ROUTINE W REFLEX MICROSCOPIC
Bilirubin Urine: NEGATIVE
Glucose, UA: NEGATIVE mg/dL
Hgb urine dipstick: NEGATIVE
Ketones, ur: NEGATIVE mg/dL
Nitrite: NEGATIVE
Protein, ur: 30 mg/dL — AB
Specific Gravity, Urine: 1.011 (ref 1.005–1.030)
WBC, UA: >50 WBC/hpf — ABNORMAL HIGH (ref 0–5)
pH: 6 (ref 5.0–8.0)

## 2020-12-29 LAB — URINE CULTURE: Culture: >=100000 — AB

## 2021-01-26 ENCOUNTER — Encounter (HOSPITAL_COMMUNITY): Payer: Self-pay

## 2021-01-26 ENCOUNTER — Emergency Department (HOSPITAL_COMMUNITY): Payer: Medicare Other

## 2021-01-26 ENCOUNTER — Inpatient Hospital Stay (HOSPITAL_COMMUNITY)
Admission: EM | Admit: 2021-01-26 | Discharge: 2021-01-28 | DRG: 071 | Disposition: A | Discharge status: Skilled Nursing Facility | Payer: Medicare Other | Attending: Internal Medicine | Admitting: Internal Medicine

## 2021-01-26 ENCOUNTER — Other Ambulatory Visit: Payer: Self-pay

## 2021-01-26 DIAGNOSIS — K219 Gastro-esophageal reflux disease without esophagitis: Secondary | ICD-10-CM | POA: Diagnosis present

## 2021-01-26 DIAGNOSIS — E119 Type 2 diabetes mellitus without complications: Secondary | ICD-10-CM | POA: Diagnosis not present

## 2021-01-26 DIAGNOSIS — N390 Urinary tract infection, site not specified: Secondary | ICD-10-CM | POA: Diagnosis not present

## 2021-01-26 DIAGNOSIS — F32A Depression, unspecified: Secondary | ICD-10-CM | POA: Diagnosis not present

## 2021-01-26 DIAGNOSIS — B9620 Unspecified Escherichia coli [E. coli] as the cause of diseases classified elsewhere: Secondary | ICD-10-CM | POA: Diagnosis not present

## 2021-01-26 DIAGNOSIS — Z882 Allergy status to sulfonamides status: Secondary | ICD-10-CM

## 2021-01-26 DIAGNOSIS — F0281 Dementia in other diseases classified elsewhere with behavioral disturbance: Secondary | ICD-10-CM | POA: Diagnosis not present

## 2021-01-26 DIAGNOSIS — E785 Hyperlipidemia, unspecified: Secondary | ICD-10-CM | POA: Diagnosis present

## 2021-01-26 DIAGNOSIS — I10 Essential (primary) hypertension: Secondary | ICD-10-CM | POA: Diagnosis present

## 2021-01-26 DIAGNOSIS — Z79899 Other long term (current) drug therapy: Secondary | ICD-10-CM

## 2021-01-26 DIAGNOSIS — Z87442 Personal history of urinary calculi: Secondary | ICD-10-CM | POA: Diagnosis not present

## 2021-01-26 DIAGNOSIS — R4182 Altered mental status, unspecified: Secondary | ICD-10-CM

## 2021-01-26 DIAGNOSIS — E872 Acidosis: Secondary | ICD-10-CM | POA: Diagnosis not present

## 2021-01-26 DIAGNOSIS — Z888 Allergy status to other drugs, medicaments and biological substances status: Secondary | ICD-10-CM | POA: Diagnosis not present

## 2021-01-26 DIAGNOSIS — N179 Acute kidney failure, unspecified: Secondary | ICD-10-CM | POA: Diagnosis present

## 2021-01-26 DIAGNOSIS — D696 Thrombocytopenia, unspecified: Secondary | ICD-10-CM | POA: Diagnosis present

## 2021-01-26 DIAGNOSIS — Z20822 Contact with and (suspected) exposure to covid-19: Secondary | ICD-10-CM | POA: Diagnosis not present

## 2021-01-26 DIAGNOSIS — G9341 Metabolic encephalopathy: Principal | ICD-10-CM

## 2021-01-26 DIAGNOSIS — E860 Dehydration: Secondary | ICD-10-CM | POA: Diagnosis not present

## 2021-01-26 DIAGNOSIS — N3000 Acute cystitis without hematuria: Secondary | ICD-10-CM | POA: Diagnosis present

## 2021-01-26 LAB — CBC WITH DIFFERENTIAL/PLATELET
Abs Immature Granulocytes: 0.01 10*3/uL (ref 0.00–0.07)
Basophils Absolute: 0 10*3/uL (ref 0.0–0.1)
Basophils Relative: 0 %
Eosinophils Absolute: 0.1 10*3/uL (ref 0.0–0.5)
Eosinophils Relative: 2 %
HCT: 39.5 % (ref 36.0–46.0)
Hemoglobin: 11.9 g/dL — ABNORMAL LOW (ref 12.0–15.0)
Immature Granulocytes: 0 %
Lymphocytes Relative: 45 %
Lymphs Abs: 2 10*3/uL (ref 0.7–4.0)
MCH: 29 pg (ref 26.0–34.0)
MCHC: 30.1 g/dL (ref 30.0–36.0)
MCV: 96.3 fL (ref 80.0–100.0)
Monocytes Absolute: 0.3 10*3/uL (ref 0.1–1.0)
Monocytes Relative: 7 %
Neutro Abs: 2.1 10*3/uL (ref 1.7–7.7)
Neutrophils Relative %: 46 %
Platelets: 131 10*3/uL — ABNORMAL LOW (ref 150–400)
RBC: 4.1 MIL/uL (ref 3.87–5.11)
RDW: 13.7 % (ref 11.5–15.5)
WBC: 4.4 10*3/uL (ref 4.0–10.5)
nRBC: 0 % (ref 0.0–0.2)

## 2021-01-26 LAB — COMPREHENSIVE METABOLIC PANEL
ALT: 14 U/L (ref 0–44)
AST: 26 U/L (ref 15–41)
Albumin: 3.7 g/dL (ref 3.5–5.0)
Alkaline Phosphatase: 61 U/L (ref 38–126)
Anion gap: 10 (ref 5–15)
BUN: 26 mg/dL — ABNORMAL HIGH (ref 8–23)
CO2: 26 mmol/L (ref 22–32)
Calcium: 9.3 mg/dL (ref 8.9–10.3)
Chloride: 104 mmol/L (ref 98–111)
Creatinine, Ser: 1.08 mg/dL — ABNORMAL HIGH (ref 0.44–1.00)
GFR, Estimated: 56 mL/min — ABNORMAL LOW (ref >60)
Glucose, Bld: 118 mg/dL — ABNORMAL HIGH (ref 70–99)
Potassium: 4.3 mmol/L (ref 3.5–5.1)
Sodium: 140 mmol/L (ref 135–145)
Total Bilirubin: 0.4 mg/dL (ref 0.3–1.2)
Total Protein: 6.9 g/dL (ref 6.5–8.1)

## 2021-01-26 LAB — URINALYSIS, ROUTINE W REFLEX MICROSCOPIC
Bilirubin Urine: NEGATIVE
Glucose, UA: NEGATIVE mg/dL
Hgb urine dipstick: NEGATIVE
Ketones, ur: 5 mg/dL — AB
Nitrite: POSITIVE — AB
Protein, ur: 100 mg/dL — AB
Specific Gravity, Urine: 1.024 (ref 1.005–1.030)
pH: 7 (ref 5.0–8.0)

## 2021-01-26 LAB — MRSA PCR SCREENING: MRSA by PCR: NEGATIVE

## 2021-01-26 LAB — LACTIC ACID, PLASMA
Lactic Acid, Venous: 3.4 mmol/L (ref 0.5–1.9)
Lactic Acid, Venous: 2 mmol/L (ref 0.5–1.9)

## 2021-01-26 MED ORDER — POLYETHYLENE GLYCOL 3350 17 G PO PACK: 17.0000 g | PACK | Freq: Every day | ORAL | Status: DC | PRN

## 2021-01-26 MED ORDER — LORAZEPAM 2 MG/ML IJ SOLN
0.2500 mg | Freq: Once | INTRAMUSCULAR | Status: AC | PRN
  Administered 2021-01-26: 0.25 mg via INTRAMUSCULAR
  Filled 2021-01-26: qty 1

## 2021-01-26 MED ORDER — ENOXAPARIN SODIUM 40 MG/0.4ML IJ SOSY
40.0000 mg | PREFILLED_SYRINGE | INTRAMUSCULAR | Status: DC
  Administered 2021-01-26 – 2021-01-27 (×2): 40 mg via SUBCUTANEOUS
  Filled 2021-01-26 (×2): qty 0.4

## 2021-01-26 MED ORDER — ACETAMINOPHEN 650 MG RE SUPP: 650.0000 mg | Freq: Four times a day (QID) | RECTAL | Status: DC | PRN

## 2021-01-26 MED ORDER — DIVALPROEX SODIUM 125 MG PO DR TAB
125.0000 mg | DELAYED_RELEASE_TABLET | ORAL | Status: DC
  Administered 2021-01-27 – 2021-01-28 (×4): 125 mg via ORAL
  Filled 2021-01-26 (×11): qty 1

## 2021-01-26 MED ORDER — FAMOTIDINE 20 MG PO TABS
10.0000 mg | ORAL_TABLET | Freq: Two times a day (BID) | ORAL | Status: DC
  Administered 2021-01-27 – 2021-01-28 (×4): 10 mg via ORAL
  Filled 2021-01-26 (×4): qty 1

## 2021-01-26 MED ORDER — DOCUSATE SODIUM 100 MG PO CAPS
100.0000 mg | ORAL_CAPSULE | Freq: Two times a day (BID) | ORAL | Status: DC
  Administered 2021-01-27 – 2021-01-28 (×4): 100 mg via ORAL
  Filled 2021-01-26 (×4): qty 1

## 2021-01-26 MED ORDER — FLUTICASONE PROPIONATE 50 MCG/ACT NA SUSP
2.0000 | Freq: Every day | NASAL | Status: DC
  Administered 2021-01-27: 2 via NASAL
  Filled 2021-01-26: qty 16

## 2021-01-26 MED ORDER — SODIUM CHLORIDE 0.9 % IV SOLN: INTRAVENOUS | Status: AC

## 2021-01-26 MED ORDER — ATORVASTATIN CALCIUM 40 MG PO TABS
40.0000 mg | ORAL_TABLET | Freq: Every day | ORAL | Status: DC
  Administered 2021-01-27 (×2): 40 mg via ORAL
  Filled 2021-01-26 (×2): qty 1

## 2021-01-26 MED ORDER — CITALOPRAM HYDROBROMIDE 20 MG PO TABS
10.0000 mg | ORAL_TABLET | Freq: Every day | ORAL | Status: DC
  Administered 2021-01-27 – 2021-01-28 (×2): 10 mg via ORAL
  Filled 2021-01-26 (×2): qty 1

## 2021-01-26 MED ORDER — MELATONIN 5 MG PO TABS
5.0000 mg | ORAL_TABLET | Freq: Every day | ORAL | Status: DC
  Filled 2021-01-26 (×2): qty 1

## 2021-01-26 MED ORDER — MIRTAZAPINE 30 MG PO TABS
30.0000 mg | ORAL_TABLET | Freq: Every day | ORAL | Status: DC
  Administered 2021-01-27 (×2): 30 mg via ORAL
  Filled 2021-01-26: qty 1
  Filled 2021-01-26: qty 2

## 2021-01-26 MED ORDER — ONDANSETRON HCL 4 MG PO TABS: 4.0000 mg | ORAL_TABLET | Freq: Four times a day (QID) | ORAL | Status: DC | PRN

## 2021-01-26 MED ORDER — ONDANSETRON HCL 4 MG/2ML IJ SOLN: 4.0000 mg | Freq: Four times a day (QID) | INTRAMUSCULAR | Status: DC | PRN

## 2021-01-26 MED ORDER — SODIUM CHLORIDE 0.9 % IV SOLN
1.0000 g | Freq: Once | INTRAVENOUS | Status: AC
  Administered 2021-01-26: 1 g via INTRAVENOUS
  Filled 2021-01-26: qty 10

## 2021-01-26 MED ORDER — SODIUM CHLORIDE 0.9 % IV BOLUS
1000.0000 mL | Freq: Once | INTRAVENOUS | Status: AC
  Administered 2021-01-26: 1000 mL via INTRAVENOUS

## 2021-01-26 MED ORDER — MONTELUKAST SODIUM 10 MG PO TABS
10.0000 mg | ORAL_TABLET | Freq: Every day | ORAL | Status: DC
  Administered 2021-01-27 (×2): 10 mg via ORAL
  Filled 2021-01-26 (×2): qty 1

## 2021-01-26 MED ORDER — LORAZEPAM 2 MG/ML IJ SOLN
0.5000 mg | Freq: Four times a day (QID) | INTRAMUSCULAR | Status: DC | PRN
  Administered 2021-01-27 – 2021-01-28 (×3): 0.5 mg via INTRAVENOUS
  Filled 2021-01-26 (×3): qty 1

## 2021-01-26 MED ORDER — DIVALPROEX SODIUM 250 MG PO DR TAB
250.0000 mg | DELAYED_RELEASE_TABLET | ORAL | Status: DC
  Administered 2021-01-27: 250 mg via ORAL
  Filled 2021-01-26: qty 1

## 2021-01-26 MED ORDER — ACETAMINOPHEN 325 MG PO TABS: 650.0000 mg | ORAL_TABLET | Freq: Four times a day (QID) | ORAL | Status: DC | PRN

## 2021-01-26 MED ORDER — SODIUM CHLORIDE 0.9 % IV SOLN
1.0000 g | Freq: Once | INTRAVENOUS | Status: AC
  Administered 2021-01-27: 1 g via INTRAVENOUS
  Filled 2021-01-26: qty 10

## 2021-01-26 NOTE — ED Notes (Signed)
Pt with AMS unable to sign MSE.

## 2021-01-26 NOTE — ED Notes (Signed)
Pt restless, attempting to get out of bed. Pt given coloring book and crayons for redirection. Pt is now calm and cooperative at this time.

## 2021-01-26 NOTE — ED Triage Notes (Signed)
Pt brought to ED via [facility] for AMS x 3-4 days. Per [name], pt resides in dementia unit, but noticed AMS x 3-4 days, decreased appetite, and not answering yes/no questions like she normally does.
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
## 2021-01-26 NOTE — ED Notes (Signed)
Pt will not allow tech to get vitals at this time. Will attempt again

## 2021-01-26 NOTE — ED Provider Notes (Signed)
[facility] EMERGENCY DEPARTMENT Provider Note   CSN: [id number] Arrival date & time: 01/26/21  1344     History Chief Complaint  Patient presents with  . Altered Mental Status    [name] is a 70 y.o. female.  HPI    Patient is a 70 year old female with a history of arthritis, depression, GERD, headache, hypertension, nephrolithiasis, hyperlipidemia, diabetes, who presents to the emergency department today for evaluation of altered mental status.  Level 5 caveat as patient has history of dementia.  3:11 PM spoke with Crystal from [facility] who states that for the last few days patient has had decreased appetite, has been sleeping more and is not as conversant as normal.  She has not had any fevers but she has had some diarrhea.  She has not had any vomiting, cough or abnormal vitals.  Past Medical History:  Diagnosis Date  . Arthritis    fingers  . Depression   . GERD (gastroesophageal reflux disease)   . Headache(784.0)   . History of hypertension    NO MEDS NOW  . History of kidney stones   . Hydronephrosis, right   . Hyperlipidemia   . Renal calculi    LEFT  . Type 2 diabetes mellitus ([place])   . Urge urinary incontinence   . Wears glasses     Patient Active Problem List   Diagnosis Date Noted  . Nausea and vomiting 09/12/2011  . Hypercalcemia 09/12/2011  . UTI (lower urinary tract infection) 09/12/2011  . Anastomotic leak of biliary tree 08/26/2011  . Diabetes mellitus 08/21/2011  . Hypokalemia 08/21/2011  . Tachycardia 08/21/2011  . Biliary sludge 08/21/2011  . Hyperlipidemia   . GERD (gastroesophageal reflux disease)   . Hypertension     Past Surgical History:  Procedure Laterality Date  . ANTERIOR AND POSTERIOR REPAIR  07/13/2011   Procedure: ANTERIOR (CYSTOCELE) AND POSTERIOR REPAIR (RECTOCELE);  Surgeon: Arloa Koh;  Location: [facility];  Service: Gynecology;  Laterality: N/A;  . BILIARY STENT PLACEMENT  08/22/2011   Procedure: BILIARY STENT  PLACEMENT;  Surgeon: Missy Sabins, MD;  Location: [facility];  Service: Endoscopy;  Laterality: N/A;  50f x 5cm stent for bile leak. placed in CBD  . BLADDER SUSPENSION  07/13/2011   Procedure: TRANSVAGINAL TAPE (TVT) PROCEDURE;  Surgeon: Arloa Koh;  Location: [facility];  Service: Gynecology;  Laterality: N/A;  . CYSTO/ RETROGRADE PYELOGRAM/ RIGHT URETERAL DILATION  10-28-2008  . CYSTO/ RIGHT URETERAL BALLOON DILATION/ RIGHT URETEROSCOPIC STONE EXTRACTIO/ STENT  11-29-2010  . CYSTO/ URETEROSCOPIC LASER LITHO OF STONES  2009  &  09-30-2010  . CYSTOSCOPY  07/13/2011   Procedure: CYSTOSCOPY;  Surgeon: Arloa Koh;  Location: [facility];  Service: Gynecology;  Laterality: N/A;  . CYSTOSCOPY/RETROGRADE/URETEROSCOPY/STONE EXTRACTION WITH BASKET Bilateral 05/05/2013   Procedure: CYSTOSCOPY/RETROGRADE/URETEROSCOPY/STONE EXTRACTION WITH BASKET   Procedure:Cysto, Right Retrograde Pyelogram, Left Ureteroscopy with Holmium Laser, Extraction of Stones;  Surgeon: Franchot Gallo, MD;  Location: [facility];  Service: Urology;  Laterality: Bilateral;  . ERCP  08/22/2011   Procedure: ENDOSCOPIC RETROGRADE CHOLANGIOPANCREATOGRAPHY (ERCP);  Surgeon: Missy Sabins, MD;  Location: [facility];  Service: Endoscopy;  Laterality: N/A;  . ERCP  12/15/2011   Procedure: ENDOSCOPIC RETROGRADE CHOLANGIOPANCREATOGRAPHY (ERCP);  Surgeon: Missy Sabins, MD;  Location: [facility];  Service: Endoscopy;  Laterality: N/A;  stent removal/mod. sedation  . EXTRACORPOREAL SHOCK WAVE LITHOTRIPSY Left 04-17-2011  . HOLMIUM LASER APPLICATION Left 01/28/9527   Procedure: HOLMIUM LASER APPLICATION;  Surgeon: Franchot Gallo, MD;  Location: [facility];  Service: Urology;  Laterality: Left;  . LAPAROSCOPIC ASSISTED VAGINAL HYSTERECTOMY  07/13/2011   Procedure: LAPAROSCOPIC ASSISTED VAGINAL HYSTERECTOMY;  Surgeon: Arloa Koh;  Location: [facility];  Service: Gynecology;  Laterality: N/A;  . LAPAROSCOPIC  CHOLECYSTECTOMY  08-17-2011  . SALPINGOOPHORECTOMY  07/13/2011   Procedure: SALPINGO OOPHERECTOMY;  Surgeon: Arloa Koh;  Location: [facility];  Service: Gynecology;  Laterality: Bilateral;     OB History   No obstetric history on file.     Family History  Problem Relation Age of Onset  . Dementia Father   . Anesthesia problems Neg Hx   . Hypotension Neg Hx   . Malignant hyperthermia Neg Hx   . Pseudochol deficiency Neg Hx     Social History   Tobacco Use  . Smoking status: Never Smoker  . Smokeless tobacco: Never Used  Substance Use Topics  . Alcohol use: No  . Drug use: No    Home Medications Prior to Admission medications   Medication Sig Start Date End Date Taking? Authorizing Provider  atorvastatin (LIPITOR) 20 MG tablet Take 40 mg by mouth at bedtime.   Yes Verlee Monte, MD  cholecalciferol (VITAMIN D3) 25 MCG (1000 UNIT) tablet Take 2,000 Units by mouth daily.   Yes [provider]  citalopram (CELEXA) 20 MG tablet Take 10 mg by mouth daily.   Yes [provider]  divalproex (DEPAKOTE) 125 MG DR tablet Take 125-250 mg by mouth 3 (three) times daily. 1 tablet at 0800 and 1400, 2 tablets at bedtime (1900) 01/13/21  Yes [provider]  docusate sodium (COLACE) 100 MG capsule Take 100 mg by mouth 2 (two) times daily.   Yes [provider]  famotidine (PEPCID) 10 MG tablet Take 10 mg by mouth 2 (two) times daily.   Yes [provider]  fluticasone (FLONASE) 50 MCG/ACT nasal spray Place 2 sprays into both nostrils daily. 01/26/21  Yes [provider]  Melatonin 5 MG CAPS Take 1 capsule by mouth at bedtime.   Yes [provider]  mirtazapine (REMERON) 30 MG tablet Take 30 mg by mouth at bedtime.   Yes [provider]  montelukast (SINGULAIR) 10 MG tablet Take 10 mg by mouth at bedtime.   Yes [provider]  vitamin B-12 (CYANOCOBALAMIN) 500 MCG tablet Take 500 mcg by mouth daily.   Yes [provider]    Allergies    Flomax [tamsulosin hcl] and Sulfa antibiotics  Review of Systems   Review of Systems  Unable to perform ROS: Dementia    Physical Exam Updated Vital Signs BP 109/87 (BP Location: Right Arm)   Pulse 71   Temp 97.7 F (36.5 C) (Oral)   Resp 18   Ht 5\' 2"  (1.575 m)   Wt 59 kg   SpO2 99%   BMI 23.78 kg/m   Physical Exam Vitals and nursing note reviewed.  Constitutional:      General: She is not in acute distress.    Appearance: She is well-developed.  HENT:     Head: Normocephalic and atraumatic.  Eyes:     Conjunctiva/sclera: Conjunctivae normal.  Cardiovascular:     Rate and Rhythm: Regular rhythm. Tachycardia present.     Heart sounds: Normal heart sounds. No murmur heard.   Pulmonary:     Effort: Pulmonary effort is normal. No respiratory distress.     Breath sounds: Normal breath sounds. No wheezing, rhonchi  or rales.  Abdominal:     General: Bowel sounds are normal.     Palpations: Abdomen is soft.     Tenderness: There is no abdominal tenderness. There is no guarding or rebound.  Musculoskeletal:     Cervical back: Neck supple.  Skin:    General: Skin is warm and dry.  Neurological:     Mental Status: She is alert.     Comments: Alert, demented. No facial droop.      ED Results / Procedures / Treatments   Labs (all labs ordered are listed, but only abnormal results are displayed) Labs Reviewed  COMPREHENSIVE METABOLIC PANEL - Abnormal; Notable for the following components:      Result Value   Glucose, Bld 118 (*)    BUN 26 (*)    Creatinine, Ser 1.08 (*)    GFR, Estimated 56 (*)    All other components within normal limits  CBC WITH DIFFERENTIAL/PLATELET - Abnormal; Notable for the following components:   Hemoglobin 11.9 (*)    Platelets 131 (*)    All other components within normal limits  URINALYSIS, ROUTINE W REFLEX MICROSCOPIC - Abnormal; Notable for the following components:   APPearance HAZY (*)     Ketones, ur 5 (*)    Protein, ur 100 (*)    Nitrite POSITIVE (*)    Leukocytes,Ua LARGE (*)    All other components within normal limits  LACTIC ACID, PLASMA - Abnormal; Notable for the following components:   Lactic Acid, Venous 3.4 (*)    All other components within normal limits  LACTIC ACID, PLASMA - Abnormal; Notable for the following components:   Lactic Acid, Venous 2.0 (*)    All other components within normal limits  CULTURE, BLOOD (ROUTINE X 2)  CULTURE, BLOOD (ROUTINE X 2)  URINE CULTURE    EKG EKG Interpretation  Date/Time:  Wednesday January 26 2021 15:17:28 EDT Ventricular Rate:  73 PR Interval:  144 QRS Duration: 72 QT Interval:  410 QTC Calculation: 451 R Axis:   69 Text Interpretation: Sinus rhythm with occasional Premature ventricular complexes Septal infarct , age undetermined Abnormal ECG Interpretation limited secondary to artifact Confirmed by Fredia Sorrow [phone]) on 01/26/2021 3:28:12 PM   Radiology DG Chest 1 View  Result Date: 01/26/2021 CLINICAL DATA:  Altered mental status EXAM: CHEST  1 VIEW COMPARISON:  None. FINDINGS: The heart size and mediastinal contours are within normal limits. Both lungs are clear. The visualized skeletal structures are unremarkable. IMPRESSION: No active disease. Electronically Signed   By: Rolm Baptise M.D.   On: 01/26/2021 15:09   CT Head Wo Contrast  Result Date: 01/26/2021 CLINICAL DATA:  Altered mental status for 3 or 4 days. EXAM: CT HEAD WITHOUT CONTRAST TECHNIQUE: Contiguous axial images were obtained from the base of the skull through the vertex without intravenous contrast. COMPARISON:  Head CT scan 09/13/2011. FINDINGS: Brain: No evidence of acute infarction, hemorrhage, hydrocephalus, extra-axial collection or mass lesion/mass effect. Cortical atrophy and chronic microvascular ischemic change are new since the prior study. Vascular: No hyperdense vessel or unexpected calcification. Skull: Intact.  No focal lesion.  Sinuses/Orbits: Mucosal thickening is seen in the right frontal and left sphenoid sinuses. Other: None. IMPRESSION: No acute abnormality. Atrophy and chronic microvascular ischemic change are new since the prior CT. Mucosal thickening left sphenoid and right frontal sinuses. Electronically Signed   By: Inge Rise M.D.   On: 01/26/2021 14:58    Procedures Procedures   Medications Ordered in  ED Medications  cefTRIAXone (ROCEPHIN) 1 g in sodium chloride 0.9 % 100 mL IVPB (has no administration in time range)  sodium chloride 0.9 % bolus 1,000 mL (0 mLs Intravenous Stopped 01/26/21 1700)  LORazepam (ATIVAN) injection 0.25 mg (0.25 mg Intramuscular Given 01/26/21 1924)  sodium chloride 0.9 % bolus 1,000 mL (1,000 mLs Intravenous New Bag/Given 01/26/21 1925)    ED Course  I have reviewed the triage vital signs and the nursing notes.  Pertinent labs & imaging results that were available during my care of the patient were reviewed by me and considered in my medical decision making (see chart for details).    MDM Rules/Calculators/A&P                          70 year old female presents to the emergency department today from her facility for evaluation of altered mental status.  Is demented at baseline but for the past several days has been more fatigued and not talking as much as normal.  She has also had decreased p.o. intake.  Patient has some borderline hypotension here in the ED but otherwise vital signs are reassuring.  She is nontoxic and in no acute distress on my evaluation.  Reviewed/interpreted labs CBC w/o leukocytosis, mild anemia CMP with mildly elevated BUN and creat at 26/1.08 UA consistent with uti, urine culture added, ceftriaxonegiven Lactic elevated at 3.4  Pt with uti, had elevated lactic acid and signs of dehydration including some mild hypotension. Will admit for further tx  7:31 PM CONSULT with Dr. Denton Brick who accepts patient for admission   Final Clinical  Impression(s) / ED Diagnoses Final diagnoses:  Acute cystitis without hematuria  Altered mental status, unspecified altered mental status type    Rx / DC Orders ED Discharge Orders    None       Bishop Dublin 01/26/21 1932    Fredia Sorrow, MD 02/04/21 1024

## 2021-01-27 DIAGNOSIS — I10 Essential (primary) hypertension: Secondary | ICD-10-CM

## 2021-01-27 DIAGNOSIS — N3000 Acute cystitis without hematuria: Secondary | ICD-10-CM | POA: Diagnosis not present

## 2021-01-27 DIAGNOSIS — G9341 Metabolic encephalopathy: Secondary | ICD-10-CM | POA: Diagnosis not present

## 2021-01-27 LAB — VITAMIN B12: Vitamin B-12: 885 pg/mL (ref 180–914)

## 2021-01-27 LAB — CBC
HCT: 33.7 % — ABNORMAL LOW (ref 36.0–46.0)
Hemoglobin: 10.2 g/dL — ABNORMAL LOW (ref 12.0–15.0)
MCH: 28.9 pg (ref 26.0–34.0)
MCHC: 30.3 g/dL (ref 30.0–36.0)
MCV: 95.5 fL (ref 80.0–100.0)
Platelets: 107 10*3/uL — ABNORMAL LOW (ref 150–400)
RBC: 3.53 MIL/uL — ABNORMAL LOW (ref 3.87–5.11)
RDW: 13.7 % (ref 11.5–15.5)
WBC: 4.8 10*3/uL (ref 4.0–10.5)
nRBC: 0 % (ref 0.0–0.2)

## 2021-01-27 LAB — BASIC METABOLIC PANEL
Anion gap: 6 (ref 5–15)
BUN: 19 mg/dL (ref 8–23)
CO2: 25 mmol/L (ref 22–32)
Calcium: 8.7 mg/dL — ABNORMAL LOW (ref 8.9–10.3)
Chloride: 110 mmol/L (ref 98–111)
Creatinine, Ser: 0.8 mg/dL (ref 0.44–1.00)
GFR, Estimated: >60 mL/min (ref >60)
Glucose, Bld: 101 mg/dL — ABNORMAL HIGH (ref 70–99)
Potassium: 3.8 mmol/L (ref 3.5–5.1)
Sodium: 141 mmol/L (ref 135–145)

## 2021-01-27 LAB — HIV ANTIBODY (ROUTINE TESTING W REFLEX): HIV Screen 4th Generation wRfx: NONREACTIVE

## 2021-01-27 LAB — FOLATE: Folate: 6.9 ng/mL (ref >5.9)

## 2021-01-27 LAB — T4, FREE: Free T4: 0.65 ng/dL (ref 0.61–1.12)

## 2021-01-27 LAB — SARS CORONAVIRUS 2 (TAT 6-24 HRS): SARS Coronavirus 2: NEGATIVE

## 2021-01-27 LAB — TSH: TSH: 4.114 u[IU]/mL (ref 0.350–4.500)

## 2021-01-27 MED ORDER — ENSURE ENLIVE PO LIQD
237.0000 mL | Freq: Two times a day (BID) | ORAL | Status: DC
  Administered 2021-01-27 – 2021-01-28 (×4): 237 mL via ORAL

## 2021-01-27 MED ORDER — SODIUM CHLORIDE 0.9 % IV SOLN: INTRAVENOUS | Status: DC

## 2021-01-27 MED ORDER — MELATONIN 3 MG PO TABS
6.0000 mg | ORAL_TABLET | Freq: Every day | ORAL | Status: DC
  Administered 2021-01-27 (×2): 6 mg via ORAL
  Filled 2021-01-27 (×2): qty 2

## 2021-01-27 NOTE — Progress Notes (Signed)
PROGRESS NOTE  [name] XBJ:[id number] DOB: 1950-12-25 DOA: 01/26/2021 PCP: Rory Percy, MD  Brief History:  70 year old female with history of diabetes mellitus type 2, hypertension, depression, hyperlipidemia, and dementia presenting with altered mental status for 3 to 4 days.  The patient is a poor historian secondary to her dementia.  History is obtained from review of the medical record.  Apparently, the patient normally is able to answer simple questions with yes/no.  However the staff at [facility] noted the patient to be only mumbling incomprehensibly and not responding properly.  As result, the patient was sent for further evaluation.  There is no reports of vomiting, diarrhea, abdominal pain.     ED Course: Stable vitals.  WBC 4.4.  Lactic acid 3.4 >> 2.  Creatinine 1.08.  UA suggestive of UTI.  Head and chest CT without acute abnormality.  EKG sinus rhythm.  In the ED, patient remove O2 saturation monitor from her finger and hit RN and stated "I used to repeat just leave me alone ." IV ceftriaxone started.  Hospitalist to admit. Assessment/Plan:  Acute metabolic encephalopathy -Secondary to UTI and dehydration -Mental status improving but not at baseline -Continue empiric ceftriaxone -Continue IV fluids -TSH 4.114 -Free T4 3.08 -M57 846 -Folic acid 6.9 -Personally reviewed chest x-ray--no infiltrates or edema -Personally reviewed EKG--sinus rhythm, nonspecific T wave change  UTI -Continue ceftriaxone pending urine culture  Lactic acidosis -Secondary to volume depletion -Sepsis ruled out -Continue IV fluids  Thrombocytopenia -Due to acute medical illness -Serum N62 952 -Folic acid 6.9 -TSH 8.41  Diabetes mellitus type 2 -Check A1c  Dementia with behavioral disturbance -At risk for sundowning -Continue Celexa, Remeron, Depakote  Hyperlipidemia -Continue statin        Status is: Inpatient  Remains inpatient appropriate because:IV  treatments appropriate due to intensity of illness or inability to take PO   Dispo: The patient is from: ALF              Anticipated d/c is to: ALF              Patient currently is not medically stable to d/c.   Difficult to place patient No        Family Communication:  no Family at bedside  Consultants:  none Code Status:  FULL  DVT Prophylaxis:  [place] Lovenox   Procedures: As Listed in Progress Note Above  Antibiotics: Ceftriaxone 6/1>>     Subjective: Patient denies fevers, chills, headache, chest pain, dyspnea, nausea, vomiting, diarrhea, abdominal pain, dysuria,  Objective: Vitals:   01/27/21 0200 01/27/21 0531 01/27/21 0634 01/27/21 1422  BP: (!) 105/52 (!) 82/48 (!) 117/53 100/69  Pulse: 62 86 (!) 50 61  Resp: 14 18 16 18   Temp: 98.6 F (37 C) 98.5 F (36.9 C)  98.6 F (37 C)  TempSrc: Temporal     SpO2: 100% 100% 97% 100%  Weight:      Height:        Intake/Output Summary (Last 24 hours) at 01/27/2021 1541 Last data filed at 01/27/2021 1300 Gross per 24 hour  Intake 3561.6 ml  Output --  Net 3561.6 ml   Weight change:  Exam:   General:  Pt is alert, follows commands appropriately, not in acute distress  HEENT: No icterus, No thrush, No neck mass, [place]/AT  Cardiovascular: RRR, S1/S2, no rubs, no gallops  Respiratory: CTA bilaterally, no wheezing, no crackles, no rhonchi  Abdomen: Soft/+BS,  non tender, non distended, no guarding  Extremities: No edema, No lymphangitis, No petechiae, No rashes, no synovitis   Data Reviewed: I have personally reviewed following labs and imaging studies Basic Metabolic Panel: Recent Labs  Lab 01/26/21 1431 01/27/21 0533  NA 140 141  K 4.3 3.8  CL 104 110  CO2 26 25  GLUCOSE 118* 101*  BUN 26* 19  CREATININE 1.08* 0.80  CALCIUM 9.3 8.7*   Liver Function Tests: Recent Labs  Lab 01/26/21 1431  AST 26  ALT 14  ALKPHOS 61  BILITOT 0.4  PROT 6.9  ALBUMIN 3.7   No results for input(s): LIPASE,  AMYLASE in the last 168 hours. No results for input(s): AMMONIA in the last 168 hours. Coagulation Profile: No results for input(s): INR, PROTIME in the last 168 hours. CBC: Recent Labs  Lab 01/26/21 1431 01/27/21 0533  WBC 4.4 4.8  NEUTROABS 2.1  --   HGB 11.9* 10.2*  HCT 39.5 33.7*  MCV 96.3 95.5  PLT 131* 107*   Cardiac Enzymes: No results for input(s): CKTOTAL, CKMB, CKMBINDEX, TROPONINI in the last 168 hours. BNP: Invalid input(s): POCBNP CBG: No results for input(s): GLUCAP in the last 168 hours. HbA1C: No results for input(s): HGBA1C in the last 72 hours. Urine analysis:    Component Value Date/Time   COLORURINE YELLOW 01/26/2021 1755   APPEARANCEUR HAZY (A) 01/26/2021 1755   LABSPEC 1.024 01/26/2021 1755   PHURINE 7.0 01/26/2021 1755   GLUCOSEU NEGATIVE 01/26/2021 1755   HGBUR NEGATIVE 01/26/2021 1755   BILIRUBINUR NEGATIVE 01/26/2021 1755   KETONESUR 5 (A) 01/26/2021 1755   PROTEINUR 100 (A) 01/26/2021 1755   UROBILINOGEN 1.0 09/12/2011 0049   NITRITE POSITIVE (A) 01/26/2021 1755   LEUKOCYTESUR LARGE (A) 01/26/2021 1755   Sepsis Labs: @LABRCNTIP (procalcitonin:4,lacticidven:4) ) Recent Results (from the past 240 hour(s))  Blood culture (routine x 2)     Status: None (Preliminary result)   Collection Time: 01/26/21  4:43 PM   Specimen: BLOOD  Result Value Ref Range Status   Specimen Description BLOOD  Final   Special Requests NONE  Final   Culture   Final    NO GROWTH < 24 HOURS Performed at [facility], [address] [place], [place] [address]    Report Status PENDING  Incomplete  Blood culture (routine x 2)     Status: None (Preliminary result)   Collection Time: 01/26/21  6:00 PM   Specimen: BLOOD RIGHT ARM  Result Value Ref Range Status   Specimen Description BLOOD RIGHT ARM  Final   Special Requests   Final    BOTTLES DRAWN AEROBIC AND ANAEROBIC Blood Culture adequate volume   Culture   Final    NO GROWTH < 24 HOURS Performed at [facility], [address] [place], [place] [address]    Report Status PENDING  Incomplete  SARS CORONAVIRUS 2 (Nyia Tsao 6-24 HRS) Nasopharyngeal Nasopharyngeal Swab     Status: None   Collection Time: 01/26/21  7:35 PM   Specimen: Nasopharyngeal Swab  Result Value Ref Range Status   SARS Coronavirus 2 NEGATIVE NEGATIVE Final    Comment: (NOTE) SARS-CoV-2 target nucleic acids are NOT DETECTED.  The SARS-CoV-2 RNA is generally detectable in upper and lower respiratory specimens during the acute phase of infection. Negative results do not preclude SARS-CoV-2 infection, do not rule out co-infections with other pathogens, and should not be used as the sole basis for treatment or other patient management decisions. Negative results must be  combined with clinical observations, patient history, and epidemiological information. The expected result is Negative.  Fact Sheet for Patients: SugarRoll.be  Fact Sheet for Healthcare Providers: https://www.woods-mathews.com/  This test is not yet approved or cleared by the [place] FDA and  has been authorized for detection and/or diagnosis of SARS-CoV-2 by FDA under an Emergency Use Authorization (EUA). This EUA will remain  in effect (meaning this test can be used) for the duration of the COVID-19 declaration under Se ction 564(b)(1) of the Act, 21 U.S.C. section 360bbb-3(b)(1), unless the authorization is terminated or revoked sooner.  Performed at [facility], [place] [address] [place] [address], [place], [place] [address]   MRSA PCR Screening     Status: None   Collection Time: 01/26/21 10:07 PM   Specimen: Nasopharyngeal  Result Value Ref Range Status   MRSA by PCR NEGATIVE NEGATIVE Final    Comment:        The GeneXpert MRSA Assay (FDA approved for NASAL specimens only), is one component of a comprehensive MRSA colonization surveillance program. It is not intended to diagnose MRSA infection nor to guide  or monitor treatment for MRSA infections. Performed at [facility], [address], [place], [place] [address]      Scheduled Meds: . atorvastatin  40 mg Oral QHS  . citalopram  10 mg Oral Daily  . divalproex  125 mg Oral 2 times per day  . divalproex  250 mg Oral Q24H  . docusate sodium  100 mg Oral BID  . enoxaparin (LOVENOX) injection  40 mg Subcutaneous Q24H  . famotidine  10 mg Oral BID  . feeding supplement  237 mL Oral BID BM  . fluticasone  2 spray Each Nare Daily  . melatonin  6 mg Oral QHS  . mirtazapine  30 mg Oral QHS  . montelukast  10 mg Oral QHS   Continuous Infusions: . cefTRIAXone (ROCEPHIN)  IV      Procedures/Studies: DG Chest 1 View  Result Date: 01/26/2021 CLINICAL DATA:  Altered mental status EXAM: CHEST  1 VIEW COMPARISON:  None. FINDINGS: The heart size and mediastinal contours are within normal limits. Both lungs are clear. The visualized skeletal structures are unremarkable. IMPRESSION: No active disease. Electronically Signed   By: Rolm Baptise M.D.   On: 01/26/2021 15:09   CT Head Wo Contrast  Result Date: 01/26/2021 CLINICAL DATA:  Altered mental status for 3 or 4 days. EXAM: CT HEAD WITHOUT CONTRAST TECHNIQUE: Contiguous axial images were obtained from the base of the skull through the vertex without intravenous contrast. COMPARISON:  Head CT scan 09/13/2011. FINDINGS: Brain: No evidence of acute infarction, hemorrhage, hydrocephalus, extra-axial collection or mass lesion/mass effect. Cortical atrophy and chronic microvascular ischemic change are new since the prior study. Vascular: No hyperdense vessel or unexpected calcification. Skull: Intact.  No focal lesion. Sinuses/Orbits: Mucosal thickening is seen in the right frontal and left sphenoid sinuses. Other: None. IMPRESSION: No acute abnormality. Atrophy and chronic microvascular ischemic change are new since the prior CT. Mucosal thickening left sphenoid and right frontal sinuses. Electronically Signed    By: Inge Rise M.D.   On: 01/26/2021 14:58    Orson Eva, DO  [facility]  If 7PM-7AM, please contact night-coverage [email or web address] Password [email or web address] 01/27/2021, 3:41 PM   LOS: 1 day

## 2021-01-28 DIAGNOSIS — G9341 Metabolic encephalopathy: Secondary | ICD-10-CM | POA: Diagnosis not present

## 2021-01-28 DIAGNOSIS — N3000 Acute cystitis without hematuria: Secondary | ICD-10-CM | POA: Diagnosis not present

## 2021-01-28 DIAGNOSIS — I10 Essential (primary) hypertension: Secondary | ICD-10-CM | POA: Diagnosis not present

## 2021-01-28 LAB — BASIC METABOLIC PANEL
Anion gap: 7 (ref 5–15)
BUN: 9 mg/dL (ref 8–23)
CO2: 25 mmol/L (ref 22–32)
Calcium: 8.7 mg/dL — ABNORMAL LOW (ref 8.9–10.3)
Chloride: 107 mmol/L (ref 98–111)
Creatinine, Ser: 0.58 mg/dL (ref 0.44–1.00)
GFR, Estimated: >60 mL/min (ref >60)
Glucose, Bld: 89 mg/dL (ref 70–99)
Potassium: 3.6 mmol/L (ref 3.5–5.1)
Sodium: 139 mmol/L (ref 135–145)

## 2021-01-28 LAB — HEMOGLOBIN A1C
Hgb A1c MFr Bld: 5.7 % — ABNORMAL HIGH (ref 4.8–5.6)
Mean Plasma Glucose: 117 mg/dL

## 2021-01-28 LAB — MAGNESIUM: Magnesium: 1.6 mg/dL — ABNORMAL LOW (ref 1.7–2.4)

## 2021-01-28 MED ORDER — CEFDINIR 300 MG PO CAPS: 300.0000 mg | ORAL_CAPSULE | Freq: Two times a day (BID) | ORAL | 0 refills | Status: DC

## 2021-01-28 MED ORDER — MAGNESIUM SULFATE 2 GM/50ML IV SOLN
2.0000 g | Freq: Once | INTRAVENOUS | Status: AC
  Administered 2021-01-28: 2 g via INTRAVENOUS
  Filled 2021-01-28: qty 50

## 2021-01-28 MED ORDER — CEFDINIR 300 MG PO CAPS
300.0000 mg | ORAL_CAPSULE | Freq: Two times a day (BID) | ORAL | Status: DC
  Administered 2021-01-28: 300 mg via ORAL
  Filled 2021-01-28: qty 1

## 2021-01-28 NOTE — Evaluation (Signed)
Physical Therapy Evaluation Patient Details Name: [name] MRN: [id number] DOB: July 05, 1951 Today's Date: 01/28/2021   History of Present Illness  (P) [name] is a 70 y.o. female with medical history significant for hypertension, diabetes mellitus, depression.  Patient was brought to the ED via EMS from [facility] unit reports of altered mental status for 3 to 4 days.  Per staff, patient's was not answering yes or no questions like she normally does.on my evaluation, patient is awake alert, mumbling incomprehensibly in response to questions    Clinical Impression  Patient limited for functional mobility as stated below secondary to weakness, fatigue and lethargy. Patient requires frequent cueing and assist to upright trunk to transition to seated EOB. Patient demonstrates fair sitting balance and impaired sitting tolerance due to fatigue/lethargy. Patient requests to return to supine. Patient is a poor historian and is unable to provide details of equipment used or prior level of function.  Patient will benefit from continued physical therapy in hospital and recommended venue below to increase strength, balance, endurance for safe ADLs and gait.     Follow Up Recommendations SNF;Supervision/Assistance - 24 hour;Supervision for mobility/OOB    Equipment Recommendations  None recommended by PT    Recommendations for Other Services       Precautions / Restrictions Precautions Precautions: Fall Restrictions Weight Bearing Restrictions: No      Mobility  Bed Mobility Overal bed mobility: Needs Assistance Bed Mobility: Supine to Sit;Sit to Supine     Supine to sit: Min assist Sit to supine: Min assist   General bed mobility comments: Patient requires assist and verbal cueing to transition to seated EOB    Transfers                    Ambulation/Gait                Stairs            Wheelchair Mobility    Modified Rankin (Stroke Patients  Only)       Balance Overall balance assessment: Needs assistance Sitting-balance support: Bilateral upper extremity supported Sitting balance-Leahy Scale: Fair Sitting balance - Comments: seated EOB                                     Pertinent Vitals/Pain Pain Assessment: No/denies pain    Home Living Family/patient expects to be discharged to:: Assisted living                 Additional Comments: Patient poor historian and unable to provide info on equipment used or PLOF    Prior Function           Comments: Patient poor historian and unable to provide info on equipment used or PLOF     Hand Dominance        Extremity/Trunk Assessment   Upper Extremity Assessment Upper Extremity Assessment: Difficult to assess due to impaired cognition    Lower Extremity Assessment Lower Extremity Assessment: Difficult to assess due to impaired cognition    Cervical / Trunk Assessment Cervical / Trunk Assessment: Normal  Communication   Communication: Expressive difficulties  Cognition Arousal/Alertness: Lethargic Behavior During Therapy: Flat affect Overall Cognitive Status: No family/caregiver present to determine baseline cognitive functioning  General Comments      Exercises     Assessment/Plan    PT Assessment Patient needs continued PT services  PT Problem List Decreased strength;Decreased mobility;Decreased safety awareness;Decreased activity tolerance;Decreased balance;Decreased cognition       PT Treatment Interventions DME instruction;Therapeutic exercise;Gait training;Balance training;Stair training;Neuromuscular re-education;Functional mobility training;Patient/family education;Therapeutic activities    PT Goals (Current goals can be found in the Care Plan section)  Acute Rehab PT Goals Patient Stated Goal: none stated PT Goal Formulation: With patient Time For Goal  Achievement: 02/11/21 Potential to Achieve Goals: Fair    Frequency Min 3X/week   Barriers to discharge        Co-evaluation               AM-PAC PT "6 Clicks" Mobility  Outcome Measure Help needed turning from your back to your side while in a flat bed without using bedrails?: None Help needed moving from lying on your back to sitting on the side of a flat bed without using bedrails?: A Little Help needed moving to and from a bed to a chair (including a wheelchair)?: A Lot Help needed standing up from a chair using your arms (e.g., wheelchair or bedside chair)?: A Lot Help needed to walk in hospital room?: A Lot Help needed climbing 3-5 steps with a railing? : A Lot 6 Click Score: 15    End of Session   Activity Tolerance: Patient limited by lethargy Patient left: in bed;with call bell/phone within reach;with bed alarm set Nurse Communication: Mobility status PT Visit Diagnosis: Unsteadiness on feet (R26.81);Other abnormalities of gait and mobility (R26.89);Muscle weakness (generalized) (M62.81)    Time: 3953-2023 PT Time Calculation (min) (ACUTE ONLY): 9 min   Charges:   PT Evaluation $PT Eval Low Complexity: 1 Low          9:19 AM, 01/28/21 Mearl Latin PT, DPT Physical Therapist at [facility]

## 2021-01-28 NOTE — TOC Transition Note (Signed)
Transition of Care [facility]) - CM/SW Discharge Note   Patient Details  Name: [name] MRN: [id number] Date of Birth: 09/23/1950  [facility] CM/SW Contact:  Natasha Bence, LCSW Phone Number: 01/28/2021, 2:44 PM   Clinical Narrative:    CSW notified of patient's readiness for discharge. Sharyn Lull with [name] agreeable to take patient back. CSW faxed discharge summary and FL2. [facility] requested EMS for patient transport. CSW completed med necessity and called EMS. Nurse to call report. TOC signing off.    Final next level of care: Assisted Living Barriers to Discharge: Barriers Resolved   Patient Goals and CMS Choice Patient states their goals for this hospitalization and ongoing recovery are:: Return to ALF CMS Medicare.gov Compare Post Acute Care list provided to:: Patient Choice offered to / list presented to : Patient  Discharge Placement                Patient to be transferred to facility by: [facility] Name of family member notified: Sherron Ales (Sister)   [phone] Patient and family notified of of transfer: 01/28/21  Discharge Plan and Services                                     Social Determinants of Health (SDOH) Interventions     Readmission Risk Interventions No flowsheet data found.

## 2021-01-28 NOTE — Progress Notes (Signed)
IV removed and report called to [facility]  EMS to transport

## 2021-01-28 NOTE — Progress Notes (Signed)
Stated name when asked but could not answer other orientation questions.  Set up for breakfast and began to eat coffee with a fork.  Did drink all of ensure when held and offered with a straw.  Ate a few bites of oatmeal when fed.  Stated had to go to bathroom and assisted to bedside commode but when seated on bs commode and encouraged to pee, stated that she didn't know where to pee.  Bedpads were already wet from incontinence and never voided in bedside. Assisted back to bed and went to sleep.  Bed alarm on.

## 2021-01-28 NOTE — NC FL2 (Signed)
[place] LEVEL OF CARE SCREENING TOOL     IDENTIFICATION  Patient Name: [name] Birthdate: [birth year] Sex: female Admission Date (Current Location): 01/26/2021  [facility] and [place] Number:  [employer] and Address:  [facility] [address] [place]      Provider Number: [id number]  Attending Physician Name and Address:  Orson Eva, MD  Relative Name and Phone Number:  Sherron Ales (Sister)   [phone]    Current Level of Care: Hospital Recommended Level of Care: [place] Prior Approval Number:    Date Approved/Denied: 01/28/21 PASRR Number: [id number]  Discharge Plan: Other (Comment) (ALF)    Current Diagnoses: Patient Active Problem List   Diagnosis Date Noted  . Acute cystitis without hematuria   . Acute metabolic encephalopathy 75/64/3329  . Nausea and vomiting 09/12/2011  . Hypercalcemia 09/12/2011  . UTI (lower urinary tract infection) 09/12/2011  . Anastomotic leak of biliary tree 08/26/2011  . Diabetes mellitus ([place]) 08/21/2011  . Hypokalemia 08/21/2011  . Tachycardia 08/21/2011  . Biliary sludge 08/21/2011  . Hyperlipidemia   . GERD (gastroesophageal reflux disease)   . Hypertension     Orientation RESPIRATION BLADDER Height & Weight     Self  Normal Incontinent Weight: 130 lb (59 kg) Height:  5\' 2"  (157.5 cm)  BEHAVIORAL SYMPTOMS/MOOD NEUROLOGICAL BOWEL NUTRITION STATUS      Continent Diet  AMBULATORY STATUS COMMUNICATION OF NEEDS Skin   Limited Assist Verbally Normal                       Personal Care Assistance Level of Assistance  Bathing,Feeding,Dressing Bathing Assistance: Limited assistance Feeding assistance: Independent Dressing Assistance: Limited assistance     Functional Limitations Info  Sight,Hearing,Speech Sight Info: Adequate Hearing Info: Adequate Speech Info: Adequate    SPECIAL CARE FACTORS FREQUENCY                        Contractures Contractures Info: Not present    Additional Factors Info  Code Status,Allergies,Psychotropic Code Status Info: Full Allergies Info: Flomax (tamsulosin Hcl) , JAWS  Sulfa Antibiotics Psychotropic Info: Citalopram (Celexa), Citalopram (Celexa), Mirtazapine (Remeron),         Current Medications (01/28/2021):  This is the current hospital active medication list Current Facility-Administered Medications  Medication Dose Route Frequency Provider Last Rate Last Admin  . 0.9 %  sodium chloride infusion   Intravenous Continuous Orson Eva, MD 75 mL/hr at 01/28/21 0524 New Bag at 01/28/21 0524  . acetaminophen (TYLENOL) tablet 650 mg  650 mg Oral Q6H PRN Emokpae, Ejiroghene E, MD       Or  . acetaminophen (TYLENOL) suppository 650 mg  650 mg Rectal Q6H PRN Emokpae, Ejiroghene E, MD      . atorvastatin (LIPITOR) tablet 40 mg  40 mg Oral QHS Emokpae, Ejiroghene E, MD   40 mg at 01/27/21 2043  . citalopram (CELEXA) tablet 10 mg  10 mg Oral Daily Emokpae, Ejiroghene E, MD   10 mg at 01/28/21 0939  . divalproex (DEPAKOTE) DR tablet 125 mg  125 mg Oral 2 times per day Emokpae, Ejiroghene E, MD   125 mg at 01/28/21 0939  . divalproex (DEPAKOTE) DR tablet 250 mg  250 mg Oral Q24H Emokpae, Ejiroghene E, MD   250 mg at 01/27/21 1919  . docusate sodium (COLACE) capsule 100 mg  100 mg Oral BID Emokpae, Leanne Chang, MD  100 mg at 01/28/21 0938  . enoxaparin (LOVENOX) injection 40 mg  40 mg Subcutaneous Q24H Emokpae, Ejiroghene E, MD   40 mg at 01/27/21 2043  . famotidine (PEPCID) tablet 10 mg  10 mg Oral BID Emokpae, Ejiroghene E, MD   10 mg at 01/28/21 0938  . feeding supplement (ENSURE ENLIVE / ENSURE PLUS) liquid 237 mL  237 mL Oral BID BM Emokpae, Ejiroghene E, MD   237 mL at 01/28/21 0951  . fluticasone (FLONASE) 50 MCG/ACT nasal spray 2 spray  2 spray Each Nare Daily Emokpae, Ejiroghene E, MD   2 spray at 01/27/21 0941  . LORazepam (ATIVAN) injection 0.5 mg  0.5 mg Intravenous Q6H PRN  Emokpae, Ejiroghene E, MD   0.5 mg at 01/28/21 0523  . melatonin tablet 6 mg  6 mg Oral QHS Adefeso, Oladapo, DO   6 mg at 01/27/21 2043  . mirtazapine (REMERON) tablet 30 mg  30 mg Oral QHS Emokpae, Ejiroghene E, MD   30 mg at 01/27/21 2043  . montelukast (SINGULAIR) tablet 10 mg  10 mg Oral QHS Emokpae, Ejiroghene E, MD   10 mg at 01/27/21 2044  . ondansetron (ZOFRAN) tablet 4 mg  4 mg Oral Q6H PRN Emokpae, Ejiroghene E, MD       Or  . ondansetron (ZOFRAN) injection 4 mg  4 mg Intravenous Q6H PRN Emokpae, Ejiroghene E, MD      . polyethylene glycol (MIRALAX / GLYCOLAX) packet 17 g  17 g Oral Daily PRN Emokpae, Ejiroghene E, MD         Discharge Medications: Please see discharge summary for a list of discharge medications.  Relevant Imaging Results:  Relevant Lab Results:   Additional Information Pt SSN: [id number]  Natasha Bence, LCSW

## 2021-01-28 NOTE — Discharge Summary (Addendum)
Physician Discharge Summary  [name] NUU:[id number] DOB: 1951/01/30 DOA: 01/26/2021  PCP: Rory Percy, MD  Admit date: 01/26/2021 Discharge date: 01/28/2021  [facility] From: [name] Disposition:  [facility]  Recommendations for Outpatient Follow-up:  1. Follow up with PCP in 1-2 weeks 2. Please obtain BMP/CBC in one week    Discharge Condition: Stable CODE STATUS: FULL Diet recommendation: Heart Healthy    Brief/Interim Summary: 70 year old female with history of diabetes mellitus type 2, hypertension, depression, hyperlipidemia, and dementia presenting with altered mental status for 3 to 4 days.  The patient is a poor historian secondary to her dementia.  History is obtained from review of the medical record.  Apparently, the patient normally is able to answer simple questions with yes/no.  However the staff at [facility] noted the patient to be only mumbling incomprehensibly and not responding properly.  As result, the patient was sent for further evaluation.  There is no reports of vomiting, diarrhea, abdominal pain.     ED Course:Stable vitals. WBC 4.4. Lactic acid 3.4 >> 2.Creatinine 1.08. UA suggestive of UTI. Head and chest CT without acute abnormality. EKG sinus rhythm. In the ED, patient remove O2 saturation monitor from her finger and hit RN and stated "I used to repeat just leave me alone."IV ceftriaxone started. Hospitalist to admit.  Discharge Diagnoses:  Acute metabolic encephalopathy -Secondary to UTI and dehydration -Mental status improving--now speaking, pleasantly confused -Continue empiric ceftriaxone -Continue IV fluids -TSH 4.114 -Free T4 3.47 -Q25 956 -Folic acid 6.9 -Personally reviewed chest x-ray--no infiltrates or edema -Personally reviewed EKG--sinus rhythm, nonspecific T wave change  UTI -Continue ceftriaxone pending urine culture -d/c with cefdinir x 4 more days  Lactic acidosis -Secondary to volume depletion -Sepsis ruled  out -Continued IV fluids  AKI -due to volume depletion -baseline creatinine 0.5-0.7 -serum creatinine peaked 1.08 -improved with IVF -serum creatinine 0.58 on day of d/c   Thrombocytopenia -Due to acute medical illness -Serum L87 564 -Folic acid 6.9 -TSH 3.32 -repeat CBC 1 week after d/c  Diabetes mellitus type 2 -Check A1c--5.7  Dementia with behavioral disturbance -At risk for sundowning -Continue Celexa, Remeron, Depakote  Hyperlipidemia -Continue statin  Discharge Instructions   Allergies as of 01/28/2021      Reactions   Flomax [tamsulosin Hcl] Swelling   JAWS   Sulfa Antibiotics Swelling      Medication List    TAKE these medications   atorvastatin 20 MG tablet Commonly known as: LIPITOR Take 40 mg by mouth at bedtime.   cefdinir 300 MG capsule Commonly known as: OMNICEF Take 1 capsule (300 mg total) by mouth every 12 (twelve) hours.   cholecalciferol 25 MCG (1000 UNIT) tablet Commonly known as: VITAMIN D3 Take 2,000 Units by mouth daily.   citalopram 20 MG tablet Commonly known as: CELEXA Take 10 mg by mouth daily.   divalproex 125 MG DR tablet Commonly known as: DEPAKOTE Take 125-250 mg by mouth 3 (three) times daily. 1 tablet at 0800 and 1400, 2 tablets at bedtime (1900)   docusate sodium 100 MG capsule Commonly known as: COLACE Take 100 mg by mouth 2 (two) times daily.   famotidine 10 MG tablet Commonly known as: PEPCID Take 10 mg by mouth 2 (two) times daily.   fluticasone 50 MCG/ACT nasal spray Commonly known as: FLONASE Place 2 sprays into both nostrils daily.   Melatonin 5 MG Caps Take 1 capsule by mouth at bedtime.   mirtazapine 30 MG tablet Commonly known as: REMERON Take 30 mg  by mouth at bedtime.   montelukast 10 MG tablet Commonly known as: SINGULAIR Take 10 mg by mouth at bedtime.   vitamin B-12 500 MCG tablet Commonly known as: CYANOCOBALAMIN Take 500 mcg by mouth daily.       Contact information for  after-discharge care    Destination    [facility] .   Service: Assisted Living Contact information: [address] [place] [address] [phone]                 Allergies  Allergen Reactions  . Flomax [Tamsulosin Hcl] Swelling    JAWS  . Sulfa Antibiotics Swelling    Consultations:  none   Procedures/Studies: DG Chest 1 View  Result Date: 01/26/2021 CLINICAL DATA:  Altered mental status EXAM: CHEST  1 VIEW COMPARISON:  None. FINDINGS: The heart size and mediastinal contours are within normal limits. Both lungs are clear. The visualized skeletal structures are unremarkable. IMPRESSION: No active disease. Electronically Signed   By: Rolm Baptise M.D.   On: 01/26/2021 15:09   CT Head Wo Contrast  Result Date: 01/26/2021 CLINICAL DATA:  Altered mental status for 3 or 4 days. EXAM: CT HEAD WITHOUT CONTRAST TECHNIQUE: Contiguous axial images were obtained from the base of the skull through the vertex without intravenous contrast. COMPARISON:  Head CT scan 09/13/2011. FINDINGS: Brain: No evidence of acute infarction, hemorrhage, hydrocephalus, extra-axial collection or mass lesion/mass effect. Cortical atrophy and chronic microvascular ischemic change are new since the prior study. Vascular: No hyperdense vessel or unexpected calcification. Skull: Intact.  No focal lesion. Sinuses/Orbits: Mucosal thickening is seen in the right frontal and left sphenoid sinuses. Other: None. IMPRESSION: No acute abnormality. Atrophy and chronic microvascular ischemic change are new since the prior CT. Mucosal thickening left sphenoid and right frontal sinuses. Electronically Signed   By: Inge Rise M.D.   On: 01/26/2021 14:58        Discharge Exam: Vitals:   01/27/21 2102 01/28/21 0558  BP: 113/62 120/82  Pulse: (!) 56 66  Resp: 20 20  Temp: 97.6 F (36.4 C)   SpO2: 100% 100%   Vitals:   01/27/21 0634 01/27/21 1422 01/27/21 2102 01/28/21 0558  BP:  (!) 117/53 100/69 113/62 120/82  Pulse: (!) 50 61 (!) 56 66  Resp: 16 18 20 20   Temp:  98.6 F (37 C) 97.6 F (36.4 C)   TempSrc:   Oral   SpO2: 97% 100% 100% 100%  Weight:      Height:        General: Pt is alert, awake, not in acute distress Cardiovascular: RRR, S1/S2 +, no rubs, no gallops Respiratory: bibasilar crackles. No wheeze Abdominal: Soft, NT, ND, bowel sounds + Extremities: no edema, no cyanosis   The results of significant diagnostics from this hospitalization (including imaging, microbiology, ancillary and laboratory) are listed below for reference.    Significant Diagnostic Studies: DG Chest 1 View  Result Date: 01/26/2021 CLINICAL DATA:  Altered mental status EXAM: CHEST  1 VIEW COMPARISON:  None. FINDINGS: The heart size and mediastinal contours are within normal limits. Both lungs are clear. The visualized skeletal structures are unremarkable. IMPRESSION: No active disease. Electronically Signed   By: Rolm Baptise M.D.   On: 01/26/2021 15:09   CT Head Wo Contrast  Result Date: 01/26/2021 CLINICAL DATA:  Altered mental status for 3 or 4 days. EXAM: CT HEAD WITHOUT CONTRAST TECHNIQUE: Contiguous axial images were obtained from the base of the skull through  the vertex without intravenous contrast. COMPARISON:  Head CT scan 09/13/2011. FINDINGS: Brain: No evidence of acute infarction, hemorrhage, hydrocephalus, extra-axial collection or mass lesion/mass effect. Cortical atrophy and chronic microvascular ischemic change are new since the prior study. Vascular: No hyperdense vessel or unexpected calcification. Skull: Intact.  No focal lesion. Sinuses/Orbits: Mucosal thickening is seen in the right frontal and left sphenoid sinuses. Other: None. IMPRESSION: No acute abnormality. Atrophy and chronic microvascular ischemic change are new since the prior CT. Mucosal thickening left sphenoid and right frontal sinuses. Electronically Signed   By: Inge Rise M.D.   On:  01/26/2021 14:58     Microbiology: Recent Results (from the past 240 hour(s))  Blood culture (routine x 2)     Status: None (Preliminary result)   Collection Time: 01/26/21  4:43 PM   Specimen: BLOOD  Result Value Ref Range Status   Specimen Description BLOOD  Final   Special Requests NONE  Final   Culture   Final    NO GROWTH 2 DAYS Performed at [facility], [address] [place], [place] [address]    Report Status PENDING  Incomplete  Blood culture (routine x 2)     Status: None (Preliminary result)   Collection Time: 01/26/21  6:00 PM   Specimen: BLOOD RIGHT ARM  Result Value Ref Range Status   Specimen Description BLOOD RIGHT ARM  Final   Special Requests   Final    BOTTLES DRAWN AEROBIC AND ANAEROBIC Blood Culture adequate volume   Culture   Final    NO GROWTH 2 DAYS Performed at [facility], [address], [place], [place] [address]    Report Status PENDING  Incomplete  Urine culture     Status: Abnormal (Preliminary result)   Collection Time: 01/26/21  7:10 PM   Specimen: Urine, Catheterized  Result Value Ref Range Status   Specimen Description   Final    URINE, CATHETERIZED Performed at [facility], [address] [place], [place] [address]    Special Requests   Final    NONE Performed at [facility], [address] [place], [place] [address]    Culture >=100,000 COLONIES/mL ESCHERICHIA COLI (A)  Final   Report Status PENDING  Incomplete  SARS CORONAVIRUS 2 (Chalese Peach 6-24 HRS) Nasopharyngeal Nasopharyngeal Swab     Status: None   Collection Time: 01/26/21  7:35 PM   Specimen: Nasopharyngeal Swab  Result Value Ref Range Status   SARS Coronavirus 2 NEGATIVE NEGATIVE Final    Comment: (NOTE) SARS-CoV-2 target nucleic acids are NOT DETECTED.  The SARS-CoV-2 RNA is generally detectable in upper and lower respiratory specimens during the acute phase of infection. Negative results do not preclude SARS-CoV-2 infection, do not rule out co-infections with other  pathogens, and should not be used as the sole basis for treatment or other patient management decisions. Negative results must be combined with clinical observations, patient history, and epidemiological information. The expected result is Negative.  Fact Sheet for Patients: SugarRoll.be  Fact Sheet for Healthcare Providers: https://www.woods-mathews.com/  This test is not yet approved or cleared by the [place] FDA and  has been authorized for detection and/or diagnosis of SARS-CoV-2 by FDA under an Emergency Use Authorization (EUA). This EUA will remain  in effect (meaning this test can be used) for the duration of the COVID-19 declaration under Se ction 564(b)(1) of the Act, 21 U.S.C. section 360bbb-3(b)(1), unless the authorization is terminated or revoked sooner.  Performed at [facility],  [address] [place], [place] [address]   MRSA PCR Screening     Status: None   Collection Time: 01/26/21 10:07 PM   Specimen: Nasopharyngeal  Result Value Ref Range Status   MRSA by PCR NEGATIVE NEGATIVE Final    Comment:        The GeneXpert MRSA Assay (FDA approved for NASAL specimens only), is one component of a comprehensive MRSA colonization surveillance program. It is not intended to diagnose MRSA infection nor to guide or monitor treatment for MRSA infections. Performed at [facility], [address] [place], [place] [address]      Labs: Basic Metabolic Panel: Recent Labs  Lab 01/26/21 1431 01/27/21 0533 01/28/21 0602  NA 140 141 139  K 4.3 3.8 3.6  CL 104 110 107  CO2 26 25 25   GLUCOSE 118* 101* 89  BUN 26* 19 9  CREATININE 1.08* 0.80 0.58  CALCIUM 9.3 8.7* 8.7*  MG  --   --  1.6*   Liver Function Tests: Recent Labs  Lab 01/26/21 1431  AST 26  ALT 14  ALKPHOS 61  BILITOT 0.4  PROT 6.9  ALBUMIN 3.7   No results for input(s): LIPASE, AMYLASE in the last 168 hours. No results for input(s):  AMMONIA in the last 168 hours. CBC: Recent Labs  Lab 01/26/21 1431 01/27/21 0533  WBC 4.4 4.8  NEUTROABS 2.1  --   HGB 11.9* 10.2*  HCT 39.5 33.7*  MCV 96.3 95.5  PLT 131* 107*   Cardiac Enzymes: No results for input(s): CKTOTAL, CKMB, CKMBINDEX, TROPONINI in the last 168 hours. BNP: Invalid input(s): POCBNP CBG: No results for input(s): GLUCAP in the last 168 hours.  Time coordinating discharge:  36 minutes  Signed:  Orson Eva, DO [facility] Pager: [phone] 01/28/2021, 2:14 PM

## 2021-01-28 NOTE — Plan of Care (Signed)
  Problem: Acute Rehab PT Goals(only PT should resolve) Goal: Pt Will Go Supine/Side To Sit Outcome: Progressing Flowsheets (Taken 01/28/2021 0921) Pt will go Supine/Side to Sit: with supervision Goal: Pt Will Go Sit To Supine/Side Outcome: Progressing Flowsheets (Taken 01/28/2021 0921) Pt will go Sit to Supine/Side: with supervision Goal: Patient Will Transfer Sit To/From Stand Outcome: Progressing Flowsheets (Taken 01/28/2021 0921) Patient will transfer sit to/from stand: with minimal assist Goal: Pt Will Transfer Bed To Chair/Chair To Bed Outcome: Progressing Flowsheets (Taken 01/28/2021 0921) Pt will Transfer Bed to Chair/Chair to Bed: with min assist  9:21 AM, 01/28/21 Mearl Latin PT, DPT Physical Therapist at [facility]

## 2021-01-29 LAB — URINE CULTURE: Culture: >=100000 — AB

## 2021-01-29 LAB — HEMOGLOBIN A1C
Hgb A1c MFr Bld: 5.8 % — ABNORMAL HIGH (ref 4.8–5.6)
Mean Plasma Glucose: 120 mg/dL

## 2021-01-31 LAB — CULTURE, BLOOD (ROUTINE X 2)
Culture: NO GROWTH
Culture: NO GROWTH
Special Requests: ADEQUATE
Special Requests: ADEQUATE

## 2021-08-27 IMAGING — CR DG CHEST 1V
1 series · 1 of 1 positions shown · non-contrast
Comparison: None.

CLINICAL DATA: Altered mental status

EXAM:
CHEST  1 VIEW

[x chest ap]
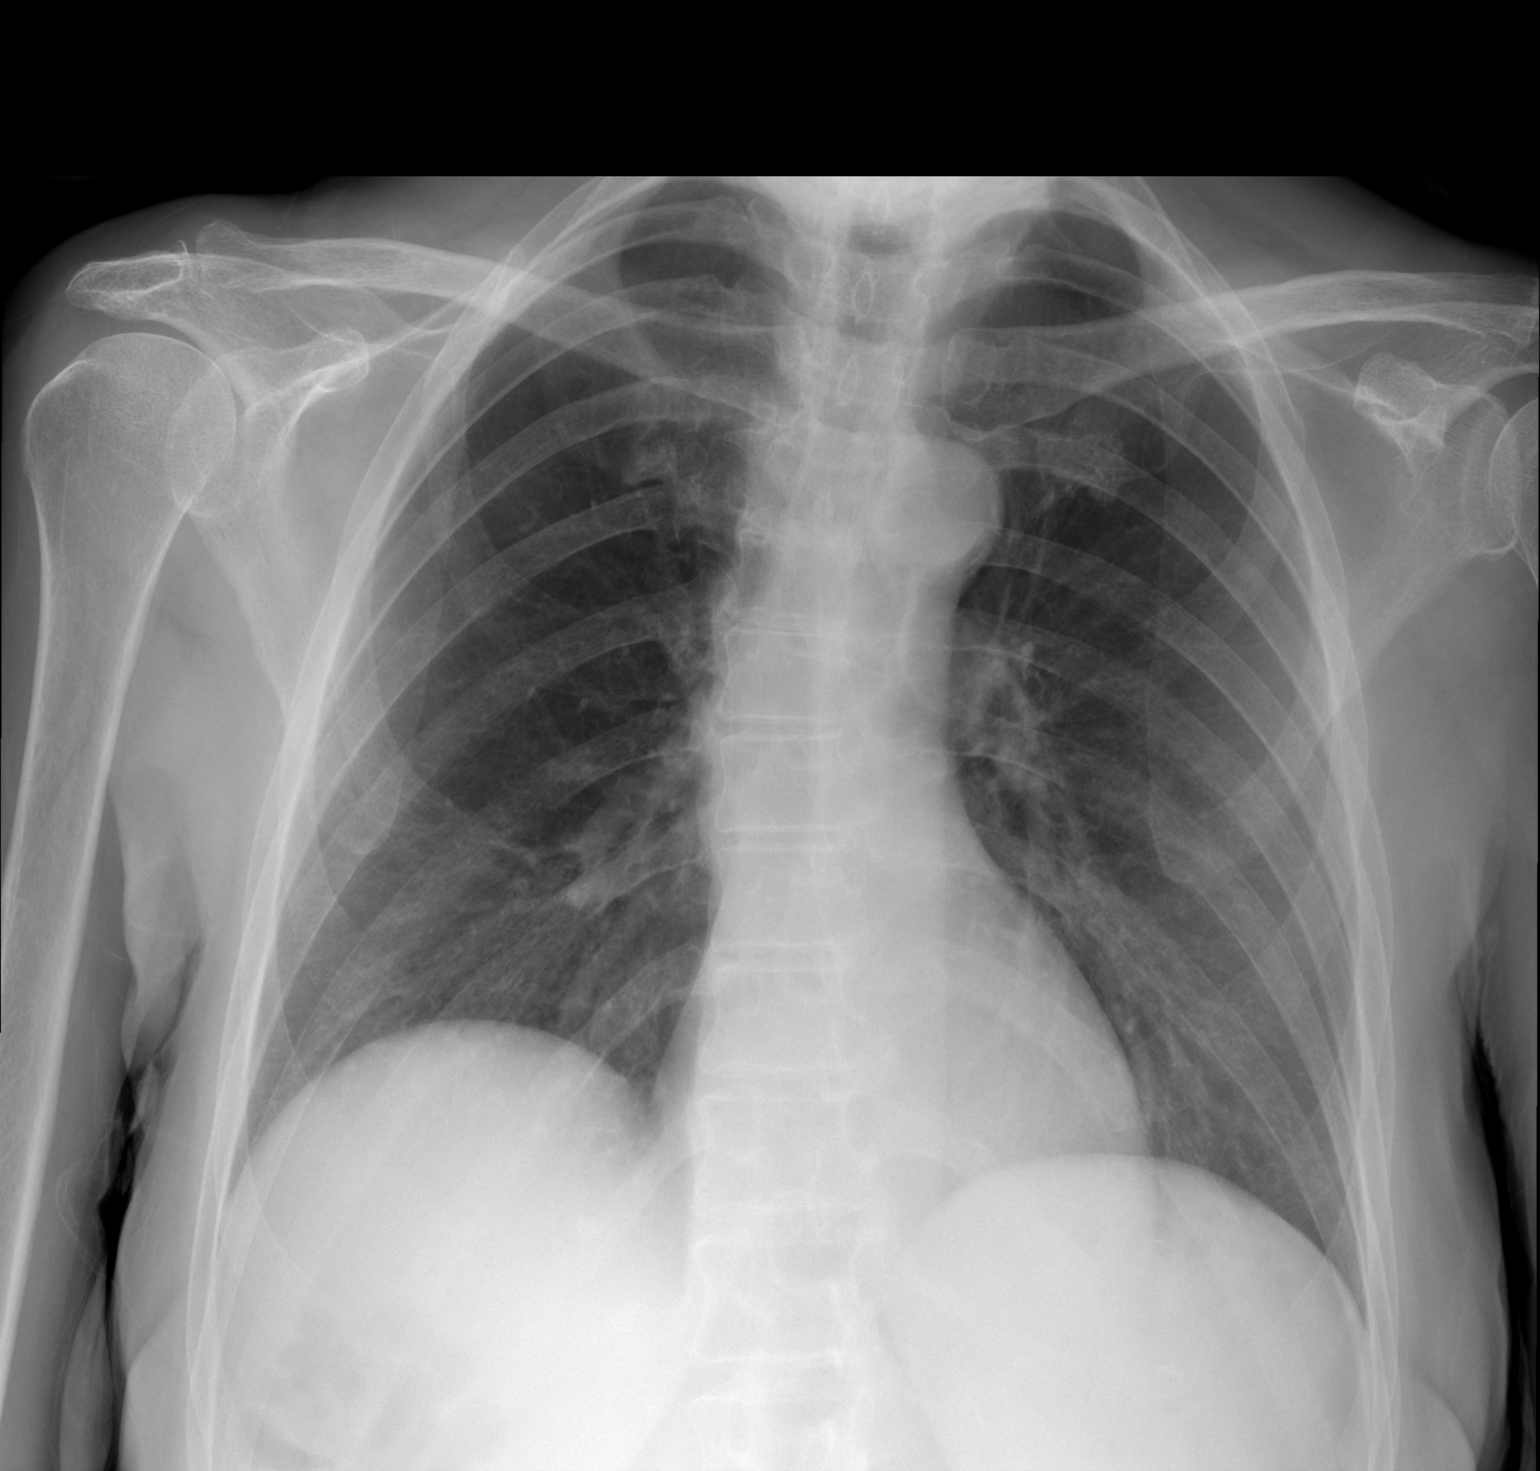

[1 of 1 positions shown; findings below may reference images not displayed]

FINDINGS: The heart size and mediastinal contours are within normal limits.
Both lungs are clear. The visualized skeletal structures are
unremarkable.
IMPRESSION: No active disease.

## 2021-12-08 ENCOUNTER — Encounter (HOSPITAL_COMMUNITY): Payer: Self-pay

## 2021-12-08 ENCOUNTER — Other Ambulatory Visit: Payer: Self-pay

## 2021-12-08 ENCOUNTER — Emergency Department (HOSPITAL_COMMUNITY)
Admission: EM | Admit: 2021-12-08 | Discharge: 2021-12-09 | Disposition: A | Discharge status: Skilled Nursing Facility | Payer: Medicare PPO | Attending: Emergency Medicine | Admitting: Emergency Medicine

## 2021-12-08 DIAGNOSIS — I10 Essential (primary) hypertension: Secondary | ICD-10-CM | POA: Diagnosis not present

## 2021-12-08 DIAGNOSIS — E119 Type 2 diabetes mellitus without complications: Secondary | ICD-10-CM | POA: Insufficient documentation

## 2021-12-08 DIAGNOSIS — S0083XA Contusion of other part of head, initial encounter: Secondary | ICD-10-CM | POA: Diagnosis not present

## 2021-12-08 DIAGNOSIS — W19XXXA Unspecified fall, initial encounter: Secondary | ICD-10-CM

## 2021-12-08 DIAGNOSIS — Z79899 Other long term (current) drug therapy: Secondary | ICD-10-CM | POA: Diagnosis not present

## 2021-12-08 DIAGNOSIS — W06XXXA Fall from bed, initial encounter: Secondary | ICD-10-CM | POA: Insufficient documentation

## 2021-12-08 DIAGNOSIS — F0390 Unspecified dementia without behavioral disturbance: Secondary | ICD-10-CM | POA: Insufficient documentation

## 2021-12-08 DIAGNOSIS — Y92129 Unspecified place in nursing home as the place of occurrence of the external cause: Secondary | ICD-10-CM | POA: Insufficient documentation

## 2021-12-08 DIAGNOSIS — S0081XA Abrasion of other part of head, initial encounter: Secondary | ICD-10-CM

## 2021-12-08 DIAGNOSIS — S0990XA Unspecified injury of head, initial encounter: Secondary | ICD-10-CM | POA: Diagnosis present

## 2021-12-08 NOTE — ED Triage Notes (Signed)
Pt to ED from [facility] via [facility] c/o mechanical fall from bed. Pt resides in dementia unit. Not able to answer questions. Abrasion to forehead from the fall. Not on blood thinners. ?

## 2021-12-08 NOTE — ED Notes (Signed)
Per EMS, pt room mate is covid positive. However, pt has not had a positive test.  ?

## 2021-12-08 NOTE — ED Provider Notes (Signed)
?[name] ?Provider Note ? ? ?CSN: [id number] ?Arrival date & time: 12/08/21  2328 ? ?  ? ?History ? ?Chief Complaint  ?Patient presents with  ? Fall  ? ? ?[name] is a 71 y.o. female. ? ?The history is provided by the nursing home. The history is limited by the condition of the patient (Dementia).  ?Fall ?He has history of hypertension, diabetes, hyperlipidemia, dementia and comes in after falling from her bed.  She did suffer some bruising to her forehead.  She is not on any anticoagulants.  No other injury was appreciated. ?  ?Home Medications ?Prior to Admission medications   ?Medication Sig Start Date End Date Taking? Authorizing Provider  ?atorvastatin (LIPITOR) 20 MG tablet Take 40 mg by mouth at bedtime.    Verlee Monte, MD  ?cefdinir (OMNICEF) 300 MG capsule Take 1 capsule (300 mg total) by mouth every 12 (twelve) hours. 01/28/21   Orson Eva, MD  ?cholecalciferol (VITAMIN D3) 25 MCG (1000 UNIT) tablet Take 2,000 Units by mouth daily.    [provider]  ?citalopram (CELEXA) 20 MG tablet Take 10 mg by mouth daily.    [provider]  ?divalproex (DEPAKOTE) 125 MG DR tablet Take 125-250 mg by mouth 3 (three) times daily. 1 tablet at 0800 and 1400, 2 tablets at bedtime (1900) 01/13/21   [provider]  ?docusate sodium (COLACE) 100 MG capsule Take 100 mg by mouth 2 (two) times daily.    [provider]  ?famotidine (PEPCID) 10 MG tablet Take 10 mg by mouth 2 (two) times daily.    [provider]  ?fluticasone (FLONASE) 50 MCG/ACT nasal spray Place 2 sprays into both nostrils daily. 01/26/21   [provider]  ?Melatonin 5 MG CAPS Take 1 capsule by mouth at bedtime.    [provider]  ?mirtazapine (REMERON) 30 MG tablet Take 30 mg by mouth at bedtime.    [provider]  ?montelukast (SINGULAIR) 10 MG tablet Take 10 mg by mouth at bedtime.    [provider]  ?vitamin B-12 (CYANOCOBALAMIN) 500 MCG tablet  Take 500 mcg by mouth daily.    [provider]  ?   ? ?Allergies    ?Flomax [tamsulosin hcl] and Sulfa antibiotics   ? ?Review of Systems   ?Review of Systems  ?Unable to perform ROS: Dementia  ? ?Physical Exam ?Updated Vital Signs ?BP 124/69   Pulse (!) 104   Temp 100.3 ?F (37.9 ?C)   Resp 18   Ht '5\' 2"'$  (1.575 m)   Wt 60 kg   SpO2 95%   BMI 24.19 kg/m?  ?Physical Exam ?Vitals and nursing note reviewed.  ?71 year old female, resting comfortably and in no acute distress. Vital signs are significant for borderline elevated heart rate. Oxygen saturation is 95%, which is normal. ?Head is normocephalic.  Minor contusion/abrasion noted on the right side of the forehead. PERRLA, EOMI. Oropharynx is clear. ?Neck is nontender without adenopathy or JVD. ?Back is nontender and there is no CVA tenderness. ?Lungs are clear without rales, wheezes, or rhonchi. ?Chest is nontender. ?Heart has regular rate and rhythm without murmur. ?Abdomen is soft, flat, nontender. ?Extremities have no cyanosis or edema, full range of motion is present. ?Skin is warm and dry without rash. ?Neurologic: Awake and alert, oriented to person but not place or time.  Cranial nerves are intact.  Moves all extremities equally. ? ?ED Results / Procedures / Treatments   ? ?  Radiology ?CT Head Wo Contrast ? ?Result Date: 12/09/2021 ?CLINICAL DATA:  Status post fall. EXAM: CT HEAD WITHOUT CONTRAST TECHNIQUE: Contiguous axial images were obtained from the base of the skull through the vertex without intravenous contrast. RADIATION DOSE REDUCTION: This exam was performed according to the departmental dose-optimization program which includes automated exposure control, adjustment of the mA and/or kV according to patient size and/or use of iterative reconstruction technique. COMPARISON:  January 26, 2021 FINDINGS: Brain: There is mild cerebral atrophy with widening of the extra-axial spaces and ventricular dilatation. There are areas of decreased  attenuation within the white matter tracts of the supratentorial brain, consistent with microvascular disease changes. Vascular: No hyperdense vessel or unexpected calcification. Skull: Normal. Negative for fracture or focal lesion. Sinuses/Orbits: There is moderate severity bilateral ethmoid sinus mucosal thickening. A small left maxillary sinus air-fluid level is also seen. Other: None. IMPRESSION: 1. No acute intracranial abnormality. 2. Mild cerebral atrophy and microvascular disease changes of the supratentorial brain. 3. Bilateral ethmoid sinus and left maxillary sinus disease. Electronically Signed   By: Virgina Norfolk M.D.   On: 12/09/2021 03:29  ? ?CT Cervical Spine Wo Contrast ? ?Result Date: 12/09/2021 ?CLINICAL DATA:  Status post fall. EXAM: CT CERVICAL SPINE WITHOUT CONTRAST TECHNIQUE: Multidetector CT imaging of the cervical spine was performed without intravenous contrast. Multiplanar CT image reconstructions were also generated. RADIATION DOSE REDUCTION: This exam was performed according to the departmental dose-optimization program which includes automated exposure control, adjustment of the mA and/or kV according to patient size and/or use of iterative reconstruction technique. COMPARISON:  None. FINDINGS: Alignment: Normal. Skull base and vertebrae: No acute fracture. No primary bone lesion or focal pathologic process. Soft tissues and spinal canal: No prevertebral fluid or swelling. No visible canal hematoma. Disc levels: Marked severity endplate sclerosis and moderate severity anterior osteophyte formation are seen at the levels of C3-C4, C4-C5, C5-C6, C6-C7 and C7-T1. Marked severity intervertebral disc space narrowing is seen at C4-C5, C5-C6, C6-C7 and C7-T1. Mild, bilateral multilevel facet joint hypertrophy is noted. Upper chest: There is mild left apical scarring and/or atelectasis. Other: None. IMPRESSION: Marked severity multilevel degenerative changes without evidence of an acute  fracture or subluxation. Electronically Signed   By: Virgina Norfolk M.D.   On: 12/09/2021 03:28   ? ?Procedures ?Procedures  ? ? ?Medications Ordered in ED ?Medications - No data to display ? ?ED Course/ Medical Decision Making/ A&P ?  ?                        ?Medical Decision Making ?Amount and/or Complexity of Data Reviewed ?Radiology: ordered. ? ? ?Fall from bed with forehead contusion/abrasion.  She will be sent for CT of head and cervical spine to rule out occult injury. ? ?CT scan showed no evidence of intracranial injury, no cervical spine fracture.  I have independently viewed the images, and agree with radiologist's interpretation.  She is discharged back to her skilled nursing facility. ? ?Final Clinical Impression(s) / ED Diagnoses ?Final diagnoses:  ?Fall at nursing home, initial encounter  ?Contusion of forehead, initial encounter  ?Abrasion of forehead, initial encounter  ? ? ?Rx / DC Orders ?ED Discharge Orders   ? ? None  ? ?  ? ? ?  ?Delora Fuel, MD ?46/80/32 0407 ? ?

## 2021-12-09 ENCOUNTER — Emergency Department (HOSPITAL_COMMUNITY): Payer: Medicare PPO

## 2021-12-09 NOTE — ED Notes (Signed)
Pt is being transported to CT.

## 2021-12-09 NOTE — ED Notes (Signed)
Spoke with Remo Lipps from [facility]. Sending transport to pick pt up after 7am ?

## 2021-12-09 NOTE — ED Notes (Signed)
This nurse spoke with staff at [facility] and they verified that pt had tested Positive for Covid yesterday- pt roommate has Covid, pt was not having symptoms per staff.  ?

## 2021-12-09 NOTE — ED Notes (Signed)
Pt is placed on a posey sitter at this time.  ?

## 2022-05-14 ENCOUNTER — Emergency Department (HOSPITAL_COMMUNITY)
Admission: EM | Admit: 2022-05-14 | Discharge: 2022-05-14 | Disposition: A | Discharge status: Skilled Nursing Facility | Payer: Medicare PPO | Attending: Emergency Medicine | Admitting: Emergency Medicine

## 2022-05-14 ENCOUNTER — Emergency Department (HOSPITAL_COMMUNITY): Payer: Medicare PPO

## 2022-05-14 ENCOUNTER — Encounter (HOSPITAL_COMMUNITY): Payer: Self-pay | Admitting: Emergency Medicine

## 2022-05-14 DIAGNOSIS — I10 Essential (primary) hypertension: Secondary | ICD-10-CM | POA: Insufficient documentation

## 2022-05-14 DIAGNOSIS — S0031XA Abrasion of nose, initial encounter: Secondary | ICD-10-CM | POA: Insufficient documentation

## 2022-05-14 DIAGNOSIS — S0990XA Unspecified injury of head, initial encounter: Secondary | ICD-10-CM | POA: Diagnosis present

## 2022-05-14 DIAGNOSIS — F0390 Unspecified dementia without behavioral disturbance: Secondary | ICD-10-CM | POA: Insufficient documentation

## 2022-05-14 DIAGNOSIS — Z23 Encounter for immunization: Secondary | ICD-10-CM | POA: Diagnosis not present

## 2022-05-14 DIAGNOSIS — E119 Type 2 diabetes mellitus without complications: Secondary | ICD-10-CM | POA: Diagnosis not present

## 2022-05-14 DIAGNOSIS — S0093XA Contusion of unspecified part of head, initial encounter: Secondary | ICD-10-CM | POA: Insufficient documentation

## 2022-05-14 DIAGNOSIS — W19XXXA Unspecified fall, initial encounter: Secondary | ICD-10-CM | POA: Diagnosis not present

## 2022-05-14 MED ORDER — DOUBLE ANTIBIOTIC 500-10000 UNIT/GM EX OINT
TOPICAL_OINTMENT | Freq: Once | CUTANEOUS | Status: AC
  Administered 2022-05-14: 3 via TOPICAL
  Filled 2022-05-14: qty 3

## 2022-05-14 MED ORDER — TETANUS-DIPHTH-ACELL PERTUSSIS 5-2.5-18.5 LF-MCG/0.5 IM SUSY
0.5000 mL | PREFILLED_SYRINGE | Freq: Once | INTRAMUSCULAR | Status: AC
  Administered 2022-05-14: 0.5 mL via INTRAMUSCULAR
  Filled 2022-05-14: qty 0.5

## 2022-05-14 NOTE — ED Notes (Signed)
Wound care completed reference wound on patient's forehead. Anitbiotic ointment applied and bandaged with gauze.

## 2022-05-14 NOTE — ED Provider Notes (Signed)
[facility] EMERGENCY DEPARTMENT Provider Note   CSN: [id number] Arrival date & time: 05/14/22  8295     History  Chief Complaint  Patient presents with   Lytle Michaels    [name] is a 71 y.o. female.  Patient as above with significant medical history as below, including depression, gerd, hld, t2dm who presents to the ED with complaint of fall. Pt poor historian, reported fall pta w/ head injury, no thinners.  She resides at [facility] in [place].  Per facility patient was at baseline on their assessment.  On my assessment patient has no significant complaints.  No nausea or vomiting, no chest pain or dyspnea.  She has no headache.  Similar presentation 4/13  History limited secondary to dementia    Past Medical History:  Diagnosis Date   Arthritis    fingers   Depression    GERD (gastroesophageal reflux disease)    Headache(784.0)    History of hypertension    NO MEDS NOW   History of kidney stones    Hydronephrosis, right    Hyperlipidemia    Renal calculi    LEFT   Type 2 diabetes mellitus (HCC)    Urge urinary incontinence    Wears glasses     Past Surgical History:  Procedure Laterality Date   ANTERIOR AND POSTERIOR REPAIR  07/13/2011   Procedure: ANTERIOR (CYSTOCELE) AND POSTERIOR REPAIR (RECTOCELE);  Surgeon: Arloa Koh;  Location: [facility];  Service: Gynecology;  Laterality: N/A;   BILIARY STENT PLACEMENT  08/22/2011   Procedure: BILIARY STENT PLACEMENT;  Surgeon: Missy Sabins, MD;  Location: [facility];  Service: Endoscopy;  Laterality: N/A;  71fx 5cm stent for bile leak. placed in [facility]   BLADDER SUSPENSION  07/13/2011   Procedure: TRANSVAGINAL TAPE (TVT) PROCEDURE;  Surgeon: BArloa Koh  [facility];  Service: Gynecology;  Laterality: N/A;   CYSTO/ RETROGRADE PYELOGRAM/ RIGHT URETERAL DILATION  10-28-2008   CYSTO/ RIGHT URETERAL BALLOON DILATION/ RIGHT URETEROSCOPIC STONE EXTRACTIO/ STENT  11-29-2010   CYSTO/ URETEROSCOPIC LASER LITHO OF  STONES  2009  &  09-30-2010   CYSTOSCOPY  07/13/2011   Procedure: CYSTOSCOPY;  Surgeon: BArloa Koh  [facility];  Service: Gynecology;  Laterality: N/A;   CYSTOSCOPY/RETROGRADE/URETEROSCOPY/STONE EXTRACTION WITH BASKET Bilateral 05/05/2013   Procedure: CYSTOSCOPY/RETROGRADE/URETEROSCOPY/STONE EXTRACTION WITH BASKET   Procedure:Cysto, Right Retrograde Pyelogram, Left Ureteroscopy with Holmium Laser, Extraction of Stones;  Surgeon: SFranchot Gallo MD;  [facility]  Service: Urology;  Laterality: Bilateral;   ERCP  08/22/2011   Procedure: ENDOSCOPIC RETROGRADE CHOLANGIOPANCREATOGRAPHY (ERCP);  Surgeon: JMissy Sabins MD;  [facility];  Service: Endoscopy;  Laterality: N/A;   ERCP  12/15/2011   Procedure: ENDOSCOPIC RETROGRADE CHOLANGIOPANCREATOGRAPHY (ERCP);  Surgeon: JMissy Sabins MD;  [facility];  Service: Endoscopy;  Laterality: N/A;  stent removal/mod. sedation   EXTRACORPOREAL SHOCK WAVE LITHOTRIPSY Left 04-17-2011   HOLMIUM LASER APPLICATION Left 96/09/1306  Procedure: HOLMIUM LASER APPLICATION;  Surgeon: SFranchot Gallo MD;  [facility]  Service: Urology;  Laterality: Left;   LAPAROSCOPIC ASSISTED VAGINAL HYSTERECTOMY  07/13/2011   Procedure: LAPAROSCOPIC ASSISTED VAGINAL HYSTERECTOMY;  Surgeon: BArloa Koh  [facility];  Service: Gynecology;  Laterality: N/A;   LAPAROSCOPIC CHOLECYSTECTOMY  08-17-2011   SALPINGOOPHORECTOMY  07/13/2011   Procedure: SALPINGO OOPHERECTOMY;  Surgeon: BArloa Koh  [facility];  Service: Gynecology;  Laterality: Bilateral;     The history is provided  by the patient. The history is limited by the condition of the patient.  Fall Pertinent negatives include no chest pain, no abdominal pain, no headaches and no shortness of breath.       Home Medications Prior to Admission medications   Medication Sig Start Date End Date Taking? Authorizing Provider   atorvastatin (LIPITOR) 20 MG tablet Take 40 mg by mouth at bedtime.    Verlee Monte, MD  cefdinir (OMNICEF) 300 MG capsule Take 1 capsule (300 mg total) by mouth every 12 (twelve) hours. 01/28/21   Orson Eva, MD  cholecalciferol (VITAMIN D3) 25 MCG (1000 UNIT) tablet Take 2,000 Units by mouth daily.    [provider]  citalopram (CELEXA) 20 MG tablet Take 10 mg by mouth daily.    [provider]  divalproex (DEPAKOTE) 125 MG DR tablet Take 125-250 mg by mouth 3 (three) times daily. 1 tablet at 0800 and 1400, 2 tablets at bedtime (1900) 01/13/21   [provider]  docusate sodium (COLACE) 100 MG capsule Take 100 mg by mouth 2 (two) times daily.    [provider]  famotidine (PEPCID) 10 MG tablet Take 10 mg by mouth 2 (two) times daily.    [provider]  fluticasone (FLONASE) 50 MCG/ACT nasal spray Place 2 sprays into both nostrils daily. 01/26/21   [provider]  Melatonin 5 MG CAPS Take 1 capsule by mouth at bedtime.    [provider]  mirtazapine (REMERON) 30 MG tablet Take 30 mg by mouth at bedtime.    [provider]  montelukast (SINGULAIR) 10 MG tablet Take 10 mg by mouth at bedtime.    [provider]  vitamin B-12 (CYANOCOBALAMIN) 500 MCG tablet Take 500 mcg by mouth daily.    [provider]      Allergies    Flomax [tamsulosin hcl] and Sulfa antibiotics    Review of Systems   Review of Systems  Unable to perform ROS: Dementia  Constitutional:  Negative for chills and fever.  Respiratory:  Negative for shortness of breath.   Cardiovascular:  Negative for chest pain.  Gastrointestinal:  Negative for abdominal pain, nausea and vomiting.  Skin:  Positive for wound.  Neurological:  Negative for headaches.    Physical Exam Updated Vital Signs BP 105/71   Pulse 88   Temp 98 F (36.7 C)   Resp 10   Ht '5\' 2"'$  (1.575 m)   Wt 60 kg   SpO2 94%   BMI 24.19 kg/m  Physical Exam Vitals and  nursing note reviewed.  Constitutional:      General: She is not in acute distress.    Appearance: Normal appearance.     Comments: frail  HENT:     Head: Normocephalic. No raccoon eyes, Battle's sign, right periorbital erythema or left periorbital erythema.     Jaw: There is normal jaw occlusion. No trismus.      Comments: Abrasion forehead, nasal bridge No drooling stridor or trismus    Right Ear: External ear normal.     Left Ear: External ear normal.     Nose: Nose normal.     Mouth/Throat:     Mouth: Mucous membranes are moist.  Eyes:     General: No scleral icterus.       Right eye: No discharge.        Left eye: No discharge.  Neck:     Trachea: Trachea normal. No tracheal deviation.  Cardiovascular:  Rate and Rhythm: Normal rate and regular rhythm.     Pulses: Normal pulses.          Radial pulses are 2+ on the right side and 2+ on the left side.       Dorsalis pedis pulses are 2+ on the right side and 2+ on the left side.     Heart sounds: Normal heart sounds.  Pulmonary:     Effort: Pulmonary effort is normal. No respiratory distress.     Breath sounds: Normal breath sounds.  Abdominal:     General: Abdomen is flat.     Tenderness: There is no abdominal tenderness.  Musculoskeletal:        General: Normal range of motion.     Cervical back: Full passive range of motion without pain and normal range of motion.     Right lower leg: No edema.     Left lower leg: No edema.     Comments: No midline spinous process tenderness to palpation or percussion, no crepitus or step-off.   Pelvis stable to ap pressure  No pain to LE w/ log roll  Skin:    General: Skin is warm and dry.     Capillary Refill: Capillary refill takes less than 2 seconds.  Neurological:     Mental Status: She is alert.     Comments: Moving all extremities freely   Psychiatric:        Mood and Affect: Mood normal.        Behavior: Behavior normal.     ED Results / Procedures /  Treatments   Labs (all labs ordered are listed, but only abnormal results are displayed) Labs Reviewed - No data to display  EKG EKG Interpretation  Date/Time:  Sunday May 14 2022 06:27:54 EDT Ventricular Rate:  90 PR Interval:  151 QRS Duration: 80 QT Interval:  356 QTC Calculation: 436 R Axis:   69 Text Interpretation: Sinus rhythm Interpretation limited secondary to artifact Confirmed by Ripley Fraise [phone]) on 05/14/2022 6:29:18 AM  Radiology CT Cervical Spine Wo Contrast  Result Date: 05/14/2022 CLINICAL DATA:  Trauma. Patient found on floor next to bed this morning. EXAM: CT CERVICAL SPINE WITHOUT CONTRAST TECHNIQUE: Multidetector CT imaging of the cervical spine was performed without intravenous contrast. Multiplanar CT image reconstructions were also generated. RADIATION DOSE REDUCTION: This exam was performed according to the departmental dose-optimization program which includes automated exposure control, adjustment of the mA and/or kV according to patient size and/or use of iterative reconstruction technique. COMPARISON:  CT of the cervical spine 12/09/2021 FINDINGS: Alignment: No significant listhesis is present. Mild reversal of the normal cervical lordosis is similar the prior study. Skull base and vertebrae: Craniocervical junction is within normal limits. Vertebral body heights are normal. No acute fractures are present. Soft tissues and spinal canal: No prevertebral fluid or swelling. No visible canal hematoma. Disc levels: Chronic degenerative foraminal narrowing is again noted, worse on the left at C3-4, C4-5 C6-7 and C7-T1 and worse on the right at C5-6. Upper chest: The lung apices are clear. Thoracic inlet is within normal limits. IMPRESSION: 1. No acute fracture or traumatic subluxation. 2. Multilevel degenerative changes as described. Electronically Signed   By: San Morelle M.D.   On: 05/14/2022 07:38   CT Head Wo Contrast  Result Date:  05/14/2022 CLINICAL DATA:  Head trauma, moderate to severe. Patient found on floor next to her bed this morning. EXAM: CT HEAD WITHOUT CONTRAST TECHNIQUE: Contiguous axial images were  obtained from the base of the skull through the vertex without intravenous contrast. RADIATION DOSE REDUCTION: This exam was performed according to the departmental dose-optimization program which includes automated exposure control, adjustment of the mA and/or kV according to patient size and/or use of iterative reconstruction technique. COMPARISON:  CT head without contrast 12/09/2021 FINDINGS: Brain: No acute infarct, hemorrhage, or mass lesion is present. Moderate atrophy and white matter disease is stable. The ventricles are proportionate to the degree of atrophy. No significant extraaxial fluid collection is present. The brainstem and cerebellum are within normal limits. Vascular: No hyperdense vessel or unexpected calcification. Skull: Minimal soft tissue swelling is present in the supraorbital scalp just to the left of midline. No significant laceration is present. No underlying foreign body or fracture is present. Calvarium is intact. No focal lytic or blastic lesions are present. The extracranial soft tissues are otherwise within normal limits. Sinuses/Orbits: The paranasal sinuses and mastoid air cells are clear. The globes and orbits are within normal limits. IMPRESSION: 1. Minimal soft tissue swelling in the supraorbital scalp just to the left of midline without underlying fracture or foreign body. 2. Stable atrophy and white matter disease. This likely reflects the sequela of chronic microvascular ischemia. 3. No acute intracranial abnormality. Electronically Signed   By: San Morelle M.D.   On: 05/14/2022 07:34    Procedures Procedures    Medications Ordered in ED Medications  Tdap (BOOSTRIX) injection 0.5 mL (0.5 mLs Intramuscular Given 05/14/22 0855)    ED Course/ Medical Decision Making/ A&P                            Medical Decision Making Amount and/or Complexity of Data Reviewed Radiology: ordered.  Risk Prescription drug management.   This patient presents to the ED with chief complaint(s) of fall with pertinent past medical history of dementia which further complicates the presenting complaint. The complaint involves an extensive differential diagnosis and also carries with it a high risk of complications and morbidity.    Differential diagnoses for head trauma includes subdural hematoma, epidural hematoma, acute concussion, traumatic subarachnoid hemorrhage, cerebral contusions, etc.  . Serious etiologies were considered.   The initial plan is to imaging   Additional history obtained: Additional history obtained from  na Records reviewed  prior ED visits, home medications  Independent labs interpretation:  The following labs were independently interpreted: Not applicable  Independent visualization of imaging: - I independently visualized the following imaging with scope of interpretation limited to determining acute life threatening conditions related to emergency care: CT head, CT cervical spine, which shows soft tissue swelling, otherwise no acute process  Cardiac monitoring was reviewed and interpreted by myself which shows na  Treatment and Reassessment: Boostrix Wound care  Symptoms unchanged although she has no complaints  Consultation: - Consulted or discussed management/test interpretation w/ external professional: na  Consideration for admission or further workup: Admission was considered   71 year old female with dementia, reports ED after falling from bed.  Unwitnessed fall.  History of dementia.  She has no acute complaints on my assessment.  CT imaging reviewed does not demonstrate evidence of acute intracranial injury or cervical spine injury.Marland Kitchen  EKG was stable.  Tetanus updated.  Wound care provided.  Patient does not report any discomfort, she has  no complaints but does have dementia.  per nursing facility patient at apparent baseline.  The patient improved significantly and was discharged in stable condition. Detailed discussions  were had with the patient regarding current findings, and need for close f/u with PCP or on call doctor. The patient has been instructed to return immediately if the symptoms worsen in any way for re-evaluation. Patient verbalized understanding and is in agreement with current care plan. All questions answered prior to discharge.    Social Determinants of health: Social History   Tobacco Use   Smoking status: Never   Smokeless tobacco: Never  Substance Use Topics   Alcohol use: No   Drug use: No            Final Clinical Impression(s) / ED Diagnoses Final diagnoses:  Minor head injury, initial encounter  Contusion of head, unspecified part of head, initial encounter    Rx / DC Orders ED Discharge Orders     None         Jeanell Sparrow, DO 05/14/22 0913

## 2022-05-14 NOTE — ED Notes (Signed)
Pt picked up by [facility] from [facility].

## 2022-05-14 NOTE — ED Provider Triage Note (Signed)
Emergency Medicine Provider Triage Evaluation Note  [name] , a 71 y.o. female  was evaluated in triage.  Pt complains of fall and head injury.  History limited by dementia.  Review of Systems  Positive: Headache and neck pain    Physical Exam  Ht 1.575 m ('5\' 2"'$ )   Wt 60 kg   BMI 24.19 kg/m  Gen:   Awake, no distress   Resp:  Normal effort  MSK:   Moves extremities without difficulty  Other:  Abrasion to face/forehead C-spine tenderness noted  Medical Decision Making  Medically screening exam initiated at 6:27 AM.  Appropriate orders placed.  [name] was informed that the remainder of the evaluation will be completed by another provider, this initial triage assessment does not replace that evaluation, and the importance of remaining in the ED until their evaluation is complete.     Ripley Fraise, MD 05/14/22 [phone]

## 2022-05-14 NOTE — ED Notes (Signed)
Pt pleasantly confused.

## 2022-05-14 NOTE — ED Triage Notes (Signed)
Pt found in the floor next to her bed at [facility] this morning. Pt noted to have small abrasion to forehead. Pt at her baseline per facility, pt denies any complaints at this time.

## 2022-05-14 NOTE — Discharge Instructions (Signed)
It was a pleasure caring for you today in the emergency department. ° °Please return to the emergency department for any worsening or worrisome symptoms. ° ° °

## 2022-07-01 ENCOUNTER — Encounter (HOSPITAL_COMMUNITY): Payer: Self-pay

## 2022-07-01 ENCOUNTER — Emergency Department (HOSPITAL_COMMUNITY): Payer: Medicare PPO

## 2022-07-01 ENCOUNTER — Inpatient Hospital Stay (HOSPITAL_COMMUNITY)
Admission: EM | Admit: 2022-07-01 | Discharge: 2022-07-15 | DRG: 481 | Disposition: A | Discharge status: Skilled Nursing Facility | Payer: Medicare PPO | Source: Skilled Nursing Facility | Attending: Internal Medicine | Admitting: Internal Medicine

## 2022-07-01 ENCOUNTER — Other Ambulatory Visit: Payer: Self-pay

## 2022-07-01 ENCOUNTER — Inpatient Hospital Stay (HOSPITAL_COMMUNITY): Payer: Medicare PPO

## 2022-07-01 DIAGNOSIS — Z515 Encounter for palliative care: Secondary | ICD-10-CM

## 2022-07-01 DIAGNOSIS — S72141A Displaced intertrochanteric fracture of right femur, initial encounter for closed fracture: Principal | ICD-10-CM | POA: Diagnosis present

## 2022-07-01 DIAGNOSIS — N289 Disorder of kidney and ureter, unspecified: Secondary | ICD-10-CM | POA: Diagnosis not present

## 2022-07-01 DIAGNOSIS — N390 Urinary tract infection, site not specified: Secondary | ICD-10-CM | POA: Diagnosis not present

## 2022-07-01 DIAGNOSIS — S72001A Fracture of unspecified part of neck of right femur, initial encounter for closed fracture: Secondary | ICD-10-CM | POA: Diagnosis present

## 2022-07-01 DIAGNOSIS — R296 Repeated falls: Secondary | ICD-10-CM | POA: Diagnosis present

## 2022-07-01 DIAGNOSIS — Z7189 Other specified counseling: Secondary | ICD-10-CM | POA: Diagnosis not present

## 2022-07-01 DIAGNOSIS — E876 Hypokalemia: Secondary | ICD-10-CM | POA: Diagnosis not present

## 2022-07-01 DIAGNOSIS — E785 Hyperlipidemia, unspecified: Secondary | ICD-10-CM | POA: Diagnosis present

## 2022-07-01 DIAGNOSIS — W19XXXA Unspecified fall, initial encounter: Secondary | ICD-10-CM | POA: Diagnosis not present

## 2022-07-01 DIAGNOSIS — E119 Type 2 diabetes mellitus without complications: Secondary | ICD-10-CM | POA: Diagnosis not present

## 2022-07-01 DIAGNOSIS — E1165 Type 2 diabetes mellitus with hyperglycemia: Secondary | ICD-10-CM | POA: Diagnosis present

## 2022-07-01 DIAGNOSIS — N179 Acute kidney failure, unspecified: Secondary | ICD-10-CM | POA: Diagnosis not present

## 2022-07-01 DIAGNOSIS — F32A Depression, unspecified: Secondary | ICD-10-CM | POA: Diagnosis present

## 2022-07-01 DIAGNOSIS — E870 Hyperosmolality and hypernatremia: Secondary | ICD-10-CM | POA: Insufficient documentation

## 2022-07-01 DIAGNOSIS — R627 Adult failure to thrive: Secondary | ICD-10-CM | POA: Diagnosis not present

## 2022-07-01 DIAGNOSIS — K219 Gastro-esophageal reflux disease without esophagitis: Secondary | ICD-10-CM | POA: Diagnosis present

## 2022-07-01 DIAGNOSIS — W1830XA Fall on same level, unspecified, initial encounter: Secondary | ICD-10-CM | POA: Diagnosis present

## 2022-07-01 DIAGNOSIS — N3000 Acute cystitis without hematuria: Secondary | ICD-10-CM | POA: Diagnosis not present

## 2022-07-01 DIAGNOSIS — F0390 Unspecified dementia without behavioral disturbance: Secondary | ICD-10-CM | POA: Diagnosis not present

## 2022-07-01 DIAGNOSIS — I10 Essential (primary) hypertension: Secondary | ICD-10-CM | POA: Diagnosis present

## 2022-07-01 DIAGNOSIS — F03C0 Unspecified dementia, severe, without behavioral disturbance, psychotic disturbance, mood disturbance, and anxiety: Secondary | ICD-10-CM | POA: Diagnosis present

## 2022-07-01 DIAGNOSIS — Z882 Allergy status to sulfonamides status: Secondary | ICD-10-CM

## 2022-07-01 DIAGNOSIS — Z9181 History of falling: Secondary | ICD-10-CM

## 2022-07-01 DIAGNOSIS — R630 Anorexia: Secondary | ICD-10-CM | POA: Diagnosis present

## 2022-07-01 DIAGNOSIS — Z66 Do not resuscitate: Secondary | ICD-10-CM | POA: Diagnosis present

## 2022-07-01 DIAGNOSIS — Z888 Allergy status to other drugs, medicaments and biological substances status: Secondary | ICD-10-CM | POA: Diagnosis not present

## 2022-07-01 DIAGNOSIS — Z6820 Body mass index (BMI) 20.0-20.9, adult: Secondary | ICD-10-CM | POA: Diagnosis not present

## 2022-07-01 DIAGNOSIS — I959 Hypotension, unspecified: Secondary | ICD-10-CM | POA: Diagnosis present

## 2022-07-01 DIAGNOSIS — Z79899 Other long term (current) drug therapy: Secondary | ICD-10-CM

## 2022-07-01 DIAGNOSIS — S72001D Fracture of unspecified part of neck of right femur, subsequent encounter for closed fracture with routine healing: Secondary | ICD-10-CM | POA: Diagnosis not present

## 2022-07-01 DIAGNOSIS — Y929 Unspecified place or not applicable: Secondary | ICD-10-CM | POA: Diagnosis not present

## 2022-07-01 HISTORY — DX: Unspecified dementia, unspecified severity, without behavioral disturbance, psychotic disturbance, mood disturbance, and anxiety: F03.90

## 2022-07-01 LAB — CBC WITH DIFFERENTIAL/PLATELET
Abs Immature Granulocytes: 0.02 10*3/uL (ref 0.00–0.07)
Basophils Absolute: 0 10*3/uL (ref 0.0–0.1)
Basophils Relative: 0 %
Eosinophils Absolute: 0 10*3/uL (ref 0.0–0.5)
Eosinophils Relative: 0 %
HCT: 36.6 % (ref 36.0–46.0)
Hemoglobin: 11.2 g/dL — ABNORMAL LOW (ref 12.0–15.0)
Immature Granulocytes: 0 %
Lymphocytes Relative: 10 %
Lymphs Abs: 0.9 10*3/uL (ref 0.7–4.0)
MCH: 28.8 pg (ref 26.0–34.0)
MCHC: 30.6 g/dL (ref 30.0–36.0)
MCV: 94.1 fL (ref 80.0–100.0)
Monocytes Absolute: 0.8 10*3/uL (ref 0.1–1.0)
Monocytes Relative: 9 %
Neutro Abs: 7.3 10*3/uL (ref 1.7–7.7)
Neutrophils Relative %: 81 %
Platelets: 144 10*3/uL — ABNORMAL LOW (ref 150–400)
RBC: 3.89 MIL/uL (ref 3.87–5.11)
RDW: 13.2 % (ref 11.5–15.5)
WBC: 9.1 10*3/uL (ref 4.0–10.5)
nRBC: 0 % (ref 0.0–0.2)

## 2022-07-01 LAB — URINALYSIS, ROUTINE W REFLEX MICROSCOPIC
Bilirubin Urine: NEGATIVE
Glucose, UA: NEGATIVE mg/dL
Ketones, ur: 20 mg/dL — AB
Nitrite: POSITIVE — AB
Protein, ur: >=300 mg/dL — AB
Specific Gravity, Urine: 1.015 (ref 1.005–1.030)
WBC, UA: >50 WBC/hpf — ABNORMAL HIGH (ref 0–5)
pH: 6 (ref 5.0–8.0)

## 2022-07-01 LAB — BASIC METABOLIC PANEL
Anion gap: 8 (ref 5–15)
BUN: 26 mg/dL — ABNORMAL HIGH (ref 8–23)
CO2: 25 mmol/L (ref 22–32)
Calcium: 9.3 mg/dL (ref 8.9–10.3)
Chloride: 109 mmol/L (ref 98–111)
Creatinine, Ser: 1.16 mg/dL — ABNORMAL HIGH (ref 0.44–1.00)
GFR, Estimated: 51 mL/min — ABNORMAL LOW (ref >60)
Glucose, Bld: 172 mg/dL — ABNORMAL HIGH (ref 70–99)
Potassium: 4 mmol/L (ref 3.5–5.1)
Sodium: 142 mmol/L (ref 135–145)

## 2022-07-01 MED ORDER — ONDANSETRON HCL 4 MG/2ML IJ SOLN: 4.0000 mg | Freq: Once | INTRAMUSCULAR | Status: DC | PRN

## 2022-07-01 MED ORDER — FENTANYL CITRATE PF 50 MCG/ML IJ SOSY
25.0000 ug | PREFILLED_SYRINGE | Freq: Once | INTRAMUSCULAR | Status: AC
  Administered 2022-07-01: 25 ug via INTRAVENOUS
  Filled 2022-07-01: qty 1

## 2022-07-01 NOTE — Progress Notes (Signed)
Called regarding right hip fx.  Patient at [facility].  Plan to transfer to [facility], or [facility] if [facility] unavailable.  CT to determine degree of displacement, will need pinning vs. Hemi.  Possible surgery Sunday or Monday depending on transfer and optimization. Full consult to follow.    Johnny Bridge, MD

## 2022-07-01 NOTE — ED Provider Notes (Signed)
[facility] EMERGENCY DEPARTMENT Provider Note   CSN: [id number] Arrival date & time: 07/01/22  1408     History  Chief Complaint  Patient presents with   [name]    [name] is a 71 y.o. female.  Patient arrived via EMS for evaluation after a witnessed fall.  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  Patient reportedly disoriented at baseline with history of frequent falls.  Patient was walking when she fell.  Patient reportedly fell hitting her right hip and was laying guarding the right hip upon EMS arrival.  Upon arrival to the emergency department patient unable to say if anything hurts or not.  Unclear if patient hit her head or not.  Patient with no reported loss of consciousness.  Patient does not take blood thinners.  Patient did eat lunch today prior to the fall.  Past medical history otherwise significant for type 2 diabetes, GERD, memory issues.   HPI     Home Medications Prior to Admission medications   Medication Sig Start Date End Date Taking? Authorizing Provider  atorvastatin (LIPITOR) 20 MG tablet Take 40 mg by mouth at bedtime.    Verlee Monte, MD  cefdinir (OMNICEF) 300 MG capsule Take 1 capsule (300 mg total) by mouth every 12 (twelve) hours. 01/28/21   Orson Eva, MD  cholecalciferol (VITAMIN D3) 25 MCG (1000 UNIT) tablet Take 2,000 Units by mouth daily.    [provider]  citalopram (CELEXA) 20 MG tablet Take 10 mg by mouth daily.    [provider]  divalproex (DEPAKOTE) 125 MG DR tablet Take 125-250 mg by mouth 3 (three) times daily. 1 tablet at 0800 and 1400, 2 tablets at bedtime (1900) 01/13/21   [provider]  docusate sodium (COLACE) 100 MG capsule Take 100 mg by mouth 2 (two) times daily.    [provider]  famotidine (PEPCID) 10 MG tablet Take 10 mg by mouth 2 (two) times daily.    [provider]  fluticasone (FLONASE) 50 MCG/ACT nasal spray Place 2 sprays into both nostrils daily. 01/26/21    [provider]  Melatonin 5 MG CAPS Take 1 capsule by mouth at bedtime.    [provider]  mirtazapine (REMERON) 30 MG tablet Take 30 mg by mouth at bedtime.    [provider]  montelukast (SINGULAIR) 10 MG tablet Take 10 mg by mouth at bedtime.    [provider]  vitamin B-12 (CYANOCOBALAMIN) 500 MCG tablet Take 500 mcg by mouth daily.    [provider]      Allergies    Flomax [tamsulosin hcl] and Sulfa antibiotics    Review of Systems   Review of Systems  Reason unable to perform ROS: Patient with underlying memory issues/poor historian.  Musculoskeletal:  Positive for arthralgias (Patient appears to be guarding right hip).    Physical Exam Updated Vital Signs BP 112/64 (BP Location: Left Arm)   Pulse 98   Temp 98.6 F (37 C) (Oral)   Resp (!) 24   Ht '5\' 2"'$  (1.575 m)   SpO2 99%   BMI 24.19 kg/m  Physical Exam Vitals and nursing note reviewed.  Constitutional:      General: She is not in acute distress.    Appearance: She is well-developed.  HENT:     Head: Normocephalic and atraumatic.  Eyes:     Conjunctiva/sclera: Conjunctivae normal.  Cardiovascular:     Rate and Rhythm: Normal rate and regular rhythm.  Heart sounds: No murmur heard. Pulmonary:     Effort: Pulmonary effort is normal. No respiratory distress.     Breath sounds: Normal breath sounds.  Abdominal:     Palpations: Abdomen is soft.     Tenderness: There is no abdominal tenderness.  Musculoskeletal:        General: Tenderness (Tenderness to palpation of the lateral right hip, patient guards and withdraws when attempts are made to roll patient onto her back or to move right leg) and signs of injury present.     Cervical back: Neck supple.  Skin:    General: Skin is warm and dry.     Capillary Refill: Capillary refill takes less than 2 seconds.  Neurological:     Mental Status: She is alert. Mental status is at baseline.     ED Results /  Procedures / Treatments   Labs (all labs ordered are listed, but only abnormal results are displayed) Labs Reviewed  BASIC METABOLIC PANEL - Abnormal; Notable for the following components:      Result Value   Glucose, Bld 172 (*)    BUN 26 (*)    Creatinine, Ser 1.16 (*)    GFR, Estimated 51 (*)    All other components within normal limits  CBC WITH DIFFERENTIAL/PLATELET - Abnormal; Notable for the following components:   Hemoglobin 11.2 (*)    Platelets 144 (*)    All other components within normal limits  URINALYSIS, ROUTINE W REFLEX MICROSCOPIC    EKG None  Radiology DG Hip Unilat W or Wo Pelvis 2-3 Views Right  Result Date: 07/01/2022 CLINICAL DATA:  Right-sided hip and thigh pain after fall. EXAM: DG HIP (WITH OR WITHOUT PELVIS) 2-3V RIGHT COMPARISON:  None. FINDINGS: Although limited by positioning, there is an impacted right femoral neck fracture. Mild rotation of the femur. The femoral head is seated. The bones are diffusely under mineralized. Mild bilateral hip degenerative change. Intact pubic rami. IMPRESSION: Impacted right femoral neck fracture. Electronically Signed   By: Keith Rake M.D.   On: 07/01/2022 15:45   CT Cervical Spine Wo Contrast  Result Date: 07/01/2022 CLINICAL DATA:  Neck trauma (Age >= 65y) EXAM: CT CERVICAL SPINE WITHOUT CONTRAST TECHNIQUE: Multidetector CT imaging of the cervical spine was performed without intravenous contrast. Multiplanar CT image reconstructions were also generated. RADIATION DOSE REDUCTION: This exam was performed according to the departmental dose-optimization program which includes automated exposure control, adjustment of the mA and/or kV according to patient size and/or use of iterative reconstruction technique. COMPARISON:  Cervical spine CT 05/14/2022 FINDINGS: Alignment: No traumatic subluxation. Similar reversal of normal lordosis. Skull base and vertebrae: No acute fracture. Vertebral body heights are maintained. The dens  and skull base are intact. Soft tissues and spinal canal: No prevertebral fluid or swelling. No visible canal hematoma. Disc levels: Degenerative disc disease from C3-C4 through C6-C7, stable. Upper chest: No acute or unexpected findings. Other: None. IMPRESSION: Stable degenerative change in the cervical spine without acute fracture or subluxation. Electronically Signed   By: Keith Rake M.D.   On: 07/01/2022 15:41   CT Head Wo Contrast  Result Date: 07/01/2022 CLINICAL DATA:  Post fall. EXAM: CT HEAD WITHOUT CONTRAST TECHNIQUE: Contiguous axial images were obtained from the base of the skull through the vertex without intravenous contrast. RADIATION DOSE REDUCTION: This exam was performed according to the departmental dose-optimization program which includes automated exposure control, adjustment of the mA and/or kV according to patient size and/or use of iterative  reconstruction technique. COMPARISON:  05/14/2022 FINDINGS: Brain: No acute intracranial hemorrhage. No subdural or extra-axial collection. Stable degree of atrophy and chronic small vessel ischemia. No evidence of acute infarct. No midline shift, mass effect or hydrocephalus. Vascular: Atherosclerosis of skullbase vasculature without hyperdense vessel or abnormal calcification. Skull: No fracture or focal lesion. Sinuses/Orbits: Paranasal sinuses and mastoid air cells are clear. The visualized orbits are unremarkable. Other: No confluent scalp hematoma. IMPRESSION: 1. No acute intracranial abnormality. No skull fracture. 2. Stable atrophy and chronic small vessel ischemia. Electronically Signed   By: Keith Rake M.D.   On: 07/01/2022 15:38    Procedures Procedures    Medications Ordered in ED Medications  ondansetron (ZOFRAN) injection 4 mg (has no administration in time range)  fentaNYL (SUBLIMAZE) injection 25 mcg (25 mcg Intravenous Given 07/01/22 1634)    ED Course/ Medical Decision Making/ A&P                            Medical Decision Making Amount and/or Complexity of Data Reviewed Labs: ordered. Radiology: ordered.  Risk Prescription drug management. Decision regarding hospitalization.   This patient presents to the ED for concern of possible right hip pain with possible injury to head, this involves an extensive number of treatment options, and is a complaint that carries with it a high risk of complications and morbidity.  The differential diagnosis includes fracture, dislocation, soft tissue injury, intracranial abnormality, cervical spine injury and others   Co morbidities that complicate the patient evaluation  Patient with memory care issues, unable to contribute to history   Additional history obtained:  Additional history obtained from EMS External records from outside source obtained and reviewed including emergency department notes from September and April of this year for previous falls from the nursing home   Lab Tests:  I Ordered, and personally interpreted labs.  The pertinent results include: Grossly unremarkable BMP, CBC.  UA pending   Imaging Studies ordered:  I ordered imaging studies including CT head, CT cervical spine, and plain films of the right hip  I independently visualized and interpreted imaging which showed  Stable degenerative change in the cervical spine without acute  fracture or subluxation.   1. No acute intracranial abnormality. No skull fracture.  2. Stable atrophy and chronic small vessel ischemia.  Impacted right femoral neck fracture.  I agree with the radiologist interpretation   Cardiac Monitoring: / EKG:  The patient was maintained on a cardiac monitor.  I personally viewed and interpreted the cardiac monitored which showed an underlying rhythm of: sinus tachycardia   Consultations Obtained:  I requested consultation with the orthopedic surgeon,Dr.Landau  and discussed lab and imaging findings as well as pertinent plan - they recommend:  Admission to [facility] if possible, if no bed [facility] is acceptable, admission to medicine service, NPO after midnight  I discussed case with Dr. Nehemiah Settle, hospitalist, who agreed to admit the patient   Problem List / ED Course / Critical interventions / Medication management  Right impacted femoral neck fracture I ordered medication including fentanyl  for pain, zofran PRN for nausea  Reevaluation of the patient after these medicines showed that the patient improved I have reviewed the patients home medicines and have made adjustments as needed   Test / Admission - Considered:  Patient will need admission for surgical intervention for a right femoral neck fracture.          Final Clinical Impression(s) /  ED Diagnoses Final diagnoses:  Closed fracture of neck of right femur, initial encounter [facility])  Fall, initial encounter    Rx / DC Orders ED Discharge Orders     None         Ronny Bacon 07/01/22 1724    Isla Pence, MD 07/02/22 [phone]

## 2022-07-01 NOTE — ED Triage Notes (Signed)
Patient arrived via EMS from [facility] for evaluation from a fall and right hip pain. Patient is A/O x0. Patient is a [employer] care resident at [facility].

## 2022-07-01 NOTE — ED Notes (Signed)
Called pt's POA Starling Manns to inform of pt status and plan for admission. POA requests a call at [phone] with any concerns or updates regarding plans for admission/surgery.

## 2022-07-01 NOTE — H&P (Signed)
History and Physical    Patient: [name] IWL:[id number] DOB: 09-Oct-1950 DOA: 07/01/2022 DOS: the patient was seen and examined on 07/01/2022 PCP: Caryl Bis, MD  Patient coming from: SNF  Chief Complaint:  Chief Complaint  Patient presents with   Fall   HPI: [name] is a 71 y.o. female with medical history significant of severe dementia, type 2 diabetes, hyperlipidemia, GERD, depression.  Patient is a resident at a memory facility.  She is unable to provide history.  She had a fall at the memory facility and hit her right hip.  She was immediately unable to move because of pain.  She was brought here for evaluation.  Review of Systems: Unable to review all systems due to lack of cooperation from patient. Past Medical History:  Diagnosis Date   Arthritis    fingers   Depression    GERD (gastroesophageal reflux disease)    Headache(784.0)    History of hypertension    NO MEDS NOW   History of kidney stones    Hydronephrosis, right    Hyperlipidemia    Renal calculi    LEFT   Type 2 diabetes mellitus ([place])    Urge urinary incontinence    Wears glasses    Past Surgical History:  Procedure Laterality Date   ANTERIOR AND POSTERIOR REPAIR  07/13/2011   Procedure: ANTERIOR (CYSTOCELE) AND POSTERIOR REPAIR (RECTOCELE);  Surgeon: Arloa Koh;  Location: [facility];  Service: Gynecology;  Laterality: N/A;   BILIARY STENT PLACEMENT  08/22/2011   Procedure: BILIARY STENT PLACEMENT;  Surgeon: Missy Sabins, MD;  Location: [place];  Service: Endoscopy;  Laterality: N/A;  35fx 5cm stent for bile leak. placed in CBD   BLADDER SUSPENSION  07/13/2011   Procedure: TRANSVAGINAL TAPE (TVT) PROCEDURE;  Surgeon: BArloa Koh  [facility];  Service: Gynecology;  Laterality: N/A;   CYSTO/ RETROGRADE PYELOGRAM/ RIGHT URETERAL DILATION  10-28-2008   CYSTO/ RIGHT URETERAL BALLOON DILATION/ RIGHT URETEROSCOPIC STONE EXTRACTIO/ STENT  11-29-2010   CYSTO/ URETEROSCOPIC LASER  LITHO OF STONES  2009  &  09-30-2010   CYSTOSCOPY  07/13/2011   Procedure: CYSTOSCOPY;  Surgeon: BArloa Koh  [facility];  Service: Gynecology;  Laterality: N/A;   CYSTOSCOPY/RETROGRADE/URETEROSCOPY/STONE EXTRACTION WITH BASKET Bilateral 05/05/2013   Procedure: CYSTOSCOPY/RETROGRADE/URETEROSCOPY/STONE EXTRACTION WITH BASKET   Procedure:Cysto, Right Retrograde Pyelogram, Left Ureteroscopy with Holmium Laser, Extraction of Stones;  Surgeon: SFranchot Gallo MD;  [facility]  Service: Urology;  Laterality: Bilateral;   ERCP  08/22/2011   Procedure: ENDOSCOPIC RETROGRADE CHOLANGIOPANCREATOGRAPHY (ERCP);  Surgeon: JMissy Sabins MD;  [facility];  Service: Endoscopy;  Laterality: N/A;   ERCP  12/15/2011   Procedure: ENDOSCOPIC RETROGRADE CHOLANGIOPANCREATOGRAPHY (ERCP);  Surgeon: JMissy Sabins MD;  [facility];  Service: Endoscopy;  Laterality: N/A;  stent removal/mod. sedation   EXTRACORPOREAL SHOCK WAVE LITHOTRIPSY Left 04-17-2011   HOLMIUM LASER APPLICATION Left 91/02/4080  Procedure: HOLMIUM LASER APPLICATION;  Surgeon: SFranchot Gallo MD;  [facility]  Service: Urology;  Laterality: Left;   LAPAROSCOPIC ASSISTED VAGINAL HYSTERECTOMY  07/13/2011   Procedure: LAPAROSCOPIC ASSISTED VAGINAL HYSTERECTOMY;  Surgeon: BArloa Koh  [facility];  Service: Gynecology;  Laterality: N/A;   LAPAROSCOPIC CHOLECYSTECTOMY  08-17-2011   SALPINGOOPHORECTOMY  07/13/2011   Procedure: SALPINGO OOPHERECTOMY;  Surgeon: BArloa Koh  [facility];  Service: Gynecology;  Laterality: Bilateral;   Social History:  reports that  she has never smoked. She has never used smokeless tobacco. She reports that she does not drink alcohol and does not use drugs.  Allergies  Allergen Reactions   Flomax [Tamsulosin Hcl] Swelling    JAWS   Sulfa Antibiotics Swelling    Family History  Problem Relation Age of Onset   Dementia Father     Anesthesia problems Neg Hx    Hypotension Neg Hx    Malignant hyperthermia Neg Hx    Pseudochol deficiency Neg Hx     Prior to Admission medications   Medication Sig Start Date End Date Taking? Authorizing Provider  atorvastatin (LIPITOR) 20 MG tablet Take 40 mg by mouth at bedtime.    Verlee Monte, MD  cefdinir (OMNICEF) 300 MG capsule Take 1 capsule (300 mg total) by mouth every 12 (twelve) hours. 01/28/21   Orson Eva, MD  cholecalciferol (VITAMIN D3) 25 MCG (1000 UNIT) tablet Take 2,000 Units by mouth daily.    [provider]  citalopram (CELEXA) 20 MG tablet Take 10 mg by mouth daily.    [provider]  divalproex (DEPAKOTE) 125 MG DR tablet Take 125-250 mg by mouth 3 (three) times daily. 1 tablet at 0800 and 1400, 2 tablets at bedtime (1900) 01/13/21   [provider]  docusate sodium (COLACE) 100 MG capsule Take 100 mg by mouth 2 (two) times daily.    [provider]  famotidine (PEPCID) 10 MG tablet Take 10 mg by mouth 2 (two) times daily.    [provider]  fluticasone (FLONASE) 50 MCG/ACT nasal spray Place 2 sprays into both nostrils daily. 01/26/21   [provider]  Melatonin 5 MG CAPS Take 1 capsule by mouth at bedtime.    [provider]  mirtazapine (REMERON) 30 MG tablet Take 30 mg by mouth at bedtime.    [provider]  montelukast (SINGULAIR) 10 MG tablet Take 10 mg by mouth at bedtime.    [provider]  vitamin B-12 (CYANOCOBALAMIN) 500 MCG tablet Take 500 mcg by mouth daily.    [provider]    Physical Exam: Vitals:   07/01/22 1431 07/01/22 1439 07/01/22 1545 07/01/22 1548  BP: (!) 77/52  112/64 112/64  Pulse: 98   98  Resp: 18  16 (!) 24  Temp: 98.6 F (37 C)     TempSrc: Oral     SpO2: 99%   99%  Height:  '5\' 2"'$  (1.575 m)     General: Elderly female. Awake and alert and oriented x0. No acute cardiopulmonary distress.  HEENT: Normocephalic atraumatic.  Right and  left ears normal in appearance.  Pupils equal, round, reactive to light. Extraocular muscles are intact. Sclerae anicteric and noninjected.  Moist mucosal membranes. No mucosal lesions.  Neck: Neck supple without lymphadenopathy. No carotid bruits. No masses palpated.  Cardiovascular: Regular rate with normal S1-S2 sounds. No murmurs, rubs, gallops auscultated. No JVD.  Respiratory: Good respiratory effort with no wheezes, rales, rhonchi. Lungs clear to auscultation bilaterally.  No accessory muscle use. Abdomen: Soft, nontender, nondistended. Active bowel sounds. No masses or hepatosplenomegaly  Skin: No rashes, lesions, or ulcerations.  Dry, warm to touch. 2+ dorsalis pedis and radial pulses. Musculoskeletal: No calf or leg pain. All major joints not erythematous nontender.  No upper or lower joint deformation.  Good ROM.  No contractures  Psychiatric: No insight or judgment.  She is pleasant Neurologic: No focal neurological deficits.  Patient not moving lower extremities  Data Reviewed: Results  for orders placed or performed during the hospital encounter of 07/01/22 (from the past 24 hour(s))  Basic metabolic panel     Status: Abnormal   Collection Time: 07/01/22  3:45 PM  Result Value Ref Range   Sodium 142 135 - 145 mmol/L   Potassium 4.0 3.5 - 5.1 mmol/L   Chloride 109 98 - 111 mmol/L   CO2 25 22 - 32 mmol/L   Glucose, Bld 172 (H) 70 - 99 mg/dL   BUN 26 (H) 8 - 23 mg/dL   Creatinine, Ser 1.16 (H) 0.44 - 1.00 mg/dL   Calcium 9.3 8.9 - 10.3 mg/dL   GFR, Estimated 51 (L) >60 mL/min   Anion gap 8 5 - 15  CBC with Differential     Status: Abnormal   Collection Time: 07/01/22  3:45 PM  Result Value Ref Range   WBC 9.1 4.0 - 10.5 K/uL   RBC 3.89 3.87 - 5.11 MIL/uL   Hemoglobin 11.2 (L) 12.0 - 15.0 g/dL   HCT 36.6 36.0 - 46.0 %   MCV 94.1 80.0 - 100.0 fL   MCH 28.8 26.0 - 34.0 pg   MCHC 30.6 30.0 - 36.0 g/dL   RDW 13.2 11.5 - 15.5 %   Platelets 144 (L) 150 - 400 K/uL   nRBC 0.0  0.0 - 0.2 %   Neutrophils Relative % 81 %   Neutro Abs 7.3 1.7 - 7.7 K/uL   Lymphocytes Relative 10 %   Lymphs Abs 0.9 0.7 - 4.0 K/uL   Monocytes Relative 9 %   Monocytes Absolute 0.8 0.1 - 1.0 K/uL   Eosinophils Relative 0 %   Eosinophils Absolute 0.0 0.0 - 0.5 K/uL   Basophils Relative 0 %   Basophils Absolute 0.0 0.0 - 0.1 K/uL   Immature Granulocytes 0 %   Abs Immature Granulocytes 0.02 0.00 - 0.07 K/uL   DG Hip Unilat W or Wo Pelvis 2-3 Views Right  Result Date: 07/01/2022 CLINICAL DATA:  Right-sided hip and thigh pain after fall. EXAM: DG HIP (WITH OR WITHOUT PELVIS) 2-3V RIGHT COMPARISON:  None. FINDINGS: Although limited by positioning, there is an impacted right femoral neck fracture. Mild rotation of the femur. The femoral head is seated. The bones are diffusely under mineralized. Mild bilateral hip degenerative change. Intact pubic rami. IMPRESSION: Impacted right femoral neck fracture. Electronically Signed   By: Keith Rake M.D.   On: 07/01/2022 15:45   CT Cervical Spine Wo Contrast  Result Date: 07/01/2022 CLINICAL DATA:  Neck trauma (Age >= 65y) EXAM: CT CERVICAL SPINE WITHOUT CONTRAST TECHNIQUE: Multidetector CT imaging of the cervical spine was performed without intravenous contrast. Multiplanar CT image reconstructions were also generated. RADIATION DOSE REDUCTION: This exam was performed according to the departmental dose-optimization program which includes automated exposure control, adjustment of the mA and/or kV according to patient size and/or use of iterative reconstruction technique. COMPARISON:  Cervical spine CT 05/14/2022 FINDINGS: Alignment: No traumatic subluxation. Similar reversal of normal lordosis. Skull base and vertebrae: No acute fracture. Vertebral body heights are maintained. The dens and skull base are intact. Soft tissues and spinal canal: No prevertebral fluid or swelling. No visible canal hematoma. Disc levels: Degenerative disc disease from C3-C4  through C6-C7, stable. Upper chest: No acute or unexpected findings. Other: None. IMPRESSION: Stable degenerative change in the cervical spine without acute fracture or subluxation. Electronically Signed   By: Keith Rake M.D.   On: 07/01/2022 15:41   CT Head Wo Contrast  Result  Date: 07/01/2022 CLINICAL DATA:  Post fall. EXAM: CT HEAD WITHOUT CONTRAST TECHNIQUE: Contiguous axial images were obtained from the base of the skull through the vertex without intravenous contrast. RADIATION DOSE REDUCTION: This exam was performed according to the departmental dose-optimization program which includes automated exposure control, adjustment of the mA and/or kV according to patient size and/or use of iterative reconstruction technique. COMPARISON:  05/14/2022 FINDINGS: Brain: No acute intracranial hemorrhage. No subdural or extra-axial collection. Stable degree of atrophy and chronic small vessel ischemia. No evidence of acute infarct. No midline shift, mass effect or hydrocephalus. Vascular: Atherosclerosis of skullbase vasculature without hyperdense vessel or abnormal calcification. Skull: No fracture or focal lesion. Sinuses/Orbits: Paranasal sinuses and mastoid air cells are clear. The visualized orbits are unremarkable. Other: No confluent scalp hematoma. IMPRESSION: 1. No acute intracranial abnormality. No skull fracture. 2. Stable atrophy and chronic small vessel ischemia. Electronically Signed   By: Keith Rake M.D.   On: 07/01/2022 15:38     Assessment and Plan: No notes have been filed under this hospital service. Service: Hospitalist  Principal Problem:   Closed right hip fracture ([place]) Active Problems:   Hyperlipidemia   GERD (gastroesophageal reflux disease)   Hypertension   Diabetes mellitus ([place])   Dementia without behavioral disturbance ([place])   Closed right hip fracture Admit to [facility] for planned surgery tomorrow Pain control N.p.o. after midnight IV  fluids Diabetes On a scale insulin Severe dementia Hyperlipidemia Continue statin Hypertension   Advance Care Planning:   Code Status: Prior full code per forms from memory facility  Consults: Orthopedics  Family Communication: None  Severity of Illness: The appropriate patient status for this patient is INPATIENT. Inpatient status is judged to be reasonable and necessary in order to provide the required intensity of service to ensure the patient's safety. The patient's presenting symptoms, physical exam findings, and initial radiographic and laboratory data in the context of their chronic comorbidities is felt to place them at high risk for further clinical deterioration. Furthermore, it is not anticipated that the patient will be medically stable for discharge from the hospital within 2 midnights of admission.   * I certify that at the point of admission it is my clinical judgment that the patient will require inpatient hospital care spanning beyond 2 midnights from the point of admission due to high intensity of service, high risk for further deterioration and high frequency of surveillance required.*  Author: Truett Mainland, DO 07/01/2022 5:27 PM  For on call review [email or web address].

## 2022-07-01 NOTE — ED Notes (Addendum)
Call to [facility] at [phone] and spoke with Tiffany (Med tech) who states pt last ate at noon. Pt baseline is oriented x 0 with very poor safety awareness but pt is ambulatory at baseline. She was walking at the facility earlier today when she fell resulting in present injury. RN informed Jonelle Sidle that pt will likely be admitted for fx repair.

## 2022-07-01 NOTE — ED Notes (Signed)
Pt transported to CT ?

## 2022-07-01 NOTE — ED Notes (Signed)
Upon entering room, noted that pt had pulled out IV, cath intact. Pt lying on left side, attempted to reposition on back, but pt immediately turns back over on left side.

## 2022-07-02 ENCOUNTER — Inpatient Hospital Stay (HOSPITAL_COMMUNITY): Payer: Medicare PPO

## 2022-07-02 ENCOUNTER — Other Ambulatory Visit: Payer: Self-pay

## 2022-07-02 ENCOUNTER — Encounter (HOSPITAL_COMMUNITY)
Admission: EM | Disposition: A | Discharge status: Skilled Nursing Facility | Payer: Self-pay | Source: Skilled Nursing Facility | Attending: Internal Medicine

## 2022-07-02 ENCOUNTER — Encounter (HOSPITAL_COMMUNITY): Payer: Self-pay | Admitting: Family Medicine

## 2022-07-02 ENCOUNTER — Inpatient Hospital Stay (HOSPITAL_COMMUNITY): Payer: Medicare PPO | Admitting: Anesthesiology

## 2022-07-02 DIAGNOSIS — E119 Type 2 diabetes mellitus without complications: Secondary | ICD-10-CM

## 2022-07-02 DIAGNOSIS — N289 Disorder of kidney and ureter, unspecified: Secondary | ICD-10-CM | POA: Diagnosis not present

## 2022-07-02 DIAGNOSIS — I10 Essential (primary) hypertension: Secondary | ICD-10-CM | POA: Diagnosis not present

## 2022-07-02 DIAGNOSIS — S72001A Fracture of unspecified part of neck of right femur, initial encounter for closed fracture: Secondary | ICD-10-CM | POA: Diagnosis not present

## 2022-07-02 DIAGNOSIS — S72141A Displaced intertrochanteric fracture of right femur, initial encounter for closed fracture: Secondary | ICD-10-CM

## 2022-07-02 HISTORY — PX: INTRAMEDULLARY (IM) NAIL INTERTROCHANTERIC: SHX5875

## 2022-07-02 LAB — GLUCOSE, CAPILLARY
Glucose-Capillary: 123 mg/dL — ABNORMAL HIGH (ref 70–99)
Glucose-Capillary: 101 mg/dL — ABNORMAL HIGH (ref 70–99)
Glucose-Capillary: 161 mg/dL — ABNORMAL HIGH (ref 70–99)
Glucose-Capillary: 149 mg/dL — ABNORMAL HIGH (ref 70–99)

## 2022-07-02 LAB — SURGICAL PCR SCREEN
MRSA, PCR: NEGATIVE
MRSA, PCR: NEGATIVE
Staphylococcus aureus: NEGATIVE
Staphylococcus aureus: NEGATIVE

## 2022-07-02 LAB — CBG MONITORING, ED: Glucose-Capillary: 145 mg/dL — ABNORMAL HIGH (ref 70–99)

## 2022-07-02 LAB — CBC
HCT: 34.9 % — ABNORMAL LOW (ref 36.0–46.0)
Hemoglobin: 10.7 g/dL — ABNORMAL LOW (ref 12.0–15.0)
MCH: 28.7 pg (ref 26.0–34.0)
MCHC: 30.7 g/dL (ref 30.0–36.0)
MCV: 93.6 fL (ref 80.0–100.0)
Platelets: 114 10*3/uL — ABNORMAL LOW (ref 150–400)
RBC: 3.73 MIL/uL — ABNORMAL LOW (ref 3.87–5.11)
RDW: 13.2 % (ref 11.5–15.5)
WBC: 8.4 10*3/uL (ref 4.0–10.5)
nRBC: 0 % (ref 0.0–0.2)

## 2022-07-02 LAB — CREATININE, SERUM
Creatinine, Ser: 0.88 mg/dL (ref 0.44–1.00)
GFR, Estimated: >60 mL/min (ref >60)

## 2022-07-02 LAB — HEMOGLOBIN A1C
Hgb A1c MFr Bld: 5.5 % (ref 4.8–5.6)
Mean Plasma Glucose: 111.15 mg/dL

## 2022-07-02 SURGERY — FIXATION, FRACTURE, INTERTROCHANTERIC, WITH INTRAMEDULLARY ROD
Anesthesia: General | Site: Hip | Laterality: Right

## 2022-07-02 MED ORDER — SODIUM CHLORIDE 0.9 % IV SOLN: INTRAVENOUS | Status: DC

## 2022-07-02 MED ORDER — LACTATED RINGERS IV SOLN: INTRAVENOUS | Status: DC | PRN

## 2022-07-02 MED ORDER — CHLORHEXIDINE GLUCONATE 0.12 % MT SOLN: 15.0000 mL | Freq: Once | OROMUCOSAL | Status: AC

## 2022-07-02 MED ORDER — ONDANSETRON HCL 4 MG/2ML IJ SOLN: 4.0000 mg | Freq: Four times a day (QID) | INTRAMUSCULAR | Status: DC | PRN

## 2022-07-02 MED ORDER — POLYETHYLENE GLYCOL 3350 17 G PO PACK
17.0000 g | PACK | Freq: Two times a day (BID) | ORAL | Status: AC
  Administered 2022-07-02 – 2022-07-03 (×3): 17 g via ORAL
  Filled 2022-07-02 (×2): qty 1

## 2022-07-02 MED ORDER — POLYETHYLENE GLYCOL 3350 17 G PO PACK: 17.0000 g | PACK | Freq: Every day | ORAL | Status: DC | PRN

## 2022-07-02 MED ORDER — DIVALPROEX SODIUM 250 MG PO DR TAB: 250.0000 mg | DELAYED_RELEASE_TABLET | ORAL | Status: DC

## 2022-07-02 MED ORDER — METOCLOPRAMIDE HCL 5 MG/ML IJ SOLN: 5.0000 mg | Freq: Three times a day (TID) | INTRAMUSCULAR | Status: DC | PRN

## 2022-07-02 MED ORDER — ROCURONIUM BROMIDE 10 MG/ML (PF) SYRINGE
PREFILLED_SYRINGE | INTRAVENOUS | Status: AC
  Filled 2022-07-02: qty 10

## 2022-07-02 MED ORDER — ACETAMINOPHEN 325 MG PO TABS
325.0000 mg | ORAL_TABLET | Freq: Four times a day (QID) | ORAL | Status: DC | PRN
  Filled 2022-07-02: qty 2

## 2022-07-02 MED ORDER — DEXAMETHASONE SODIUM PHOSPHATE 10 MG/ML IJ SOLN
INTRAMUSCULAR | Status: AC
  Filled 2022-07-02: qty 1

## 2022-07-02 MED ORDER — TRANEXAMIC ACID-NACL 1000-0.7 MG/100ML-% IV SOLN
INTRAVENOUS | Status: AC
  Filled 2022-07-02: qty 100

## 2022-07-02 MED ORDER — CHLORHEXIDINE GLUCONATE 0.12 % MT SOLN
OROMUCOSAL | Status: AC
  Filled 2022-07-02: qty 15

## 2022-07-02 MED ORDER — ENOXAPARIN SODIUM 40 MG/0.4ML IJ SOSY
40.0000 mg | PREFILLED_SYRINGE | INTRAMUSCULAR | Status: DC
  Administered 2022-07-03 – 2022-07-15 (×13): 40 mg via SUBCUTANEOUS
  Filled 2022-07-02 (×14): qty 0.4

## 2022-07-02 MED ORDER — VALPROATE SODIUM 100 MG/ML IV SOLN
125.0000 mg | Freq: Once | INTRAVENOUS | Status: AC
  Administered 2022-07-02: 125 mg via INTRAVENOUS
  Filled 2022-07-02: qty 1.25

## 2022-07-02 MED ORDER — HYDROCODONE-ACETAMINOPHEN 5-325 MG PO TABS: 1.0000 | ORAL_TABLET | ORAL | Status: DC | PRN

## 2022-07-02 MED ORDER — ATORVASTATIN CALCIUM 40 MG PO TABS
40.0000 mg | ORAL_TABLET | Freq: Every day | ORAL | Status: DC
  Administered 2022-07-02 – 2022-07-14 (×12): 40 mg via ORAL
  Filled 2022-07-02 (×13): qty 1

## 2022-07-02 MED ORDER — CHLORHEXIDINE GLUCONATE 0.12 % MT SOLN
OROMUCOSAL | Status: AC
  Administered 2022-07-02: 15 mL via OROMUCOSAL
  Filled 2022-07-02: qty 15

## 2022-07-02 MED ORDER — ONDANSETRON HCL 4 MG PO TABS: 4.0000 mg | ORAL_TABLET | Freq: Four times a day (QID) | ORAL | Status: DC | PRN

## 2022-07-02 MED ORDER — METOCLOPRAMIDE HCL 5 MG PO TABS: 5.0000 mg | ORAL_TABLET | Freq: Three times a day (TID) | ORAL | Status: DC | PRN

## 2022-07-02 MED ORDER — CEFAZOLIN SODIUM-DEXTROSE 2-4 GM/100ML-% IV SOLN
2.0000 g | Freq: Three times a day (TID) | INTRAVENOUS | Status: AC
  Administered 2022-07-02 – 2022-07-03 (×3): 2 g via INTRAVENOUS
  Filled 2022-07-02 (×3): qty 100

## 2022-07-02 MED ORDER — FENTANYL CITRATE (PF) 250 MCG/5ML IJ SOLN
INTRAMUSCULAR | Status: AC
  Filled 2022-07-02: qty 5

## 2022-07-02 MED ORDER — INSULIN ASPART 100 UNIT/ML IJ SOLN: 0.0000 [IU] | INTRAMUSCULAR | Status: DC | PRN

## 2022-07-02 MED ORDER — TRANEXAMIC ACID-NACL 1000-0.7 MG/100ML-% IV SOLN
1000.0000 mg | INTRAVENOUS | Status: AC
  Administered 2022-07-02: 1000 mg via INTRAVENOUS
  Filled 2022-07-02: qty 100

## 2022-07-02 MED ORDER — METHOCARBAMOL 500 MG PO TABS
500.0000 mg | ORAL_TABLET | Freq: Four times a day (QID) | ORAL | Status: DC | PRN
  Administered 2022-07-04 – 2022-07-14 (×3): 500 mg via ORAL
  Filled 2022-07-02 (×3): qty 1

## 2022-07-02 MED ORDER — ONDANSETRON HCL 4 MG/2ML IJ SOLN
INTRAMUSCULAR | Status: AC
  Filled 2022-07-02: qty 2

## 2022-07-02 MED ORDER — CEFAZOLIN SODIUM-DEXTROSE 2-4 GM/100ML-% IV SOLN
INTRAVENOUS | Status: AC
  Filled 2022-07-02: qty 100

## 2022-07-02 MED ORDER — MORPHINE SULFATE (PF) 2 MG/ML IV SOLN
0.5000 mg | INTRAVENOUS | Status: DC | PRN
  Administered 2022-07-02 – 2022-07-03 (×2): 1 mg via INTRAVENOUS
  Filled 2022-07-02 (×4): qty 1

## 2022-07-02 MED ORDER — PROPOFOL 10 MG/ML IV BOLUS
INTRAVENOUS | Status: DC | PRN
  Administered 2022-07-02: 110 mg via INTRAVENOUS

## 2022-07-02 MED ORDER — 0.9 % SODIUM CHLORIDE (POUR BTL) OPTIME
TOPICAL | Status: DC | PRN
  Administered 2022-07-02: 1000 mL

## 2022-07-02 MED ORDER — PROPOFOL 10 MG/ML IV BOLUS
INTRAVENOUS | Status: AC
  Filled 2022-07-02: qty 20

## 2022-07-02 MED ORDER — MIRTAZAPINE 15 MG PO TABS
30.0000 mg | ORAL_TABLET | Freq: Every day | ORAL | Status: DC
  Administered 2022-07-02 – 2022-07-14 (×13): 30 mg via ORAL
  Filled 2022-07-02 (×13): qty 2

## 2022-07-02 MED ORDER — DEXAMETHASONE SODIUM PHOSPHATE 10 MG/ML IJ SOLN
INTRAMUSCULAR | Status: DC | PRN
  Administered 2022-07-02: 10 mg via INTRAVENOUS

## 2022-07-02 MED ORDER — PHENYLEPHRINE 80 MCG/ML (10ML) SYRINGE FOR IV PUSH (FOR BLOOD PRESSURE SUPPORT)
PREFILLED_SYRINGE | INTRAVENOUS | Status: AC
  Filled 2022-07-02: qty 30

## 2022-07-02 MED ORDER — DOCUSATE SODIUM 100 MG PO CAPS
100.0000 mg | ORAL_CAPSULE | Freq: Two times a day (BID) | ORAL | Status: DC
  Administered 2022-07-02 – 2022-07-15 (×18): 100 mg via ORAL
  Filled 2022-07-02 (×22): qty 1

## 2022-07-02 MED ORDER — HYDROCODONE-ACETAMINOPHEN 7.5-325 MG PO TABS: 1.0000 | ORAL_TABLET | ORAL | Status: DC | PRN

## 2022-07-02 MED ORDER — DIVALPROEX SODIUM 125 MG PO DR TAB
125.0000 mg | DELAYED_RELEASE_TABLET | Freq: Two times a day (BID) | ORAL | Status: DC
  Filled 2022-07-02 (×5): qty 1

## 2022-07-02 MED ORDER — FENTANYL CITRATE (PF) 250 MCG/5ML IJ SOLN
INTRAMUSCULAR | Status: DC | PRN
  Administered 2022-07-02: 50 ug via INTRAVENOUS
  Administered 2022-07-02: 100 ug via INTRAVENOUS

## 2022-07-02 MED ORDER — DIVALPROEX SODIUM 125 MG PO CSDR: 125.0000 mg | DELAYED_RELEASE_CAPSULE | Freq: Two times a day (BID) | ORAL | Status: DC

## 2022-07-02 MED ORDER — SUGAMMADEX SODIUM 200 MG/2ML IV SOLN
INTRAVENOUS | Status: DC | PRN
  Administered 2022-07-02: 50 mg via INTRAVENOUS
  Administered 2022-07-02: 150 mg via INTRAVENOUS

## 2022-07-02 MED ORDER — ROCURONIUM BROMIDE 10 MG/ML (PF) SYRINGE
PREFILLED_SYRINGE | INTRAVENOUS | Status: DC | PRN
  Administered 2022-07-02: 50 mg via INTRAVENOUS

## 2022-07-02 MED ORDER — METHOCARBAMOL 1000 MG/10ML IJ SOLN: 500.0000 mg | Freq: Four times a day (QID) | INTRAVENOUS | Status: DC | PRN

## 2022-07-02 MED ORDER — DIVALPROEX SODIUM 125 MG PO CSDR
125.0000 mg | DELAYED_RELEASE_CAPSULE | Freq: Two times a day (BID) | ORAL | Status: DC
  Filled 2022-07-02 (×3): qty 1

## 2022-07-02 MED ORDER — FENTANYL CITRATE (PF) 100 MCG/2ML IJ SOLN: 25.0000 ug | INTRAMUSCULAR | Status: DC | PRN

## 2022-07-02 MED ORDER — LIDOCAINE 2% (20 MG/ML) 5 ML SYRINGE
INTRAMUSCULAR | Status: DC | PRN
  Administered 2022-07-02: 60 mg via INTRAVENOUS

## 2022-07-02 MED ORDER — ACETAMINOPHEN 500 MG PO TABS: 1000.0000 mg | ORAL_TABLET | Freq: Once | ORAL | Status: DC

## 2022-07-02 MED ORDER — ORAL CARE MOUTH RINSE: 15.0000 mL | Freq: Once | OROMUCOSAL | Status: AC

## 2022-07-02 MED ORDER — POVIDONE-IODINE 10 % EX SWAB
2.0000 | Freq: Once | CUTANEOUS | Status: AC
  Administered 2022-07-02: 2 via TOPICAL

## 2022-07-02 MED ORDER — HYDROMORPHONE HCL 1 MG/ML IJ SOLN: 0.2500 mg | INTRAMUSCULAR | Status: DC | PRN

## 2022-07-02 MED ORDER — LACTATED RINGERS IV SOLN: INTRAVENOUS | Status: DC

## 2022-07-02 MED ORDER — FAMOTIDINE 20 MG PO TABS
10.0000 mg | ORAL_TABLET | Freq: Two times a day (BID) | ORAL | Status: DC
  Administered 2022-07-02 – 2022-07-15 (×25): 10 mg via ORAL
  Filled 2022-07-02 (×26): qty 1

## 2022-07-02 MED ORDER — CITALOPRAM HYDROBROMIDE 20 MG PO TABS
10.0000 mg | ORAL_TABLET | Freq: Every day | ORAL | Status: DC
  Administered 2022-07-03 – 2022-07-15 (×13): 10 mg via ORAL
  Filled 2022-07-02 (×13): qty 1

## 2022-07-02 MED ORDER — PHENYLEPHRINE 80 MCG/ML (10ML) SYRINGE FOR IV PUSH (FOR BLOOD PRESSURE SUPPORT)
PREFILLED_SYRINGE | INTRAVENOUS | Status: DC | PRN
  Administered 2022-07-02: 80 ug via INTRAVENOUS
  Administered 2022-07-02: 160 ug via INTRAVENOUS
  Administered 2022-07-02 (×2): 80 ug via INTRAVENOUS
  Administered 2022-07-02: 160 ug via INTRAVENOUS
  Administered 2022-07-02 (×2): 80 ug via INTRAVENOUS
  Administered 2022-07-02: 160 ug via INTRAVENOUS

## 2022-07-02 MED ORDER — INSULIN ASPART 100 UNIT/ML IJ SOLN: 0.0000 [IU] | Freq: Every day | INTRAMUSCULAR | Status: DC

## 2022-07-02 MED ORDER — CHLORHEXIDINE GLUCONATE 4 % EX LIQD
60.0000 mL | Freq: Once | CUTANEOUS | Status: DC
  Filled 2022-07-02: qty 60

## 2022-07-02 MED ORDER — MONTELUKAST SODIUM 10 MG PO TABS
10.0000 mg | ORAL_TABLET | Freq: Every day | ORAL | Status: DC
  Administered 2022-07-02 – 2022-07-14 (×13): 10 mg via ORAL
  Filled 2022-07-02 (×13): qty 1

## 2022-07-02 MED ORDER — CEFAZOLIN SODIUM-DEXTROSE 2-4 GM/100ML-% IV SOLN
2.0000 g | INTRAVENOUS | Status: AC
  Administered 2022-07-02: 2 g via INTRAVENOUS
  Filled 2022-07-02: qty 100

## 2022-07-02 MED ORDER — ONDANSETRON HCL 4 MG/2ML IJ SOLN
INTRAMUSCULAR | Status: DC | PRN
  Administered 2022-07-02: 4 mg via INTRAVENOUS

## 2022-07-02 MED ORDER — INSULIN ASPART 100 UNIT/ML IJ SOLN
0.0000 [IU] | Freq: Three times a day (TID) | INTRAMUSCULAR | Status: DC
  Administered 2022-07-02: 3 [IU] via SUBCUTANEOUS
  Administered 2022-07-02 – 2022-07-10 (×8): 2 [IU] via SUBCUTANEOUS
  Administered 2022-07-11 – 2022-07-12 (×2): 3 [IU] via SUBCUTANEOUS
  Administered 2022-07-13: 2 [IU] via SUBCUTANEOUS
  Administered 2022-07-13: 3 [IU] via SUBCUTANEOUS
  Administered 2022-07-14 – 2022-07-15 (×3): 2 [IU] via SUBCUTANEOUS

## 2022-07-02 MED ORDER — FENTANYL CITRATE PF 50 MCG/ML IJ SOSY
50.0000 ug | PREFILLED_SYRINGE | INTRAMUSCULAR | Status: DC | PRN
  Administered 2022-07-02 (×2): 50 ug via INTRAVENOUS
  Filled 2022-07-02 (×2): qty 1

## 2022-07-02 MED ORDER — LIDOCAINE 2% (20 MG/ML) 5 ML SYRINGE
INTRAMUSCULAR | Status: AC
  Filled 2022-07-02: qty 5

## 2022-07-02 MED ORDER — TRANEXAMIC ACID-NACL 1000-0.7 MG/100ML-% IV SOLN
1000.0000 mg | Freq: Once | INTRAVENOUS | Status: AC
  Administered 2022-07-02: 1000 mg via INTRAVENOUS
  Filled 2022-07-02: qty 100

## 2022-07-02 SURGICAL SUPPLY — 48 items
ADH SKN CLS APL DERMABOND .7 (GAUZE/BANDAGES/DRESSINGS) ×1
APL PRP STRL LF DISP 70% ISPRP (MISCELLANEOUS) ×1
BAG COUNTER SPONGE SURGICOUNT (BAG) IMPLANT
BAG SPNG CNTER NS LX DISP (BAG)
BIT DRILL CANN 16 HIP (BIT) IMPLANT
BIT DRILL CANN STP 6/9 HIP (BIT) IMPLANT
BIT DRILL LONG 4.2 (BIT) IMPLANT
BIT DRILL TAPERED 10 (BIT) IMPLANT
BRUSH SCRUB EZ PLAIN DRY (MISCELLANEOUS) ×2 IMPLANT
CHLORAPREP W/TINT 26 (MISCELLANEOUS) ×1 IMPLANT
COVER PERINEAL POST (MISCELLANEOUS) ×1 IMPLANT
COVER SURGICAL LIGHT HANDLE (MISCELLANEOUS) ×1 IMPLANT
DERMABOND ADVANCED .7 DNX12 (GAUZE/BANDAGES/DRESSINGS) ×1 IMPLANT
DRAPE C-ARM 35X43 STRL (DRAPES) ×1 IMPLANT
DRAPE IMP U-DRAPE 54X76 (DRAPES) ×2 IMPLANT
DRAPE INCISE IOBAN 66X45 STRL (DRAPES) ×1 IMPLANT
DRAPE STERI IOBAN 125X83 (DRAPES) ×1 IMPLANT
DRAPE SURG 17X23 STRL (DRAPES) ×2 IMPLANT
DRAPE U-SHAPE 47X51 STRL (DRAPES) ×1 IMPLANT
DRESSING MEPILEX FLEX 4X4 (GAUZE/BANDAGES/DRESSINGS) IMPLANT
DRSG MEPILEX BORDER 4X4 (GAUZE/BANDAGES/DRESSINGS) ×1 IMPLANT
DRSG MEPILEX BORDER 4X8 (GAUZE/BANDAGES/DRESSINGS) ×1 IMPLANT
DRSG MEPILEX FLEX 4X4 (GAUZE/BANDAGES/DRESSINGS) ×2
ELECT REM PT RETURN 9FT ADLT (ELECTROSURGICAL) ×1
ELECTRODE REM PT RTRN 9FT ADLT (ELECTROSURGICAL) ×1 IMPLANT
GLOVE BIO SURGEON STRL SZ 6.5 (GLOVE) ×3 IMPLANT
GLOVE BIO SURGEON STRL SZ7.5 (GLOVE) ×4 IMPLANT
GLOVE BIOGEL PI IND STRL 6.5 (GLOVE) ×1 IMPLANT
GLOVE BIOGEL PI IND STRL 7.5 (GLOVE) ×1 IMPLANT
GOWN STRL REUS W/ TWL LRG LVL3 (GOWN DISPOSABLE) ×1 IMPLANT
GOWN STRL REUS W/TWL LRG LVL3 (GOWN DISPOSABLE) ×1
GUIDEWIRE 3.2X400 (WIRE) IMPLANT
KIT BASIN OR (CUSTOM PROCEDURE TRAY) ×1 IMPLANT
KIT TURNOVER KIT B (KITS) ×1 IMPLANT
MANIFOLD NEPTUNE II (INSTRUMENTS) ×1 IMPLANT
NAIL TROCH FIX 10X170 130 (Nail) IMPLANT
NS IRRIG 1000ML POUR BTL (IV SOLUTION) ×1 IMPLANT
PACK GENERAL/GYN (CUSTOM PROCEDURE TRAY) ×1 IMPLANT
PAD ARMBOARD 7.5X6 YLW CONV (MISCELLANEOUS) ×2 IMPLANT
SCREW LOCK STAR 5X38 (Screw) IMPLANT
SCREW TFNA FENS 10.35X80 (Screw) IMPLANT
SUT MNCRL AB 3-0 PS2 18 (SUTURE) ×1 IMPLANT
SUT VIC AB 0 CT1 27 (SUTURE) ×1
SUT VIC AB 0 CT1 27XBRD ANBCTR (SUTURE) IMPLANT
SUT VIC AB 2-0 CT1 27 (SUTURE) ×1
SUT VIC AB 2-0 CT1 TAPERPNT 27 (SUTURE) ×2 IMPLANT
TOWEL GREEN STERILE (TOWEL DISPOSABLE) ×2 IMPLANT
WATER STERILE IRR 1000ML POUR (IV SOLUTION) ×1 IMPLANT

## 2022-07-02 NOTE — Anesthesia Preprocedure Evaluation (Addendum)
Anesthesia Evaluation  Patient identified by MRN, date of birth, ID band Patient awake    Reviewed: Allergy & Precautions, NPO status , Patient's Chart, lab work & pertinent test results  Airway Mallampati: II       Dental   Pulmonary neg pulmonary ROS   breath sounds clear to auscultation       Cardiovascular hypertension,  Rhythm:Regular Rate:Normal     Neuro/Psych  Headaches PSYCHIATRIC DISORDERS         GI/Hepatic Neg liver ROS,GERD  ,,  Endo/Other  diabetes    Renal/GU Renal disease     Musculoskeletal  (+) Arthritis ,    Abdominal   Peds  Hematology   Anesthesia Other Findings   Reproductive/Obstetrics                             Anesthesia Physical Anesthesia Plan  ASA: 3  Anesthesia Plan: General   Post-op Pain Management:    Induction:   PONV Risk Score and Plan: 3 and Ondansetron, Dexamethasone and Midazolam  Airway Management Planned: Oral ETT  Additional Equipment:   Intra-op Plan:   Post-operative Plan: Possible Post-op intubation/ventilation  Informed Consent: I have reviewed the patients History and Physical, chart, labs and discussed the procedure including the risks, benefits and alternatives for the proposed anesthesia with the patient or authorized representative who has indicated his/her understanding and acceptance.     Dental advisory given  Plan Discussed with: CRNA, Anesthesiologist and Surgeon  Anesthesia Plan Comments:        Anesthesia Quick Evaluation

## 2022-07-02 NOTE — Progress Notes (Signed)
Able to administer oral medications crushed in ice cream

## 2022-07-02 NOTE — Progress Notes (Addendum)
MRSA PCR/nasal swab obtained and is negative per LAB. Results are under [facility], Lab will fax over results if unable to view in EPIC. Pt scheduled for IM Nailing at 1000 as of now.

## 2022-07-02 NOTE — Anesthesia Procedure Notes (Signed)
Procedure Name: Intubation Date/Time: 07/02/2022 1:13 PM  Performed by: Harden Mo, CRNAPre-anesthesia Checklist: Patient identified, Emergency Drugs available, Suction available and Patient being monitored Patient Re-evaluated:Patient Re-evaluated prior to induction Oxygen Delivery Method: Circle System Utilized Preoxygenation: Pre-oxygenation with 100% oxygen Induction Type: IV induction Ventilation: Mask ventilation without difficulty Laryngoscope Size: Miller and 2 Grade View: Grade I Tube type: Oral Tube size: 7.0 mm Number of attempts: 1 Airway Equipment and Method: Stylet and Oral airway Placement Confirmation: ETT inserted through vocal cords under direct vision, positive ETCO2 and breath sounds checked- equal and bilateral Secured at: 22 cm Tube secured with: Tape Dental Injury: Teeth and Oropharynx as per pre-operative assessment

## 2022-07-02 NOTE — Op Note (Signed)
Orthopaedic Surgery Operative Note (CSN: [id number] ) Date of Surgery: 07/02/2022  Admit Date: 07/01/2022   Diagnoses: Pre-Op Diagnoses: Right intertrochanteric femur fracture  Post-Op Diagnosis: Same  Procedures: CPT 27245-Cephalomedullary nailing of right intertrochanteric femur fracture  Surgeons : Primary: Shona Needles, MD  Assistant: None  Location: OR 3   Anesthesia:General   Antibiotics: Ancef 2g preop   Tourniquet time: None    Estimated Blood Loss: 30 mL  Complications:None  Specimens:None   Implants: Implant Name Type Inv. Item Serial No. Manufacturer Lot No. LRB No. Used Action  NAIL TROCH FIX [id number] 130 - [id number] Nail NAIL TROCH FIX 10X170 130  [employer] [id number] Right 1 Implanted  SCREW LOCK STAR 5X38 - [id number] Screw SCREW LOCK STAR 5X38  [employer] ON TRAY Right 1 Implanted  SCREW TFNA FENS 10.35X80 - [id number] Screw SCREW TFNA FENS 10.35X80  [employer] ON TRAY Right 1 Implanted     Indications for Surgery: 71 year old female with a history of dementia who sustained a ground-level fall and a right intertrochanteric femur fracture.  Due to the unstable nature of her injury I felt that she was indicated for cephalomedullary nailing of her right hip.  Risks and benefits were discussed with the patient's sister.  Risks included but not limited to bleeding, infection, malunion, nonunion, hardware failure, hardware irritation, nerve or blood vessel injury, DVT, even the possibility anesthetic complications.  She agreed to proceed with surgery and consent was obtained.  Operative Findings: Cephalomedullary nailing of right intertrochanteric/basicervical femoral neck fracture using [employer] TFNA 10x111m with a 80 mm lag screw  Procedure: The patient was identified in the preoperative holding area. Consent was confirmed with the patient and their family and all questions were answered. The operative extremity was marked after  confirmation with the patient. she was then brought back to the operating room by our anesthesia colleagues.  She was placed under general anesthetic and carefully transferred over to a Hana table.  All bony prominences were well-padded.  Traction was applied to the right lower extremity.  Fluoroscopic imaging confirmed adequate reduction of the fracture.  The right lower extremity was then prepped and draped in usual sterile fashion.  A timeout was performed to verify the patient, the procedure, and the extremity.  Preoperative antibiotics were dosed.  Small incision proximal to the greater trochanter was made and carried down through skin and subcutaneous tissue.  A threaded guidewire was placed at the tip of the greater trochanter.  This was advanced into the proximal metaphysis.  I confirmed positioning with fluoroscopy and then used an entry reamer to enter the medullary canal.  I then passed a 10 x 170 mm nail down the center of the canal.  I then used the targeting arm to direct a threaded guidewire up into the head/neck segment.  I confirmed adequate tip apex distance using AP and lateral fluoroscopic imaging.  I then measured the length for the lag screw drilled the lag screw path and then placed the 80 mm lag screw with excellent fixation.  I confirmed positioning with fluoroscopy.  I then used the targeting arm to place a distal interlocking screw.  The proximal nail was statically locked.  The targeting arm was removed.  The incisions were copiously irrigated.  Final x-rays were obtained.  Layered closure of 2-0 Vicryl and 3-0 Monocryl with Dermabond was used to close the skin.  Sterile dressings were applied.  The patient was then awoke from anesthesia and taken to the PACU in  stable condition.  Post Op Plan/Instructions: Patient be weightbearing as tolerated to the right lower extremity.  She will receive postoperative Ancef.  She will be placed on aspirin for DVT prophylaxis but Lovenox while  inpatient.  We will have her mobilize with physical and Occupational Therapy.  I was present and performed the entire surgery.  Patrecia Pace, PA-C did assist me throughout the case. An assistant was necessary given the difficulty in approach, maintenance of reduction and ability to instrument the fracture.   Katha Hamming, MD [facility]

## 2022-07-02 NOTE — Consult Note (Signed)
Orthopaedic Trauma Service (OTS) Consult   Patient ID: [name] MRN: [id number] DOB/AGE: [birth year] 71 y.o.  Reason for Consult:Right hip fracture Referring Physician: Dr. Carter Kitten, MD [facility]  HPI: [name] is an 71 y.o. female who is being seen in consultation at the request of Dr. Mardelle Matte for evaluation of right hip fracture.  Patient has a history of severe dementia as well as type 2 diabetes who is at a memory facility.  She had a fall and landed on her right hip she had immediate pain and inability to bear weight.  At baseline she does ambulate according to her sister who is her healthcare power of attorney.  Due to OR availability I was asked to take over care by Dr. Mardelle Matte.  Patient was seen and evaluated on 5 N.  No family is at bedside but I was able to obtain some history from her sister.  Patient does not respond and does not provide a history.  Patient sister states that she ambulates at baseline.  No other known injuries.  Admitted from [facility] yesterday evening.  Past Medical History:  Diagnosis Date   Arthritis    fingers   Dementia ([place])    Depression    GERD (gastroesophageal reflux disease)    Headache(784.0)    History of hypertension    NO MEDS NOW   History of kidney stones    Hydronephrosis, right    Hyperlipidemia    Renal calculi    LEFT   Type 2 diabetes mellitus (HCC)    Urge urinary incontinence    Wears glasses     Past Surgical History:  Procedure Laterality Date   ANTERIOR AND POSTERIOR REPAIR  07/13/2011   Procedure: ANTERIOR (CYSTOCELE) AND POSTERIOR REPAIR (RECTOCELE);  Surgeon: Arloa Koh;  Location: [place] [facility];  Service: Gynecology;  Laterality: N/A;   BILIARY STENT PLACEMENT  08/22/2011   Procedure: BILIARY STENT PLACEMENT;  Surgeon: Missy Sabins, MD;  Location: [place];  Service: Endoscopy;  Laterality: N/A;  54fx 5cm stent for bile leak. placed in CBD   BLADDER SUSPENSION  07/13/2011    Procedure: TRANSVAGINAL TAPE (TVT) PROCEDURE;  Surgeon: BArloa Koh  [facility];  Service: Gynecology;  Laterality: N/A;   CYSTO/ RETROGRADE PYELOGRAM/ RIGHT URETERAL DILATION  10-28-2008   CYSTO/ RIGHT URETERAL BALLOON DILATION/ RIGHT URETEROSCOPIC STONE EXTRACTIO/ STENT  11-29-2010   CYSTO/ URETEROSCOPIC LASER LITHO OF STONES  2009  &  09-30-2010   CYSTOSCOPY  07/13/2011   Procedure: CYSTOSCOPY;  Surgeon: BArloa Koh  [facility];  Service: Gynecology;  Laterality: N/A;   CYSTOSCOPY/RETROGRADE/URETEROSCOPY/STONE EXTRACTION WITH BASKET Bilateral 05/05/2013   Procedure: CYSTOSCOPY/RETROGRADE/URETEROSCOPY/STONE EXTRACTION WITH BASKET   Procedure:Cysto, Right Retrograde Pyelogram, Left Ureteroscopy with Holmium Laser, Extraction of Stones;  Surgeon: SFranchot Gallo MD;  [facility]  Service: Urology;  Laterality: Bilateral;   ERCP  08/22/2011   Procedure: ENDOSCOPIC RETROGRADE CHOLANGIOPANCREATOGRAPHY (ERCP);  Surgeon: JMissy Sabins MD;  [facility];  Service: Endoscopy;  Laterality: N/A;   ERCP  12/15/2011   Procedure: ENDOSCOPIC RETROGRADE CHOLANGIOPANCREATOGRAPHY (ERCP);  Surgeon: JMissy Sabins MD;  [facility];  Service: Endoscopy;  Laterality: N/A;  stent removal/mod. sedation   EXTRACORPOREAL SHOCK WAVE LITHOTRIPSY Left 04-17-2011   HOLMIUM LASER APPLICATION Left 92/12/4268  Procedure: HOLMIUM LASER APPLICATION;  Surgeon: SFranchot Gallo MD;  [facility]  Service: Urology;  Laterality: Left;   LAPAROSCOPIC  ASSISTED VAGINAL HYSTERECTOMY  07/13/2011   Procedure: LAPAROSCOPIC ASSISTED VAGINAL HYSTERECTOMY;  Surgeon: Arloa Koh;  Location: [place] [facility];  Service: Gynecology;  Laterality: N/A;   LAPAROSCOPIC CHOLECYSTECTOMY  08-17-2011   SALPINGOOPHORECTOMY  07/13/2011   Procedure: SALPINGO OOPHERECTOMY;  Surgeon: Arloa Koh;  Location: [place] [facility];  Service: Gynecology;  Laterality: Bilateral;    Family  History  Problem Relation Age of Onset   Dementia Father    Anesthesia problems Neg Hx    Hypotension Neg Hx    Malignant hyperthermia Neg Hx    Pseudochol deficiency Neg Hx     Social History:  reports that she has never smoked. She has never used smokeless tobacco. She reports that she does not drink alcohol and does not use drugs.  Allergies:  Allergies  Allergen Reactions   Flomax [Tamsulosin Hcl] Swelling    JAWS   Sulfa Antibiotics Swelling    Medications:  No current facility-administered medications on file prior to encounter.   Current Outpatient Medications on File Prior to Encounter  Medication Sig Dispense Refill   atorvastatin (LIPITOR) 20 MG tablet Take 40 mg by mouth at bedtime.     cholecalciferol (VITAMIN D3) 25 MCG (1000 UNIT) tablet Take 2,000 Units by mouth daily.     divalproex (DEPAKOTE) 125 MG DR tablet Take 125-250 mg by mouth 3 (three) times daily. 1 tablet at 0800 and 1400, 2 tablets at bedtime (1900)     docusate sodium (COLACE) 100 MG capsule Take 100 mg by mouth 2 (two) times daily.     famotidine (PEPCID) 10 MG tablet Take 10 mg by mouth 2 (two) times daily.     fluticasone (FLONASE) 50 MCG/ACT nasal spray Place 2 sprays into both nostrils daily.     mirtazapine (REMERON) 30 MG tablet Take 30 mg by mouth at bedtime.     montelukast (SINGULAIR) 10 MG tablet Take 10 mg by mouth at bedtime.     ondansetron (ZOFRAN-ODT) 4 MG disintegrating tablet Take 4 mg by mouth every 8 (eight) hours as needed.     vitamin B-12 (CYANOCOBALAMIN) 500 MCG tablet Take 500 mcg by mouth daily.     cefdinir (OMNICEF) 300 MG capsule Take 1 capsule (300 mg total) by mouth every 12 (twelve) hours. (Patient not taking: Reported on 07/01/2022) 8 capsule 0     ROS: Unable to obtain due to her dementia.  Exam: Blood pressure 134/70, pulse 93, temperature 98.5 F (36.9 C), temperature source Oral, resp. rate 18, height '5\' 2"'$  (1.575 m), weight 50.9 kg, SpO2 99 %. General:  Sleeping comfortably.  Does not awaken. Orientation: Not oriented to person or place. Mood and Affect: Does not cooperate Gait: Unable to assess due to her fracture Coordination and balance: Within normal limits  Right lower extremity: Hip and knee are flexed up.  She does not tolerate any motion of her leg.  No obvious deformity through her leg or foot and ankle.  She has not follow commands.  She does have a warm well-perfused foot.  Unable to to cooperate with neuro exam.  Left lower extremity: Skin without lesions. No tenderness to palpation. Full painless ROM, full strength in each muscle groups without evidence of instability.   Medical Decision Making: Data: Imaging: X-rays and CT scan are reviewed which shows a basicervical femoral neck fracture  Labs:  Results for orders placed or performed during the hospital encounter of 07/01/22 (from the past 24 hour(s))  Basic metabolic panel  Status: Abnormal   Collection Time: 07/01/22  3:45 PM  Result Value Ref Range   Sodium 142 135 - 145 mmol/L   Potassium 4.0 3.5 - 5.1 mmol/L   Chloride 109 98 - 111 mmol/L   CO2 25 22 - 32 mmol/L   Glucose, Bld 172 (H) 70 - 99 mg/dL   BUN 26 (H) 8 - 23 mg/dL   Creatinine, Ser 1.16 (H) 0.44 - 1.00 mg/dL   Calcium 9.3 8.9 - 10.3 mg/dL   GFR, Estimated 51 (L) >60 mL/min   Anion gap 8 5 - 15  CBC with Differential     Status: Abnormal   Collection Time: 07/01/22  3:45 PM  Result Value Ref Range   WBC 9.1 4.0 - 10.5 K/uL   RBC 3.89 3.87 - 5.11 MIL/uL   Hemoglobin 11.2 (L) 12.0 - 15.0 g/dL   HCT 36.6 36.0 - 46.0 %   MCV 94.1 80.0 - 100.0 fL   MCH 28.8 26.0 - 34.0 pg   MCHC 30.6 30.0 - 36.0 g/dL   RDW 13.2 11.5 - 15.5 %   Platelets 144 (L) 150 - 400 K/uL   nRBC 0.0 0.0 - 0.2 %   Neutrophils Relative % 81 %   Neutro Abs 7.3 1.7 - 7.7 K/uL   Lymphocytes Relative 10 %   Lymphs Abs 0.9 0.7 - 4.0 K/uL   Monocytes Relative 9 %   Monocytes Absolute 0.8 0.1 - 1.0 K/uL   Eosinophils Relative 0  %   Eosinophils Absolute 0.0 0.0 - 0.5 K/uL   Basophils Relative 0 %   Basophils Absolute 0.0 0.0 - 0.1 K/uL   Immature Granulocytes 0 %   Abs Immature Granulocytes 0.02 0.00 - 0.07 K/uL  Urinalysis, Routine w reflex microscopic Urine, Clean Catch     Status: Abnormal   Collection Time: 07/01/22  9:21 PM  Result Value Ref Range   Color, Urine YELLOW YELLOW   APPearance TURBID (A) CLEAR   Specific Gravity, Urine 1.015 1.005 - 1.030   pH 6.0 5.0 - 8.0   Glucose, UA NEGATIVE NEGATIVE mg/dL   Hgb urine dipstick SMALL (A) NEGATIVE   Bilirubin Urine NEGATIVE NEGATIVE   Ketones, ur 20 (A) NEGATIVE mg/dL   Protein, ur >=300 (A) NEGATIVE mg/dL   Nitrite POSITIVE (A) NEGATIVE   Leukocytes,Ua LARGE (A) NEGATIVE   RBC / HPF 11-20 0 - 5 RBC/hpf   WBC, UA >50 (H) 0 - 5 WBC/hpf   Bacteria, UA MANY (A) NONE SEEN   WBC Clumps PRESENT   CBG monitoring, ED     Status: Abnormal   Collection Time: 07/02/22 12:53 AM  Result Value Ref Range   Glucose-Capillary 145 (H) 70 - 99 mg/dL  Glucose, capillary     Status: Abnormal   Collection Time: 07/02/22  8:22 AM  Result Value Ref Range   Glucose-Capillary 123 (H) 70 - 99 mg/dL     Imaging or Labs ordered: None  Medical history and chart was reviewed and case discussed with medical provider.  Assessment/Plan: 71 year old female with history of severe dementia with a right basicervical femoral neck fracture.  I have reviewed the imaging and I feel that her fracture would be amendable to cephalomedullary nailing.  I do not feel is a typical femoral neck fracture.  I feel like the blood supply would be still intact.  I discussed risk and benefits of surgical intervention with the patient's sister.  Risks include but not limited to bleeding, infection, malunion,  nonunion, hardware failure, heart rotation, nerve or blood vessel injury, DVT, even the possibility anesthetic complications.  They agreed to proceed with surgery and consent was obtained.  Shona Needles, MD [facility] [phone] (office) [email or web address]

## 2022-07-02 NOTE — Anesthesia Postprocedure Evaluation (Signed)
Anesthesia Post Note  Patient: [name]  Procedure(s) Performed: INTRAMEDULLARY (IM) NAIL INTERTROCHANTERIC (Right: Hip)     Patient location during evaluation: PACU Anesthesia Type: General Level of consciousness: awake Pain management: pain level controlled Vital Signs Assessment: post-procedure vital signs reviewed and stable Respiratory status: spontaneous breathing Cardiovascular status: stable Postop Assessment: no apparent nausea or vomiting Anesthetic complications: no   No notable events documented.  Last Vitals:  Vitals:   07/02/22 1500 07/02/22 1510  BP: 126/74 129/77  Pulse: (!) 105 (!) 110  Resp: 12 20  Temp: 36.6 C 37 C  SpO2: 96% 93%    Last Pain:  Vitals:   07/02/22 1510  TempSrc: Axillary  PainSc:                  Alexsys Eskin

## 2022-07-02 NOTE — H&P (View-Only) (Signed)
Orthopaedic Trauma Service (OTS) Consult   Patient ID: [name] MRN: [id number] DOB/AGE: [birth year] 71 y.o.  Reason for Consult:Right hip fracture Referring Physician: Dr. Carter Kitten, MD [facility]  HPI: [name] is an 71 y.o. female who is being seen in consultation at the request of Dr. Mardelle Matte for evaluation of right hip fracture.  Patient has a history of severe dementia as well as type 2 diabetes who is at a memory facility.  She had a fall and landed on her right hip she had immediate pain and inability to bear weight.  At baseline she does ambulate according to her sister who is her healthcare power of attorney.  Due to OR availability I was asked to take over care by Dr. Mardelle Matte.  Patient was seen and evaluated on 5 N.  No family is at bedside but I was able to obtain some history from her sister.  Patient does not respond and does not provide a history.  Patient sister states that she ambulates at baseline.  No other known injuries.  Admitted from [facility] yesterday evening.  Past Medical History:  Diagnosis Date   Arthritis    fingers   Dementia ([place])    Depression    GERD (gastroesophageal reflux disease)    Headache(784.0)    History of hypertension    NO MEDS NOW   History of kidney stones    Hydronephrosis, right    Hyperlipidemia    Renal calculi    LEFT   Type 2 diabetes mellitus (HCC)    Urge urinary incontinence    Wears glasses     Past Surgical History:  Procedure Laterality Date   ANTERIOR AND POSTERIOR REPAIR  07/13/2011   Procedure: ANTERIOR (CYSTOCELE) AND POSTERIOR REPAIR (RECTOCELE);  Surgeon: Arloa Koh;  Location: [place] [facility];  Service: Gynecology;  Laterality: N/A;   BILIARY STENT PLACEMENT  08/22/2011   Procedure: BILIARY STENT PLACEMENT;  Surgeon: Missy Sabins, MD;  Location: [place];  Service: Endoscopy;  Laterality: N/A;  64fx 5cm stent for bile leak. placed in CBD   BLADDER SUSPENSION  07/13/2011    Procedure: TRANSVAGINAL TAPE (TVT) PROCEDURE;  Surgeon: BArloa Koh  [facility];  Service: Gynecology;  Laterality: N/A;   CYSTO/ RETROGRADE PYELOGRAM/ RIGHT URETERAL DILATION  10-28-2008   CYSTO/ RIGHT URETERAL BALLOON DILATION/ RIGHT URETEROSCOPIC STONE EXTRACTIO/ STENT  11-29-2010   CYSTO/ URETEROSCOPIC LASER LITHO OF STONES  2009  &  09-30-2010   CYSTOSCOPY  07/13/2011   Procedure: CYSTOSCOPY;  Surgeon: BArloa Koh  [facility];  Service: Gynecology;  Laterality: N/A;   CYSTOSCOPY/RETROGRADE/URETEROSCOPY/STONE EXTRACTION WITH BASKET Bilateral 05/05/2013   Procedure: CYSTOSCOPY/RETROGRADE/URETEROSCOPY/STONE EXTRACTION WITH BASKET   Procedure:Cysto, Right Retrograde Pyelogram, Left Ureteroscopy with Holmium Laser, Extraction of Stones;  Surgeon: SFranchot Gallo MD;  [facility]  Service: Urology;  Laterality: Bilateral;   ERCP  08/22/2011   Procedure: ENDOSCOPIC RETROGRADE CHOLANGIOPANCREATOGRAPHY (ERCP);  Surgeon: JMissy Sabins MD;  [facility];  Service: Endoscopy;  Laterality: N/A;   ERCP  12/15/2011   Procedure: ENDOSCOPIC RETROGRADE CHOLANGIOPANCREATOGRAPHY (ERCP);  Surgeon: JMissy Sabins MD;  [facility];  Service: Endoscopy;  Laterality: N/A;  stent removal/mod. sedation   EXTRACORPOREAL SHOCK WAVE LITHOTRIPSY Left 04-17-2011   HOLMIUM LASER APPLICATION Left 91/01/5536  Procedure: HOLMIUM LASER APPLICATION;  Surgeon: SFranchot Gallo MD;  [facility]  Service: Urology;  Laterality: Left;   LAPAROSCOPIC  ASSISTED VAGINAL HYSTERECTOMY  07/13/2011   Procedure: LAPAROSCOPIC ASSISTED VAGINAL HYSTERECTOMY;  Surgeon: Arloa Koh;  Location: [place] [facility];  Service: Gynecology;  Laterality: N/A;   LAPAROSCOPIC CHOLECYSTECTOMY  08-17-2011   SALPINGOOPHORECTOMY  07/13/2011   Procedure: SALPINGO OOPHERECTOMY;  Surgeon: Arloa Koh;  Location: [place] [facility];  Service: Gynecology;  Laterality: Bilateral;    Family  History  Problem Relation Age of Onset   Dementia Father    Anesthesia problems Neg Hx    Hypotension Neg Hx    Malignant hyperthermia Neg Hx    Pseudochol deficiency Neg Hx     Social History:  reports that she has never smoked. She has never used smokeless tobacco. She reports that she does not drink alcohol and does not use drugs.  Allergies:  Allergies  Allergen Reactions   Flomax [Tamsulosin Hcl] Swelling    JAWS   Sulfa Antibiotics Swelling    Medications:  No current facility-administered medications on file prior to encounter.   Current Outpatient Medications on File Prior to Encounter  Medication Sig Dispense Refill   atorvastatin (LIPITOR) 20 MG tablet Take 40 mg by mouth at bedtime.     cholecalciferol (VITAMIN D3) 25 MCG (1000 UNIT) tablet Take 2,000 Units by mouth daily.     divalproex (DEPAKOTE) 125 MG DR tablet Take 125-250 mg by mouth 3 (three) times daily. 1 tablet at 0800 and 1400, 2 tablets at bedtime (1900)     docusate sodium (COLACE) 100 MG capsule Take 100 mg by mouth 2 (two) times daily.     famotidine (PEPCID) 10 MG tablet Take 10 mg by mouth 2 (two) times daily.     fluticasone (FLONASE) 50 MCG/ACT nasal spray Place 2 sprays into both nostrils daily.     mirtazapine (REMERON) 30 MG tablet Take 30 mg by mouth at bedtime.     montelukast (SINGULAIR) 10 MG tablet Take 10 mg by mouth at bedtime.     ondansetron (ZOFRAN-ODT) 4 MG disintegrating tablet Take 4 mg by mouth every 8 (eight) hours as needed.     vitamin B-12 (CYANOCOBALAMIN) 500 MCG tablet Take 500 mcg by mouth daily.     cefdinir (OMNICEF) 300 MG capsule Take 1 capsule (300 mg total) by mouth every 12 (twelve) hours. (Patient not taking: Reported on 07/01/2022) 8 capsule 0     ROS: Unable to obtain due to her dementia.  Exam: Blood pressure 134/70, pulse 93, temperature 98.5 F (36.9 C), temperature source Oral, resp. rate 18, height '5\' 2"'$  (1.575 m), weight 50.9 kg, SpO2 99 %. General:  Sleeping comfortably.  Does not awaken. Orientation: Not oriented to person or place. Mood and Affect: Does not cooperate Gait: Unable to assess due to her fracture Coordination and balance: Within normal limits  Right lower extremity: Hip and knee are flexed up.  She does not tolerate any motion of her leg.  No obvious deformity through her leg or foot and ankle.  She has not follow commands.  She does have a warm well-perfused foot.  Unable to to cooperate with neuro exam.  Left lower extremity: Skin without lesions. No tenderness to palpation. Full painless ROM, full strength in each muscle groups without evidence of instability.   Medical Decision Making: Data: Imaging: X-rays and CT scan are reviewed which shows a basicervical femoral neck fracture  Labs:  Results for orders placed or performed during the hospital encounter of 07/01/22 (from the past 24 hour(s))  Basic metabolic panel  Status: Abnormal   Collection Time: 07/01/22  3:45 PM  Result Value Ref Range   Sodium 142 135 - 145 mmol/L   Potassium 4.0 3.5 - 5.1 mmol/L   Chloride 109 98 - 111 mmol/L   CO2 25 22 - 32 mmol/L   Glucose, Bld 172 (H) 70 - 99 mg/dL   BUN 26 (H) 8 - 23 mg/dL   Creatinine, Ser 1.16 (H) 0.44 - 1.00 mg/dL   Calcium 9.3 8.9 - 10.3 mg/dL   GFR, Estimated 51 (L) >60 mL/min   Anion gap 8 5 - 15  CBC with Differential     Status: Abnormal   Collection Time: 07/01/22  3:45 PM  Result Value Ref Range   WBC 9.1 4.0 - 10.5 K/uL   RBC 3.89 3.87 - 5.11 MIL/uL   Hemoglobin 11.2 (L) 12.0 - 15.0 g/dL   HCT 36.6 36.0 - 46.0 %   MCV 94.1 80.0 - 100.0 fL   MCH 28.8 26.0 - 34.0 pg   MCHC 30.6 30.0 - 36.0 g/dL   RDW 13.2 11.5 - 15.5 %   Platelets 144 (L) 150 - 400 K/uL   nRBC 0.0 0.0 - 0.2 %   Neutrophils Relative % 81 %   Neutro Abs 7.3 1.7 - 7.7 K/uL   Lymphocytes Relative 10 %   Lymphs Abs 0.9 0.7 - 4.0 K/uL   Monocytes Relative 9 %   Monocytes Absolute 0.8 0.1 - 1.0 K/uL   Eosinophils Relative 0  %   Eosinophils Absolute 0.0 0.0 - 0.5 K/uL   Basophils Relative 0 %   Basophils Absolute 0.0 0.0 - 0.1 K/uL   Immature Granulocytes 0 %   Abs Immature Granulocytes 0.02 0.00 - 0.07 K/uL  Urinalysis, Routine w reflex microscopic Urine, Clean Catch     Status: Abnormal   Collection Time: 07/01/22  9:21 PM  Result Value Ref Range   Color, Urine YELLOW YELLOW   APPearance TURBID (A) CLEAR   Specific Gravity, Urine 1.015 1.005 - 1.030   pH 6.0 5.0 - 8.0   Glucose, UA NEGATIVE NEGATIVE mg/dL   Hgb urine dipstick SMALL (A) NEGATIVE   Bilirubin Urine NEGATIVE NEGATIVE   Ketones, ur 20 (A) NEGATIVE mg/dL   Protein, ur >=300 (A) NEGATIVE mg/dL   Nitrite POSITIVE (A) NEGATIVE   Leukocytes,Ua LARGE (A) NEGATIVE   RBC / HPF 11-20 0 - 5 RBC/hpf   WBC, UA >50 (H) 0 - 5 WBC/hpf   Bacteria, UA MANY (A) NONE SEEN   WBC Clumps PRESENT   CBG monitoring, ED     Status: Abnormal   Collection Time: 07/02/22 12:53 AM  Result Value Ref Range   Glucose-Capillary 145 (H) 70 - 99 mg/dL  Glucose, capillary     Status: Abnormal   Collection Time: 07/02/22  8:22 AM  Result Value Ref Range   Glucose-Capillary 123 (H) 70 - 99 mg/dL     Imaging or Labs ordered: None  Medical history and chart was reviewed and case discussed with medical provider.  Assessment/Plan: 71 year old female with history of severe dementia with a right basicervical femoral neck fracture.  I have reviewed the imaging and I feel that her fracture would be amendable to cephalomedullary nailing.  I do not feel is a typical femoral neck fracture.  I feel like the blood supply would be still intact.  I discussed risk and benefits of surgical intervention with the patient's sister.  Risks include but not limited to bleeding, infection, malunion,  nonunion, hardware failure, heart rotation, nerve or blood vessel injury, DVT, even the possibility anesthetic complications.  They agreed to proceed with surgery and consent was obtained.  Shona Needles, MD [facility] [phone] (office) [email or web address]

## 2022-07-02 NOTE — Transfer of Care (Signed)
Immediate Anesthesia Transfer of Care Note  Patient: [name]  Procedure(s) Performed: INTRAMEDULLARY (IM) NAIL INTERTROCHANTERIC (Right: Hip)  Patient Location: PACU  Anesthesia Type:General  Level of Consciousness: awake and drowsy  Airway & Oxygen Therapy: Patient Spontanous Breathing  Post-op Assessment: Report given to RN and Post -op Vital signs reviewed and stable  Post vital signs: Reviewed and stable  Last Vitals:  Vitals Value Taken Time  BP 130/75 07/02/22 1430  Temp    Pulse 104 07/02/22 1434  Resp 15 07/02/22 1434  SpO2 94 % 07/02/22 1434  Vitals shown include unvalidated device data.  Last Pain:  Vitals:   07/02/22 1215  TempSrc: Oral  PainSc:          Complications: No notable events documented.

## 2022-07-02 NOTE — Progress Notes (Signed)
[facility] PROGRESS NOTE    Progress Note  [name]  KNL:[id number] DOB: 1951-08-23 DOA: 07/01/2022 PCP: Caryl Bis, MD     Brief Narrative:   CONCETTA [name] is an 71 y.o. female past medical history significant for severe dementia, diabetes mellitus type 2 hyperlipidemia resides at a memory care unit facility had a fall started complaining of right hip pain, x-ray and CT of her hip show impacted right femoral neck fracture, with anterior relation orthopedic surgery was consulted, orthopedic surgery to see the patient for possible surgery 'Sunday or Monday. Patient cannot provide any history.   Assessment/Plan:   Closed right hip fracture (HCC) Keep the patient n.p.o. Continue narcotics for pain start her on bowel regimen. IV fluids. Orthopedic surgery to see today.  Diabetes mellitus type 2 with hyperglycemia: Blood glucose fairly controlled continue CBGs every 4 continue sliding scale.  Hyperlipidemia N.p.o. restart statin tomorrow.  GERD (gastroesophageal reflux disease) PPI  Elevated blood pressure without a diagnosis of hypertension: Likely due to pain once her pain was controlled blood pressure improved.   DVT prophylaxis: lovenox Family Communication: Sister [phone] unable to contact her Status is: Inpatient Remains inpatient appropriate because: Acute right hip fracture    Code Status:     Code Status Orders  (From admission, onward)           Start     Ordered   07/02/22 0041  Full code  Continuous        11'$ /05/23 0040           Code Status History     Date Active Date Inactive Code Status Order ID Comments User Context   01/26/2021 2117 01/28/2021 2115 Full Code [id number]  Bethena Roys, MD Inpatient   09/12/2011 0458 09/16/2011 1736 Full Code [id number]  Royce Macadamia, RN Inpatient   08/21/2011 2041 08/26/2011 1935 Full Code [id number]  Theodis Blaze, MD Inpatient         IV Access:   Peripheral  IV   Procedures and diagnostic studies:   CT HIP RIGHT WO CONTRAST  Result Date: 07/01/2022 CLINICAL DATA:  Hip surgical planning. Femoral neck fracture on radiograph EXAM: CT OF THE RIGHT HIP WITHOUT CONTRAST TECHNIQUE: Multidetector CT imaging of the right hip was performed according to the standard protocol. Multiplanar CT image reconstructions were also generated. RADIATION DOSE REDUCTION: This exam was performed according to the departmental dose-optimization program which includes automated exposure control, adjustment of the mA and/or kV according to patient size and/or use of iterative reconstruction technique. COMPARISON:  Radiograph earlier today. FINDINGS: Bones/Joint/Cartilage Impacted right basicervical femoral neck fracture with mild apex anterior angulation. There is mild displacement. Femoral head is seated in the acetabulum. Mild osteoarthritis of the right hip. Pubic rami and included hemipelvis are intact. Ligaments Suboptimally assessed by CT. Muscles and Tendons Mild edema/hemorrhage in the obturator musculature. Soft tissues Subcutaneous edema laterally.  There is no intrapelvic free fluid. IMPRESSION: Impacted right basicervical femoral neck fracture with mild apex anterior angulation and mild displacement. Electronically Signed   By: Keith Rake M.D.   On: 07/01/2022 18:48   DG Hip Unilat W or Wo Pelvis 2-3 Views Right  Result Date: 07/01/2022 CLINICAL DATA:  Right-sided hip and thigh pain after fall. EXAM: DG HIP (WITH OR WITHOUT PELVIS) 2-3V RIGHT COMPARISON:  None. FINDINGS: Although limited by positioning, there is an impacted right femoral neck fracture. Mild rotation of the femur. The femoral head is seated. The bones are  diffusely under mineralized. Mild bilateral hip degenerative change. Intact pubic rami. IMPRESSION: Impacted right femoral neck fracture. Electronically Signed   By: Keith Rake M.D.   On: 07/01/2022 15:45   CT Cervical Spine Wo  Contrast  Result Date: 07/01/2022 CLINICAL DATA:  Neck trauma (Age >= 65y) EXAM: CT CERVICAL SPINE WITHOUT CONTRAST TECHNIQUE: Multidetector CT imaging of the cervical spine was performed without intravenous contrast. Multiplanar CT image reconstructions were also generated. RADIATION DOSE REDUCTION: This exam was performed according to the departmental dose-optimization program which includes automated exposure control, adjustment of the mA and/or kV according to patient size and/or use of iterative reconstruction technique. COMPARISON:  Cervical spine CT 05/14/2022 FINDINGS: Alignment: No traumatic subluxation. Similar reversal of normal lordosis. Skull base and vertebrae: No acute fracture. Vertebral body heights are maintained. The dens and skull base are intact. Soft tissues and spinal canal: No prevertebral fluid or swelling. No visible canal hematoma. Disc levels: Degenerative disc disease from C3-C4 through C6-C7, stable. Upper chest: No acute or unexpected findings. Other: None. IMPRESSION: Stable degenerative change in the cervical spine without acute fracture or subluxation. Electronically Signed   By: Keith Rake M.D.   On: 07/01/2022 15:41   CT Head Wo Contrast  Result Date: 07/01/2022 CLINICAL DATA:  Post fall. EXAM: CT HEAD WITHOUT CONTRAST TECHNIQUE: Contiguous axial images were obtained from the base of the skull through the vertex without intravenous contrast. RADIATION DOSE REDUCTION: This exam was performed according to the departmental dose-optimization program which includes automated exposure control, adjustment of the mA and/or kV according to patient size and/or use of iterative reconstruction technique. COMPARISON:  05/14/2022 FINDINGS: Brain: No acute intracranial hemorrhage. No subdural or extra-axial collection. Stable degree of atrophy and chronic small vessel ischemia. No evidence of acute infarct. No midline shift, mass effect or hydrocephalus. Vascular: Atherosclerosis of  skullbase vasculature without hyperdense vessel or abnormal calcification. Skull: No fracture or focal lesion. Sinuses/Orbits: Paranasal sinuses and mastoid air cells are clear. The visualized orbits are unremarkable. Other: No confluent scalp hematoma. IMPRESSION: 1. No acute intracranial abnormality. No skull fracture. 2. Stable atrophy and chronic small vessel ischemia. Electronically Signed   By: Keith Rake M.D.   On: 07/01/2022 15:38     Medical Consultants:   None.   Subjective:    [name] her pain is controlled.  Objective:    Vitals:   07/02/22 0145 07/02/22 0237 07/02/22 0300 07/02/22 0406  BP: 109/61   111/64  Pulse: (!) 105   97  Resp: 15   16  Temp: 99.7 F (37.6 C)   98.8 F (37.1 C)  TempSrc: Oral   Oral  SpO2: 97%  97% 99%  Weight:  50.9 kg    Height:  '5\' 2"'$  (1.575 m)     SpO2: 99 %   Intake/Output Summary (Last 24 hours) at 07/02/2022 0714 Last data filed at 07/02/2022 0300 Gross per 24 hour  Intake 16.17 ml  Output --  Net 16.17 ml   Filed Weights   07/02/22 0237  Weight: 50.9 kg    Exam: General exam: In no acute distress. Respiratory system: Good air movement and clear to auscultation. Cardiovascular system: S1 & S2 heard, RRR. No JVD. Gastrointestinal system: Abdomen is nondistended, soft and nontender.  Extremities: No pedal edema. Skin: No rashes, lesions or ulcers    Data Reviewed:    Labs: Basic Metabolic Panel: Recent Labs  Lab 07/01/22 1545  NA 142  K 4.0  CL 109  CO2 25  GLUCOSE 172*  BUN 26*  CREATININE 1.16*  CALCIUM 9.3   GFR Estimated Creatinine Clearance: 35.7 mL/min (A) (by C-G formula based on SCr of 1.16 mg/dL (H)). Liver Function Tests: No results for input(s): "AST", "ALT", "ALKPHOS", "BILITOT", "PROT", "ALBUMIN" in the last 168 hours. No results for input(s): "LIPASE", "AMYLASE" in the last 168 hours. No results for input(s): "AMMONIA" in the last 168 hours. Coagulation profile No results  for input(s): "INR", "PROTIME" in the last 168 hours. COVID-19 Labs  No results for input(s): "DDIMER", "FERRITIN", "LDH", "CRP" in the last 72 hours.  Lab Results  Component Value Date   [place] NEGATIVE 01/26/2021    CBC: Recent Labs  Lab 07/01/22 1545  WBC 9.1  NEUTROABS 7.3  HGB 11.2*  HCT 36.6  MCV 94.1  PLT 144*   Cardiac Enzymes: No results for input(s): "CKTOTAL", "CKMB", "CKMBINDEX", "TROPONINI" in the last 168 hours. BNP (last 3 results) No results for input(s): "PROBNP" in the last 8760 hours. CBG: Recent Labs  Lab 07/02/22 0053  GLUCAP 145*   D-Dimer: No results for input(s): "DDIMER" in the last 72 hours. Hgb A1c: No results for input(s): "HGBA1C" in the last 72 hours. Lipid Profile: No results for input(s): "CHOL", "HDL", "LDLCALC", "TRIG", "CHOLHDL", "LDLDIRECT" in the last 72 hours. Thyroid function studies: No results for input(s): "TSH", "T4TOTAL", "T3FREE", "THYROIDAB" in the last 72 hours.  Invalid input(s): "FREET3" Anemia work up: No results for input(s): "VITAMINB12", "FOLATE", "FERRITIN", "TIBC", "IRON", "RETICCTPCT" in the last 72 hours. Sepsis Labs: Recent Labs  Lab 07/01/22 1545  WBC 9.1   Microbiology No results found for this or any previous visit (from the past 240 hour(s)).   Medications:    acetaminophen  1,000 mg Oral Once   atorvastatin  40 mg Oral QHS   chlorhexidine  60 mL Topical Once   citalopram  10 mg Oral Daily   divalproex  125 mg Oral BID   divalproex  250 mg Oral Q24H   famotidine  10 mg Oral BID   insulin aspart  0-15 Units Subcutaneous TID WC   insulin aspart  0-5 Units Subcutaneous QHS   mirtazapine  30 mg Oral QHS   montelukast  10 mg Oral QHS   povidone-iodine  2 Application Topical Once   Continuous Infusions:  sodium chloride 75 mL/hr at 07/02/22 0245    ceFAZolin (ANCEF) IV     tranexamic acid Stopped (07/02/22 0047)      LOS: 1 day   Charlynne Cousins  [facility]  07/02/2022, 7:14 AM

## 2022-07-02 NOTE — Interval H&P Note (Signed)
History and Physical Interval Note:  07/02/2022 12:10 PM  [name]  has presented today for surgery, with the diagnosis of RIGHT HIP FRACTURE.  The various methods of treatment have been discussed with the patient and family. After consideration of risks, benefits and other options for treatment, the patient has consented to  Procedure(s): INTRAMEDULLARY (IM) NAIL INTERTROCHANTERIC (Right) as a surgical intervention.  The patient's history has been reviewed, patient examined, no change in status, stable for surgery.  I have reviewed the patient's chart and labs.  Questions were answered to the patient's satisfaction.     Lennette Bihari P Rynlee [place]

## 2022-07-02 NOTE — TOC Initial Note (Signed)
Transition of Care [facility]) - Initial/Assessment Note    Patient Details  Name: [name] MRN: [id number] Date of Birth: 12-16-1950  Transition of Care [facility]) CM/SW Contact:    Tom-Johnson, Renea Ee, RN Phone Number: 07/02/2022, 1:36 PM  Clinical Narrative:                  Patient is admitted from a memory care facility after a fall from a Rt hip fracture. Undergoing surgery today. No TOC needs or recommendations noted at this time. CM will continue to follow as patient progresses with care towards discharge.    Barriers to Discharge: Continued Medical Work up   Patient Goals and CMS Choice Patient states their goals for this hospitalization and ongoing recovery are:: To return to memory care      Expected Discharge Plan and Services                                                Prior Living Arrangements/Services     Patient language and need for interpreter reviewed:: Yes        Need for Family Participation in Patient Care: Yes (Comment) Care giver support system in place?: Yes (comment)   Criminal Activity/Legal Involvement Pertinent to Current Situation/Hospitalization: No - Comment as needed  Activities of Daily Living      Permission Sought/Granted                  Emotional Assessment Appearance:: Appears stated age   Affect (typically observed): Accepting, Appropriate, Calm Orientation: : Oriented to Self, Oriented to Place Alcohol / Substance Use: Not Applicable Psych Involvement: No (comment)  Admission diagnosis:  Closed right hip fracture (HCC) [S72.001A] Closed fracture of neck of right femur, initial encounter ([place]) [S72.001A] Fall, initial encounter [W19.XXXA] Patient Active Problem List   Diagnosis Date Noted   Dementia without behavioral disturbance ([place]) 07/01/2022   Closed right hip fracture ([place]) 07/01/2022   Acute cystitis without hematuria    Acute metabolic encephalopathy 93/81/8299   Nausea and vomiting  09/12/2011   Hypercalcemia 09/12/2011   UTI (lower urinary tract infection) 09/12/2011   Anastomotic leak of biliary tree 08/26/2011   Diabetes mellitus ([place]) 08/21/2011   Hypokalemia 08/21/2011   Tachycardia 08/21/2011   Biliary sludge 08/21/2011   Hyperlipidemia    GERD (gastroesophageal reflux disease)    Hypertension    PCP:  Caryl Bis, MD Pharmacy:   [place], [place] - [address] [place] [address] Phone: [phone] Fax: [phone]     Social Determinants of Health (SDOH) Interventions    Readmission Risk Interventions     No data to display

## 2022-07-03 ENCOUNTER — Encounter (HOSPITAL_COMMUNITY): Payer: Self-pay | Admitting: Student

## 2022-07-03 DIAGNOSIS — F0390 Unspecified dementia without behavioral disturbance: Secondary | ICD-10-CM

## 2022-07-03 DIAGNOSIS — S72001A Fracture of unspecified part of neck of right femur, initial encounter for closed fracture: Secondary | ICD-10-CM | POA: Diagnosis not present

## 2022-07-03 LAB — CBC
HCT: 34.1 % — ABNORMAL LOW (ref 36.0–46.0)
Hemoglobin: 10.7 g/dL — ABNORMAL LOW (ref 12.0–15.0)
MCH: 29 pg (ref 26.0–34.0)
MCHC: 31.4 g/dL (ref 30.0–36.0)
MCV: 92.4 fL (ref 80.0–100.0)
Platelets: 122 10*3/uL — ABNORMAL LOW (ref 150–400)
RBC: 3.69 MIL/uL — ABNORMAL LOW (ref 3.87–5.11)
RDW: 13.2 % (ref 11.5–15.5)
WBC: 9.5 10*3/uL (ref 4.0–10.5)
nRBC: 0 % (ref 0.0–0.2)

## 2022-07-03 LAB — BASIC METABOLIC PANEL
Anion gap: 10 (ref 5–15)
BUN: 16 mg/dL (ref 8–23)
CO2: 23 mmol/L (ref 22–32)
Calcium: 9.3 mg/dL (ref 8.9–10.3)
Chloride: 109 mmol/L (ref 98–111)
Creatinine, Ser: 0.75 mg/dL (ref 0.44–1.00)
GFR, Estimated: >60 mL/min (ref >60)
Glucose, Bld: 132 mg/dL — ABNORMAL HIGH (ref 70–99)
Potassium: 3.7 mmol/L (ref 3.5–5.1)
Sodium: 142 mmol/L (ref 135–145)

## 2022-07-03 LAB — GLUCOSE, CAPILLARY
Glucose-Capillary: 147 mg/dL — ABNORMAL HIGH (ref 70–99)
Glucose-Capillary: 95 mg/dL (ref 70–99)
Glucose-Capillary: 111 mg/dL — ABNORMAL HIGH (ref 70–99)
Glucose-Capillary: 108 mg/dL — ABNORMAL HIGH (ref 70–99)
Glucose-Capillary: 115 mg/dL — ABNORMAL HIGH (ref 70–99)

## 2022-07-03 LAB — VITAMIN D 25 HYDROXY (VIT D DEFICIENCY, FRACTURES): Vit D, 25-Hydroxy: 49.72 ng/mL (ref 30–100)

## 2022-07-03 MED ORDER — DIVALPROEX SODIUM 125 MG PO DR TAB
125.0000 mg | DELAYED_RELEASE_TABLET | ORAL | Status: DC
  Administered 2022-07-03 – 2022-07-15 (×25): 125 mg via ORAL
  Filled 2022-07-03 (×26): qty 1

## 2022-07-03 MED ORDER — DIVALPROEX SODIUM 250 MG PO DR TAB
250.0000 mg | DELAYED_RELEASE_TABLET | ORAL | Status: DC
  Administered 2022-07-03 – 2022-07-14 (×11): 250 mg via ORAL
  Filled 2022-07-03 (×12): qty 1

## 2022-07-03 MED ORDER — DIVALPROEX SODIUM 125 MG PO DR TAB: 125.0000 mg | DELAYED_RELEASE_TABLET | Freq: Every day | ORAL | Status: DC

## 2022-07-03 MED ORDER — HYDROCODONE-ACETAMINOPHEN 5-325 MG PO TABS: 1.0000 | ORAL_TABLET | Freq: Four times a day (QID) | ORAL | 0 refills | Status: DC | PRN

## 2022-07-03 MED ORDER — ACETAMINOPHEN 325 MG PO TABS: 325.0000 mg | ORAL_TABLET | Freq: Four times a day (QID) | ORAL | Status: DC | PRN

## 2022-07-03 MED ORDER — ASPIRIN 325 MG PO TBEC: 325.0000 mg | DELAYED_RELEASE_TABLET | Freq: Every day | ORAL | 0 refills | Status: AC

## 2022-07-03 MED ORDER — DIVALPROEX SODIUM 125 MG PO DR TAB: 125.0000 mg | DELAYED_RELEASE_TABLET | Freq: Two times a day (BID) | ORAL | Status: DC

## 2022-07-03 NOTE — Progress Notes (Signed)
[facility] PROGRESS NOTE    Progress Note  [name]  NFA:[id number] DOB: 1951/02/01 DOA: 07/01/2022 PCP: Caryl Bis, MD     Brief Narrative:   [name] is an 71 y.o. female past medical history significant for severe dementia, diabetes mellitus type 2 hyperlipidemia resides at a memory care unit facility had a fall started complaining of right hip pain, x-ray and CT of her hip show impacted right femoral neck fracture, with anterior relation orthopedic surgery was consulted, orthopedic surgery to see the patient for possible surgery 'Sunday or Monday. Patient cannot provide any history.   Assessment/Plan:   Closed right hip fracture (HCC) Status post several medullary nailing of right intertrochanteric femur Orthopedic surgery recommended Norco and aspirin daily for DVT prophylaxis KVO IV fluids. Physical therapy evaluation is pending.  Diabetes mellitus type 2 with hyperglycemia: A1c of 5.5 she probably can go home off oral hypoglycemic agents.  Hyperlipidemia Restart statins.  GERD (gastroesophageal reflux disease) PPI  Elevated blood pressure without a diagnosis of hypertension: Likely due to pain once her pain was controlled blood pressure improved.   DVT prophylaxis: lovenox Family Communication: Sister [phone] unable to contact her Status is: Inpatient Remains inpatient appropriate because: Acute right hip fracture    Code Status:     Code Status Orders  (From admission, onward)           Start     Ordered   07/02/22 0041  Full code  Continuous        11'$ /05/23 0040           Code Status History     Date Active Date Inactive Code Status Order ID Comments User Context   01/26/2021 2117 01/28/2021 2115 Full Code [id number]  Bethena Roys, MD Inpatient   09/12/2011 0458 09/16/2011 1736 Full Code [id number]  Royce Macadamia, RN Inpatient   08/21/2011 2041 08/26/2011 1935 Full Code [id number]  Theodis Blaze, MD Inpatient          IV Access:   Peripheral IV   Procedures and diagnostic studies:   DG FEMUR PORT, MIN 2 VIEWS RIGHT  Result Date: 07/02/2022 CLINICAL DATA:  Postop. EXAM: RIGHT FEMUR PORTABLE 2 VIEW COMPARISON:  Preoperative imaging. FINDINGS: Intramedullary nail with distal locking and trans trochanteric screw fixation of proximal femur fracture. Recent postsurgical change includes air and edema in the joint space and soft tissues. Distal femur is intact. IMPRESSION: ORIF of proximal femur fracture. No immediate postoperative complication. Electronically Signed   By: Keith Rake M.D.   On: 07/02/2022 19:50   DG FEMUR, MIN 2 VIEWS RIGHT  Result Date: 07/02/2022 CLINICAL DATA:  Elective surgery. EXAM: RIGHT FEMUR 2 VIEWS COMPARISON:  Preoperative imaging. FINDINGS: Five fluoroscopic spot views of the right hip obtained in the operating room. Intramedullary nail with distal locking and trans trochanteric screw fixation no proximal femur fracture. Fluoroscopy time 54.3 seconds. Dose 5.71 mGy. IMPRESSION: Intraoperative fluoroscopy during right femoral intramedullary nail and trans trochanteric screw fixation. Electronically Signed   By: Keith Rake M.D.   On: 07/02/2022 14:50   DG C-Arm 1-60 Min-No Report  Result Date: 07/02/2022 Fluoroscopy was utilized by the requesting physician.  No radiographic interpretation.   CT HIP RIGHT WO CONTRAST  Result Date: 07/01/2022 CLINICAL DATA:  Hip surgical planning. Femoral neck fracture on radiograph EXAM: CT OF THE RIGHT HIP WITHOUT CONTRAST TECHNIQUE: Multidetector CT imaging of the right hip was performed according to the standard protocol. Multiplanar CT  image reconstructions were also generated. RADIATION DOSE REDUCTION: This exam was performed according to the departmental dose-optimization program which includes automated exposure control, adjustment of the mA and/or kV according to patient size and/or use of iterative reconstruction technique.  COMPARISON:  Radiograph earlier today. FINDINGS: Bones/Joint/Cartilage Impacted right basicervical femoral neck fracture with mild apex anterior angulation. There is mild displacement. Femoral head is seated in the acetabulum. Mild osteoarthritis of the right hip. Pubic rami and included hemipelvis are intact. Ligaments Suboptimally assessed by CT. Muscles and Tendons Mild edema/hemorrhage in the obturator musculature. Soft tissues Subcutaneous edema laterally.  There is no intrapelvic free fluid. IMPRESSION: Impacted right basicervical femoral neck fracture with mild apex anterior angulation and mild displacement. Electronically Signed   By: Keith Rake M.D.   On: 07/01/2022 18:48   DG Hip Unilat W or Wo Pelvis 2-3 Views Right  Result Date: 07/01/2022 CLINICAL DATA:  Right-sided hip and thigh pain after fall. EXAM: DG HIP (WITH OR WITHOUT PELVIS) 2-3V RIGHT COMPARISON:  None. FINDINGS: Although limited by positioning, there is an impacted right femoral neck fracture. Mild rotation of the femur. The femoral head is seated. The bones are diffusely under mineralized. Mild bilateral hip degenerative change. Intact pubic rami. IMPRESSION: Impacted right femoral neck fracture. Electronically Signed   By: Keith Rake M.D.   On: 07/01/2022 15:45   CT Cervical Spine Wo Contrast  Result Date: 07/01/2022 CLINICAL DATA:  Neck trauma (Age >= 65y) EXAM: CT CERVICAL SPINE WITHOUT CONTRAST TECHNIQUE: Multidetector CT imaging of the cervical spine was performed without intravenous contrast. Multiplanar CT image reconstructions were also generated. RADIATION DOSE REDUCTION: This exam was performed according to the departmental dose-optimization program which includes automated exposure control, adjustment of the mA and/or kV according to patient size and/or use of iterative reconstruction technique. COMPARISON:  Cervical spine CT 05/14/2022 FINDINGS: Alignment: No traumatic subluxation. Similar reversal of normal  lordosis. Skull base and vertebrae: No acute fracture. Vertebral body heights are maintained. The dens and skull base are intact. Soft tissues and spinal canal: No prevertebral fluid or swelling. No visible canal hematoma. Disc levels: Degenerative disc disease from C3-C4 through C6-C7, stable. Upper chest: No acute or unexpected findings. Other: None. IMPRESSION: Stable degenerative change in the cervical spine without acute fracture or subluxation. Electronically Signed   By: Keith Rake M.D.   On: 07/01/2022 15:41   CT Head Wo Contrast  Result Date: 07/01/2022 CLINICAL DATA:  Post fall. EXAM: CT HEAD WITHOUT CONTRAST TECHNIQUE: Contiguous axial images were obtained from the base of the skull through the vertex without intravenous contrast. RADIATION DOSE REDUCTION: This exam was performed according to the departmental dose-optimization program which includes automated exposure control, adjustment of the mA and/or kV according to patient size and/or use of iterative reconstruction technique. COMPARISON:  05/14/2022 FINDINGS: Brain: No acute intracranial hemorrhage. No subdural or extra-axial collection. Stable degree of atrophy and chronic small vessel ischemia. No evidence of acute infarct. No midline shift, mass effect or hydrocephalus. Vascular: Atherosclerosis of skullbase vasculature without hyperdense vessel or abnormal calcification. Skull: No fracture or focal lesion. Sinuses/Orbits: Paranasal sinuses and mastoid air cells are clear. The visualized orbits are unremarkable. Other: No confluent scalp hematoma. IMPRESSION: 1. No acute intracranial abnormality. No skull fracture. 2. Stable atrophy and chronic small vessel ischemia. Electronically Signed   By: Keith Rake M.D.   On: 07/01/2022 15:38     Medical Consultants:   None.   Subjective:    [name] no  complaints today.  Objective:    Vitals:   07/02/22 1510 07/02/22 2000 07/03/22 0431 07/03/22 0831  BP: 129/77 (!)  153/86 (!) 141/82 (!) 150/121  Pulse: (!) 110 95 96 (!) 103  Resp: '20 18 18   '$ Temp: 98.6 F (37 C) 98.8 F (37.1 C)  98.3 F (36.8 C)  TempSrc: Axillary Oral  Oral  SpO2: 93% 96% 95% 98%  Weight:      Height:       SpO2: 98 %   Intake/Output Summary (Last 24 hours) at 07/03/2022 0859 Last data filed at 07/03/2022 0306 Gross per 24 hour  Intake 2526.5 ml  Output 30 ml  Net 2496.5 ml    Filed Weights   07/02/22 0237 07/02/22 1215  Weight: 50.9 kg 50.9 kg    Exam: General exam: In no acute distress. Respiratory system: Good air movement and clear to auscultation. Cardiovascular system: S1 & S2 heard, RRR. No JVD. Gastrointestinal system: Abdomen is nondistended, soft and nontender.  Extremities: No pedal edema. Skin: No rashes, lesions or ulcers Psychiatry: No judgment or insight appear normal. Mood & affect appropriate.    Data Reviewed:    Labs: Basic Metabolic Panel: Recent Labs  Lab 07/01/22 1545 07/02/22 1542 07/03/22 0429  NA 142  --  142  K 4.0  --  3.7  CL 109  --  109  CO2 25  --  23  GLUCOSE 172*  --  132*  BUN 26*  --  16  CREATININE 1.16* 0.88 0.75  CALCIUM 9.3  --  9.3    GFR Estimated Creatinine Clearance: 51.8 mL/min (by C-G formula based on SCr of 0.75 mg/dL). Liver Function Tests: No results for input(s): "AST", "ALT", "ALKPHOS", "BILITOT", "PROT", "ALBUMIN" in the last 168 hours. No results for input(s): "LIPASE", "AMYLASE" in the last 168 hours. No results for input(s): "AMMONIA" in the last 168 hours. Coagulation profile No results for input(s): "INR", "PROTIME" in the last 168 hours. COVID-19 Labs  No results for input(s): "DDIMER", "FERRITIN", "LDH", "CRP" in the last 72 hours.  Lab Results  Component Value Date   Luck NEGATIVE 01/26/2021    CBC: Recent Labs  Lab 07/01/22 1545 07/02/22 1542 07/03/22 0429  WBC 9.1 8.4 9.5  NEUTROABS 7.3  --   --   HGB 11.2* 10.7* 10.7*  HCT 36.6 34.9* 34.1*  MCV 94.1 93.6  92.4  PLT 144* 114* 122*    Cardiac Enzymes: No results for input(s): "CKTOTAL", "CKMB", "CKMBINDEX", "TROPONINI" in the last 168 hours. BNP (last 3 results) No results for input(s): "PROBNP" in the last 8760 hours. CBG: Recent Labs  Lab 07/02/22 0148 07/02/22 0822 07/02/22 1228 07/02/22 1703 07/02/22 2035  GLUCAP 147* 123* 101* 161* 149*    D-Dimer: No results for input(s): "DDIMER" in the last 72 hours. Hgb A1c: Recent Labs    07/01/22 1545  HGBA1C 5.5   Lipid Profile: No results for input(s): "CHOL", "HDL", "LDLCALC", "TRIG", "CHOLHDL", "LDLDIRECT" in the last 72 hours. Thyroid function studies: No results for input(s): "TSH", "T4TOTAL", "T3FREE", "THYROIDAB" in the last 72 hours.  Invalid input(s): "FREET3" Anemia work up: No results for input(s): "VITAMINB12", "FOLATE", "FERRITIN", "TIBC", "IRON", "RETICCTPCT" in the last 72 hours. Sepsis Labs: Recent Labs  Lab 07/01/22 1545 07/02/22 1542 07/03/22 0429  WBC 9.1 8.4 9.5    Microbiology Recent Results (from the past 240 hour(s))  Surgical pcr screen     Status: None   Collection Time: 07/02/22  2:07 AM  Result Value Ref Range Status   MRSA, PCR NEGATIVE NEGATIVE Final   Staphylococcus aureus NEGATIVE NEGATIVE Final    Comment: (NOTE) The Xpert SA Assay (FDA approved for NASAL specimens in patients 52 years of age and older), is one component of a comprehensive surveillance program. It is not intended to diagnose infection nor to guide or monitor treatment. Performed at [facility], [place] [address], [place], [place] [address]   Surgical pcr screen     Status: None   Collection Time: 07/02/22 12:07 PM   Specimen: Nasal Mucosa; Nasal Swab  Result Value Ref Range Status   MRSA, PCR NEGATIVE NEGATIVE Final   Staphylococcus aureus NEGATIVE NEGATIVE Final    Comment: (NOTE) The Xpert SA Assay (FDA approved for NASAL specimens in patients 19 years of age and older), is one component of a  comprehensive surveillance program. It is not intended to diagnose infection nor to guide or monitor treatment. Performed at [facility], [place] [address], [place], [place] [address]      Medications:    atorvastatin  40 mg Oral QHS   citalopram  10 mg Oral Daily   divalproex  125 mg Oral Q12H   docusate sodium  100 mg Oral BID   enoxaparin (LOVENOX) injection  40 mg Subcutaneous Q24H   famotidine  10 mg Oral BID   insulin aspart  0-15 Units Subcutaneous TID WC   insulin aspart  0-5 Units Subcutaneous QHS   mirtazapine  30 mg Oral QHS   montelukast  10 mg Oral QHS   polyethylene glycol  17 g Oral BID   Continuous Infusions:  sodium chloride 75 mL/hr at 07/03/22 0306    ceFAZolin (ANCEF) IV 2 g (07/03/22 0503)   methocarbamol (ROBAXIN) IV        LOS: 2 days   Charlynne Cousins  [facility]  07/03/2022, 8:59 AM

## 2022-07-03 NOTE — Progress Notes (Signed)
Physical Therapy Evaluation Patient Details Name: [name] MRN: [id number] DOB: 1951-08-26 Today's Date: 07/03/2022  History of Present Illness  71 yo female with onset of a fall in her memory unit was admitted on 11/4, had IM nailing for R intertrochanteric fracture with WBAT.  PMHx:  DM with hyperglycemia, HLD, GERD, HTN,  Clinical Impression  Pt was seen for mobility to get to side of bed, but is resistant to get up and sit due to confusion and pain.  Her efforts were resisting to PT helping her sit up, but with her PLOF does have potential to improve. Focus on strengthening and moving to stand as tolerated, with two person help to increase safety and ease her fears.        Recommendations for follow up therapy are one component of a multi-disciplinary discharge planning process, led by the attending physician.  Recommendations may be updated based on patient status, additional functional criteria and insurance authorization.  Follow Up Recommendations Skilled nursing-short term rehab (<3 hours/day) Can patient physically be transported by private vehicle: No    Assistance Recommended at Discharge Frequent or constant Supervision/Assistance  Patient can return home with the following  Two people to help with walking and/or transfers;A lot of help with bathing/dressing/bathroom;Direct supervision/assist for medications management;Direct supervision/assist for financial management;Assist for transportation    Equipment Recommendations Rolling walker (2 wheels)  Recommendations for Other Services       Functional Status Assessment Patient has had a recent decline in their functional status and demonstrates the ability to make significant improvements in function in a reasonable and predictable amount of time.     Precautions / Restrictions Precautions Precautions: Fall Precaution Comments: mitts, purwick, IV Restrictions Weight Bearing Restrictions: No      Mobility  Bed  Mobility Overal bed mobility: Needs Assistance Bed Mobility: Supine to Sit, Sit to Supine     Supine to sit: Max assist Sit to supine: Total assist   General bed mobility comments: did not assist to return to bed and needed continual support and cues to remain upright on side of bed    Transfers                   General transfer comment: deferred due to pt resistance    Ambulation/Gait               General Gait Details: unable to attempt  Stairs            Wheelchair Mobility    Modified Rankin (Stroke Patients Only)       Balance Overall balance assessment: Needs assistance, History of Falls Sitting-balance support: Feet supported, Single extremity supported Sitting balance-Leahy Scale: Zero Sitting balance - Comments: pt is actively resisting and therefore cannot definitely grade balance Postural control: Posterior lean, Right lateral lean                                   Pertinent Vitals/Pain Pain Assessment Pain Assessment: Faces Faces Pain Scale: Hurts even more Pain Location: R hip Pain Descriptors / Indicators: Guarding, Restless Pain Intervention(s): Limited activity within patient's tolerance, Monitored during session, Repositioned    Home Living Family/patient expects to be discharged to:: Skilled nursing facility                   Additional Comments: family not available and pt is poor historian    Prior Function  Prior Level of Function : Needs assist       Physical Assist : Mobility (physical) Mobility (physical): Gait   Mobility Comments: no history available on her need for AD       Hand Dominance   Dominant Hand:  (unsure)    Extremity/Trunk Assessment   Upper Extremity Assessment Upper Extremity Assessment: Defer to OT evaluation    Lower Extremity Assessment Lower Extremity Assessment: RLE deficits/detail RLE Deficits / Details: very minimal assistance to move RLE, resisting with  trunk and avoiding active RLE use RLE: Unable to fully assess due to pain RLE Coordination: decreased gross motor    Cervical / Trunk Assessment Cervical / Trunk Assessment: Other exceptions (pt is actively leaning back and to R side to avoid being moved)  Communication   Communication: No difficulties  Cognition Arousal/Alertness: Awake/alert Behavior During Therapy: Impulsive, Flat affect Overall Cognitive Status: History of cognitive impairments - at baseline                                 General Comments: from memory care        General Comments General comments (skin integrity, edema, etc.): pt was assisted to side of bed and began to actively resist.  Was not comfortable with moving RLE to don sock and that set up an active resistance thereafter.  Will continue to try to stand up but will probably need two for this to reassure her and manage her symptoms    Exercises     Assessment/Plan    PT Assessment Patient needs continued PT services  PT Problem List Decreased strength;Decreased range of motion;Decreased activity tolerance;Decreased balance;Decreased mobility;Decreased coordination;Decreased knowledge of use of DME;Decreased safety awareness;Decreased skin integrity;Pain       PT Treatment Interventions DME instruction;Gait training;Functional mobility training;Therapeutic activities;Therapeutic exercise;Balance training;Neuromuscular re-education;Patient/family education    PT Goals (Current goals can be found in the Care Plan section)  Acute Rehab PT Goals PT Goal Formulation: Patient unable to participate in goal setting Time For Goal Achievement: 07/17/22 Potential to Achieve Goals: Good    Frequency Min 3X/week     Co-evaluation               AM-PAC PT "6 Clicks" Mobility  Outcome Measure Help needed turning from your back to your side while in a flat bed without using bedrails?: A Lot Help needed moving from lying on your back to  sitting on the side of a flat bed without using bedrails?: A Lot Help needed moving to and from a bed to a chair (including a wheelchair)?: Total Help needed standing up from a chair using your arms (e.g., wheelchair or bedside chair)?: Total Help needed to walk in hospital room?: Total Help needed climbing 3-5 steps with a railing? : Total 6 Click Score: 8    End of Session   Activity Tolerance: Patient limited by fatigue;Patient limited by pain Patient left: in bed;with call bell/phone within reach;with bed alarm set Nurse Communication: Mobility status PT Visit Diagnosis: Muscle weakness (generalized) (M62.81);Pain;Difficulty in walking, not elsewhere classified (R26.2);History of falling (Z91.81) Pain - Right/Left: Right Pain - part of body: Hip    Time: 4650-3546 PT Time Calculation (min) (ACUTE ONLY): 17 min   Charges:   PT Evaluation $PT Eval Moderate Complexity: 1 Mod         Ramond Dial 07/03/2022, 1:21 PM  Mee Hives, PT PhD Acute Rehab Dept. Number:  [place] [phone] and [place]

## 2022-07-03 NOTE — Discharge Instructions (Signed)
Orthopaedic Trauma Service Discharge Instructions   General Discharge Instructions  WEIGHT BEARING STATUS:Weightbearing as tolerated  RANGE OF MOTION/ACTIVITY: Okay for unrestricted hip and knee motion as tolerated  Wound Care: Incisions can be left open to air if there is no drainage. If incision continues to have drainage, follow wound care instructions below. Okay to shower if no drainage from incisions.  DVT/PE prophylaxis: Aspirin 325 mg daily x30 days  Diet: as you were eating previously.  Can use over the counter stool softeners and bowel preparations, such as Miralax, to help with bowel movements.  Narcotics can be constipating.  Be sure to drink plenty of fluids  PAIN MEDICATION USE AND EXPECTATIONS  You have likely been given narcotic medications to help control your pain.  After a traumatic event that results in an fracture (broken bone) with or without surgery, it is ok to use narcotic pain medications to help control one's pain.  We understand that everyone responds to pain differently and each individual patient will be evaluated on a regular basis for the continued need for narcotic medications. Ideally, narcotic medication use should last no more than 6-8 weeks (coinciding with fracture healing).   As a patient it is your responsibility as well to monitor narcotic medication use and report the amount and frequency you use these medications when you come to your office visit.   We would also advise that if you are using narcotic medications, you should take a dose prior to therapy to maximize you participation.  IF YOU ARE ON NARCOTIC MEDICATIONS IT IS NOT PERMISSIBLE TO OPERATE A MOTOR VEHICLE (MOTORCYCLE/CAR/TRUCK/MOPED) OR HEAVY MACHINERY DO NOT MIX NARCOTICS WITH OTHER CNS (CENTRAL NERVOUS SYSTEM) DEPRESSANTS SUCH AS ALCOHOL   STOP SMOKING OR USING NICOTINE PRODUCTS!!!!  As discussed nicotine severely impairs your body's ability to heal surgical and traumatic wounds  but also impairs bone healing.  Wounds and bone heal by forming microscopic blood vessels (angiogenesis) and nicotine is a vasoconstrictor (essentially, shrinks blood vessels).  Therefore, if vasoconstriction occurs to these microscopic blood vessels they essentially disappear and are unable to deliver necessary nutrients to the healing tissue.  This is one modifiable factor that you can do to dramatically increase your chances of healing your injury.    (This means no smoking, no nicotine gum, patches, etc)  DO NOT USE NONSTEROIDAL ANTI-INFLAMMATORY DRUGS (NSAID'S)  Using products such as Advil (ibuprofen), Aleve (naproxen), Motrin (ibuprofen) for additional pain control during fracture healing can delay and/or prevent the healing response.  If you would like to take over the counter (OTC) medication, Tylenol (acetaminophen) is ok.  However, some narcotic medications that are given for pain control contain acetaminophen as well. Therefore, you should not exceed more than 4000 mg of tylenol in a day if you do not have liver disease.  Also note that there are may OTC medicines, such as cold medicines and allergy medicines that my contain tylenol as well.  If you have any questions about medications and/or interactions please ask your doctor/PA or your pharmacist.      ICE AND ELEVATE INJURED/OPERATIVE EXTREMITY  Using ice and elevating the injured extremity above your heart can help with swelling and pain control.  Icing in a pulsatile fashion, such as 20 minutes on and 20 minutes off, can be followed.    Do not place ice directly on skin. Make sure there is a barrier between to skin and the ice pack.    Using frozen items such as frozen  peas works well as the conform nicely to the are that needs to be iced.  USE AN ACE WRAP OR TED HOSE FOR SWELLING CONTROL  In addition to icing and elevation, Ace wraps or TED hose are used to help limit and resolve swelling.  It is recommended to use Ace wraps or TED  hose until you are informed to stop.    When using Ace Wraps start the wrapping distally (farthest away from the body) and wrap proximally (closer to the body)   Example: If you had surgery on your leg or thing and you do not have a splint on, start the ace wrap at the toes and work your way up to the thigh        If you had surgery on your upper extremity and do not have a splint on, start the ace wrap at your fingers and work your way up to the upper arm  [place]: [phone]   VISIT OUR WEBSITE FOR ADDITIONAL INFORMATION: [email or web address]    Discharge Wound Care Instructions  Do NOT apply any ointments, solutions or lotions to pin sites or surgical wounds.  These prevent needed drainage and even though solutions like hydrogen peroxide kill bacteria, they also damage cells lining the pin sites that help fight infection.  Applying lotions or ointments can keep the wounds moist and can cause them to breakdown and open up as well. This can increase the risk for infection. When in doubt call the office.  If any drainage is noted, use one layer of adaptic or Mepitel, then gauze, Kerlix, and an ace wrap. - These dressing supplies should be available at local medical supply stores [facility], [facility], etc) as well as Management consultant ([employer], [facility], [facility], etc)  Once the incision is completely dry and without drainage, it may be left open to air out.  Showering may begin 36-48 hours later.  Cleaning gently with soap and water.

## 2022-07-03 NOTE — Evaluation (Signed)
Occupational Therapy Evaluation Patient Details Name: [name] MRN: [id number] DOB: April 26, 1951 Today's Date: 07/03/2022   History of Present Illness 71 yo female with onset of a fall in her memory unit was admitted on 11/4, had IM nailing for R intertrochanteric fracture with WBAT.  PMHx:  DM with hyperglycemia, HLD, GERD, HTN,   Clinical Impression   Per chart, pt lives at [facility] in their memory care unit and ambulates independently with hx of falls. Pt presents with significant cognitive impairment and non sensical speech. She does not follow commands, but does state yes and no unreliably. Pt is dependent in all ADLs, assisted to eat her lunch meal with hand under hand initially and eventually total assist for increased caloric intake. Pt requires max to total assist for bed level mobility and demonstrate poor sitting balance. She will need SNF level rehab upon discharge.      Recommendations for follow up therapy are one component of a multi-disciplinary discharge planning process, led by the attending physician.  Recommendations may be updated based on patient status, additional functional criteria and insurance authorization.   Follow Up Recommendations  Skilled nursing-short term rehab (<3 hours/day)    Assistance Recommended at Discharge Frequent or constant Supervision/Assistance  Patient can return home with the following Two people to help with walking and/or transfers;A lot of help with bathing/dressing/bathroom;Assistance with feeding;Assistance with cooking/housework;Direct supervision/assist for medications management;Direct supervision/assist for financial management;Assist for transportation;Help with stairs or ramp for entrance    Functional Status Assessment  Patient has had a recent decline in their functional status and/or demonstrates limited ability to make significant improvements in function in a reasonable and predictable amount of time  Equipment  Recommendations       Recommendations for Other Services       Precautions / Restrictions Precautions Precautions: Fall Precaution Comments: mitts, purwick, IV Restrictions Weight Bearing Restrictions: No      Mobility Bed Mobility Overal bed mobility: Needs Assistance Bed Mobility: Supine to Sit, Sit to Supine     Supine to sit: Max assist Sit to supine: Total assist        Transfers                   General transfer comment: deferred due to pt increasing posterior lean with attempt to elicit standing      Balance Overall balance assessment: Needs assistance, History of Falls Sitting-balance support: Feet supported, Single extremity supported Sitting balance-Leahy Scale: Zero   Postural control: Posterior lean, Right lateral lean                                 ADL either performed or assessed with clinical judgement   ADL                                         General ADL Comments: Dependent in all ADLs, assisted pt to eat lunch meal.     Vision Patient Visual Report:  (appears to regard objects at close range and midline)       Perception     Praxis      Pertinent Vitals/Pain Pain Assessment Pain Assessment: Faces Faces Pain Scale: Hurts even more Pain Location: R hip Pain Descriptors / Indicators: Guarding, Restless Pain Intervention(s): Repositioned     Hand Dominance Right (unsure)  Extremity/Trunk Assessment Upper Extremity Assessment Upper Extremity Assessment: Generalized weakness   Lower Extremity Assessment Lower Extremity Assessment: Defer to PT evaluation RLE Deficits / Details: very minimal assistance to move RLE, resisting with trunk and avoiding active RLE use RLE: Unable to fully assess due to pain RLE Coordination: decreased gross motor   Cervical / Trunk Assessment Cervical / Trunk Assessment: Other exceptions (weakness)   Communication Communication Communication: Expressive  difficulties   Cognition Arousal/Alertness: Awake/alert Behavior During Therapy: Impulsive, Flat affect Overall Cognitive Status: History of cognitive impairments - at baseline                                 General Comments: from memory care     General Comments    Exercises     Shoulder Instructions      Home Living Family/patient expects to be discharged to:: Skilled nursing facility                                 Additional Comments: family not available and pt is poor historian      Prior Functioning/Environment Prior Level of Function : Needs assist       Physical Assist : Mobility (physical) Mobility (physical): Gait   Mobility Comments: Pt was ambulator          OT Problem List: Decreased strength;Decreased cognition;Decreased activity tolerance;Impaired balance (sitting and/or standing);Decreased knowledge of use of DME or AE;Pain      OT Treatment/Interventions: DME and/or AE instruction;Self-care/ADL training;Therapeutic activities;Balance training    OT Goals(Current goals can be found in the care plan section) Acute Rehab OT Goals OT Goal Formulation: Patient unable to participate in goal setting Time For Goal Achievement: 07/17/22 Potential to Achieve Goals: Fair  OT Frequency: Min 2X/week    Co-evaluation              AM-PAC OT "6 Clicks" Daily Activity     Outcome Measure Help from another person eating meals?: Total Help from another person taking care of personal grooming?: Total Help from another person toileting, which includes using toliet, bedpan, or urinal?: Total Help from another person bathing (including washing, rinsing, drying)?: Total Help from another person to put on and taking off regular upper body clothing?: Total Help from another person to put on and taking off regular lower body clothing?: Total 6 Click Score: 6   End of Session Nurse Communication: Mobility status  Activity Tolerance:  Patient limited by pain;Other (comment) Patient left: in bed;with call bell/phone within reach;with bed alarm set  OT Visit Diagnosis: Muscle weakness (generalized) (M62.81)                Time: 1120-1202 OT Time Calculation (min): 42 min Charges:  OT General Charges $OT Visit: 1 Visit OT Evaluation $OT Eval Moderate Complexity: 1 Mod OT Treatments $Self Care/Home Management : 23-37 mins  Cleta Alberts, OTR/L Acute Rehabilitation Services Office: [phone]  Malka So 07/03/2022, 1:33 PM

## 2022-07-03 NOTE — Progress Notes (Signed)
Orthopaedic Trauma Progress Note  SUBJECTIVE: Doing okay this morning.  Notes her pain is controlled. No other complaints.  Dementia at baseline, unable to provide any additional information  OBJECTIVE:  Vitals:   07/02/22 2000 07/03/22 0431  BP: (!) 153/86 (!) 141/82  Pulse: 95 96  Resp: 18 18  Temp: 98.8 F (37.1 C)   SpO2: 96% 95%    General: Laying in bed comfortably, no acute distress Respiratory: No increased work of breathing.  Right lower extremity: Dressing clean, dry, intact.  Tenderness over the hip and throughout the lateral thigh as expected.  Tolerates passive ankle range of motion.  Wiggles toes.  Foot warm well perfused.  Compartment soft compressible.  IMAGING: Stable post op imaging.   LABS:  Results for orders placed or performed during the hospital encounter of 07/01/22 (from the past 24 hour(s))  Glucose, capillary     Status: Abnormal   Collection Time: 07/02/22  8:22 AM  Result Value Ref Range   Glucose-Capillary 123 (H) 70 - 99 mg/dL  Surgical pcr screen     Status: None   Collection Time: 07/02/22 12:07 PM   Specimen: Nasal Mucosa; Nasal Swab  Result Value Ref Range   MRSA, PCR NEGATIVE NEGATIVE   Staphylococcus aureus NEGATIVE NEGATIVE  Glucose, capillary     Status: Abnormal   Collection Time: 07/02/22 12:28 PM  Result Value Ref Range   Glucose-Capillary 101 (H) 70 - 99 mg/dL  CBC     Status: Abnormal   Collection Time: 07/02/22  3:42 PM  Result Value Ref Range   WBC 8.4 4.0 - 10.5 K/uL   RBC 3.73 (L) 3.87 - 5.11 MIL/uL   Hemoglobin 10.7 (L) 12.0 - 15.0 g/dL   HCT 34.9 (L) 36.0 - 46.0 %   MCV 93.6 80.0 - 100.0 fL   MCH 28.7 26.0 - 34.0 pg   MCHC 30.7 30.0 - 36.0 g/dL   RDW 13.2 11.5 - 15.5 %   Platelets 114 (L) 150 - 400 K/uL   nRBC 0.0 0.0 - 0.2 %  Creatinine, serum     Status: None   Collection Time: 07/02/22  3:42 PM  Result Value Ref Range   Creatinine, Ser 0.88 0.44 - 1.00 mg/dL   GFR, Estimated >60 >60 mL/min  Glucose, capillary      Status: Abnormal   Collection Time: 07/02/22  5:03 PM  Result Value Ref Range   Glucose-Capillary 161 (H) 70 - 99 mg/dL  Glucose, capillary     Status: Abnormal   Collection Time: 07/02/22  8:35 PM  Result Value Ref Range   Glucose-Capillary 149 (H) 70 - 99 mg/dL  CBC     Status: Abnormal   Collection Time: 07/03/22  4:29 AM  Result Value Ref Range   WBC 9.5 4.0 - 10.5 K/uL   RBC 3.69 (L) 3.87 - 5.11 MIL/uL   Hemoglobin 10.7 (L) 12.0 - 15.0 g/dL   HCT 34.1 (L) 36.0 - 46.0 %   MCV 92.4 80.0 - 100.0 fL   MCH 29.0 26.0 - 34.0 pg   MCHC 31.4 30.0 - 36.0 g/dL   RDW 13.2 11.5 - 15.5 %   Platelets 122 (L) 150 - 400 K/uL   nRBC 0.0 0.0 - 0.2 %  Basic metabolic panel     Status: Abnormal   Collection Time: 07/03/22  4:29 AM  Result Value Ref Range   Sodium 142 135 - 145 mmol/L   Potassium 3.7 3.5 - 5.1 mmol/L  Chloride 109 98 - 111 mmol/L   CO2 23 22 - 32 mmol/L   Glucose, Bld 132 (H) 70 - 99 mg/dL   BUN 16 8 - 23 mg/dL   Creatinine, Ser 0.75 0.44 - 1.00 mg/dL   Calcium 9.3 8.9 - 10.3 mg/dL   GFR, Estimated >60 >60 mL/min   Anion gap 10 5 - 15    ASSESSMENT: [name] is a 71 y.o. female, 1 Day Post-Op s/p INTRAMEDULLARY NAIL RIGHT INTERTROCHANTERIC FEMUR FRACTURE  CV/Blood loss: Hemoglobin 10.7 this morning, stable from preop.  Hemodynamically stable  PLAN: Weightbearing: WBAT RLE ROM: Unrestricted ROM Incisional and dressing care: Reinforce dressings as needed  Showering: Okay to begin showering/getting incisions wet 07/05/2022 Orthopedic device(s): None  Pain management:  1. Tylenol 325-650 mg q 6 hours PRN 2. Robaxin 500 mg q 6 hours PRN 3. Norco 5-325 mg OR 7.5-325 mg q 4 hours PRN 4. Morphine 0.5-1 mg q 2 hours PRN VTE prophylaxis: Lovenox, SCDs ID:  Ancef 2gm post op Foley/Lines:  No foley, KVO IVFs Impediments to Fracture Healing: Vitamin D level pending, will start supplementation as indicated Dispo: PT/OT evaluation today, will require SNF.  Plan to  change dressing RLE tomorrow 07/04/2022   D/C recommendations: I have signed and placed rx in chart -Norco 5-325 mg for pain control -Aspirin 325 mg daily x30 days for DVT prophylaxis -Possible need for Vit D supplementation  Follow - up plan: 2 weeks   Contact information:  Katha Hamming MD, Rushie Nyhan PA-C. After hours and holidays please check [email or web address] for group call information for [facility]   Gwinda Passe, PA-C [phone] (office) [email or web address]

## 2022-07-04 DIAGNOSIS — K219 Gastro-esophageal reflux disease without esophagitis: Secondary | ICD-10-CM

## 2022-07-04 DIAGNOSIS — F0390 Unspecified dementia without behavioral disturbance: Secondary | ICD-10-CM | POA: Diagnosis not present

## 2022-07-04 DIAGNOSIS — S72001A Fracture of unspecified part of neck of right femur, initial encounter for closed fracture: Secondary | ICD-10-CM | POA: Diagnosis not present

## 2022-07-04 LAB — CBC
HCT: 33.2 % — ABNORMAL LOW (ref 36.0–46.0)
Hemoglobin: 11.1 g/dL — ABNORMAL LOW (ref 12.0–15.0)
MCH: 29.8 pg (ref 26.0–34.0)
MCHC: 33.4 g/dL (ref 30.0–36.0)
MCV: 89 fL (ref 80.0–100.0)
Platelets: 132 10*3/uL — ABNORMAL LOW (ref 150–400)
RBC: 3.73 MIL/uL — ABNORMAL LOW (ref 3.87–5.11)
RDW: 12.8 % (ref 11.5–15.5)
WBC: 7.4 10*3/uL (ref 4.0–10.5)
nRBC: 0 % (ref 0.0–0.2)

## 2022-07-04 LAB — GLUCOSE, CAPILLARY
Glucose-Capillary: 119 mg/dL — ABNORMAL HIGH (ref 70–99)
Glucose-Capillary: 125 mg/dL — ABNORMAL HIGH (ref 70–99)
Glucose-Capillary: 100 mg/dL — ABNORMAL HIGH (ref 70–99)
Glucose-Capillary: 132 mg/dL — ABNORMAL HIGH (ref 70–99)

## 2022-07-04 MED ORDER — HYDROCODONE-ACETAMINOPHEN 5-325 MG PO TABS
1.0000 | ORAL_TABLET | ORAL | Status: DC | PRN
  Administered 2022-07-13 – 2022-07-14 (×2): 2 via ORAL
  Filled 2022-07-04 (×2): qty 2

## 2022-07-04 NOTE — NC FL2 (Signed)
[place] LEVEL OF CARE SCREENING TOOL     IDENTIFICATION  Patient Name: [name] Birthdate: 01/08/1951 Sex: female Admission Date (Current Location): 07/01/2022  [facility] and [place] Number:  Herbalist and Address:  [facility], [place] [address], [place], [place] [address]      Provider Number: [id number]  Attending Physician Name and Address:  Charlynne Cousins, MD  Relative Name and Phone Number:  Kieth Brightly   [phone]    Current Level of Care: Hospital Recommended Level of Care: [place] Prior Approval Number:    Date Approved/Denied:   PASRR Number: [id number]  Discharge Plan: SNF    Current Diagnoses: Patient Active Problem List   Diagnosis Date Noted   Dementia without behavioral disturbance ([place]) 07/01/2022   Closed right hip fracture ([place]) 07/01/2022   Acute cystitis without hematuria    Acute metabolic encephalopathy 68/07/7516   Nausea and vomiting 09/12/2011   Hypercalcemia 09/12/2011   UTI (lower urinary tract infection) 09/12/2011   Anastomotic leak of biliary tree 08/26/2011   Diabetes mellitus ([place]) 08/21/2011   Hypokalemia 08/21/2011   Tachycardia 08/21/2011   Biliary sludge 08/21/2011   Hyperlipidemia    GERD (gastroesophageal reflux disease)    Hypertension     Orientation RESPIRATION BLADDER Height & Weight      (disoriented x4)  Normal Continent, External catheter Weight: 112 lb 3.4 oz (50.9 kg) Height:  5' 2.01" (157.5 cm)  BEHAVIORAL SYMPTOMS/MOOD NEUROLOGICAL BOWEL NUTRITION STATUS      Continent Diet (see discharge summary)  AMBULATORY STATUS COMMUNICATION OF NEEDS Skin   Total Care Verbally Surgical wounds                       Personal Care Assistance Level of Assistance  Bathing, Feeding, Dressing, Total care Bathing Assistance: Maximum assistance Feeding assistance: Maximum assistance Dressing Assistance: Maximum assistance Total Care  Assistance: Maximum assistance   Functional Limitations Info  Sight, Hearing, Speech Sight Info: Adequate Hearing Info: Impaired Speech Info: Adequate    SPECIAL CARE FACTORS FREQUENCY  PT (By licensed PT), OT (By licensed OT)     PT Frequency: 5x week OT Frequency: 5x week            Contractures Contractures Info: Not present    Additional Factors Info  Code Status, Allergies Code Status Info: full Allergies Info: Flomax (Tamsulosin Hcl), Sulfa Antibiotics           Current Medications (07/04/2022):  This is the current hospital active medication list Current Facility-Administered Medications  Medication Dose Route Frequency Provider Last Rate Last Admin   0.9 %  sodium chloride infusion   Intravenous Continuous Charlynne Cousins, MD 10 mL/hr at 07/03/22 0942 Rate Change at 07/03/22 0942   acetaminophen (TYLENOL) tablet 325-650 mg  325-650 mg Oral Q6H PRN Corinne Ports, PA-C       atorvastatin (LIPITOR) tablet 40 mg  40 mg Oral QHS Rushie Nyhan A, PA-C   40 mg at 07/03/22 2236   citalopram (CELEXA) tablet 10 mg  10 mg Oral Daily Rushie Nyhan A, PA-C   10 mg at 07/03/22 0930   divalproex (DEPAKOTE) DR tablet 125 mg  125 mg Oral 2 times per day Aileen Fass, Tammi Klippel, MD   125 mg at 07/03/22 1430   divalproex (DEPAKOTE) DR tablet 250 mg  250 mg Oral Q24H Charlynne Cousins, MD   250 mg at 07/03/22 1816  docusate sodium (COLACE) capsule 100 mg  100 mg Oral BID Corinne Ports, PA-C   100 mg at 07/03/22 2236   enoxaparin (LOVENOX) injection 40 mg  40 mg Subcutaneous Q24H Corinne Ports, PA-C   40 mg at 07/03/22 8270   famotidine (PEPCID) tablet 10 mg  10 mg Oral BID Corinne Ports, PA-C   10 mg at 07/03/22 2236   HYDROcodone-acetaminophen (NORCO) 7.5-325 MG per tablet 1-2 tablet  1-2 tablet Oral Q4H PRN Corinne Ports, PA-C       HYDROcodone-acetaminophen (NORCO/VICODIN) 5-325 MG per tablet 1-2 tablet  1-2 tablet Oral Q4H PRN Corinne Ports, PA-C        insulin aspart (novoLOG) injection 0-15 Units  0-15 Units Subcutaneous TID WC Corinne Ports, PA-C   3 Units at 07/02/22 1714   insulin aspart (novoLOG) injection 0-5 Units  0-5 Units Subcutaneous QHS McClung, Sarah A, PA-C       methocarbamol (ROBAXIN) tablet 500 mg  500 mg Oral Q6H PRN Corinne Ports, PA-C       Or   methocarbamol (ROBAXIN) 500 mg in dextrose 5 % 50 mL IVPB  500 mg Intravenous Q6H PRN McClung, Sarah A, PA-C       metoCLOPramide (REGLAN) tablet 5-10 mg  5-10 mg Oral Q8H PRN Thereasa Solo, Sarah A, PA-C       Or   metoCLOPramide (REGLAN) injection 5-10 mg  5-10 mg Intravenous Q8H PRN McClung, Sarah A, PA-C       mirtazapine (REMERON) tablet 30 mg  30 mg Oral QHS Rushie Nyhan A, PA-C   30 mg at 07/03/22 2236   montelukast (SINGULAIR) tablet 10 mg  10 mg Oral QHS Corinne Ports, PA-C   10 mg at 07/03/22 2236   morphine (PF) 2 MG/ML injection 0.5-1 mg  0.5-1 mg Intravenous Q2H PRN Corinne Ports, PA-C   1 mg at 07/03/22 0023   ondansetron (ZOFRAN) injection 4 mg  4 mg Intravenous Q6H PRN Corinne Ports, PA-C       ondansetron (ZOFRAN) tablet 4 mg  4 mg Oral Q6H PRN Corinne Ports, PA-C       Or   ondansetron (ZOFRAN) injection 4 mg  4 mg Intravenous Q6H PRN McClung, Sarah A, PA-C       polyethylene glycol (MIRALAX / GLYCOLAX) packet 17 g  17 g Oral BID Corinne Ports, PA-C   17 g at 07/03/22 2236   polyethylene glycol (MIRALAX / GLYCOLAX) packet 17 g  17 g Oral Daily PRN Corinne Ports, PA-C         Discharge Medications: Please see discharge summary for a list of discharge medications.  Relevant Imaging Results:  Relevant Lab Results:   Additional Information Pt SSN: [id number].  Pt is from [facility] memory care, and can return at [place]. Pt is vaccinated for covid with multiple boosters.  Joanne Chars, LCSW

## 2022-07-04 NOTE — TOC Progression Note (Signed)
Transition of Care [facility]) - Progression Note    Patient Details  Name: [name] MRN: [id number] Date of Birth: [birth year]  Transition of [facility] CM/SW Contact  Joanne Chars, LCSW Phone Number: 07/04/2022, 2:00 PM  Clinical Narrative:    CSW spoke with sister [name] and her husband Patrick Jupiter about bed offers: neither [facility] nor [facility] offered bed.  They accept offer at [facility]  [facility] informed.  Auth submitted in [place].     Expected Discharge Plan: [place] Barriers to Discharge: Continued Medical Work up, SNF Pending bed offer  Expected Discharge Plan and Services Expected Discharge Plan: [place] In-house Referral: Clinical Social Work   Post Acute Care Choice: [place] arrangements for the past 2 months: [place] ([facility] -memory care) Expected Discharge Date: 07/04/22                                     Social Determinants of Health (SDOH) Interventions    Readmission Risk Interventions     No data to display

## 2022-07-04 NOTE — Discharge Summary (Addendum)
Physician Discharge Summary  [name] YIF:[id number] DOB: [birth year] DOA: 07/01/2022  PCP: Caryl Bis, MD  Admit date: 07/01/2022 Discharge date: 07/04/2022  Admitted From: SNF Disposition:  SNF  Recommendations for Outpatient Follow-up:  Follow up with PCP in 1-2 weeks Please obtain BMP/CBC in one week   Home Health:No Equipment/Devices:None  Discharge Condition:Stable CODE STATUS:Full Diet recommendation: Heart Healthy  Brief/Interim Summary:  71 y.o. female past medical history significant for severe dementia, diabetes mellitus type 2 hyperlipidemia resides at a memory care unit facility had a fall started complaining of right hip pain, x-ray and CT of her hip show impacted right femoral neck fracture, with anterior relation orthopedic surgery was consulted, orthopedic surgery to see the patient for possible surgery Sunday or Monday. Patient cannot provide any history.   Discharge Diagnoses:  Principal Problem:   Closed right hip fracture (HCC) Active Problems:   Hyperlipidemia   GERD (gastroesophageal reflux disease)   Hypertension   Diabetes mellitus ([place])   Dementia without behavioral disturbance (HCC)  Closed right hip fracture With bleeding surgery was consulted recommended intramedullary nailing of the right intertrochanteric femur. She was continued on Norco and aspirin for DVT prophylaxis. Physical therapy evaluated the patient, she will need to go to skilled nursing facility.  Diabetes mellitus type 2 with hyperglycemia: On no oral hypoglycemic agents at home her A1c is 5.5 blood glucose remain relatively stable in house.  Acute kidney injury: Likely prerenal azotemia resolved with IV fluid resuscitation.  Hyperlipidemia: Continue statins.  GERD: Continue PPI.  Elevated blood pressure without a diagnosis of hypertension: Likely due to pain once her pain was controlled her blood pressure improved.  Advanced dementia without behavioral  disturbances; noted.  Discharge Instructions  Discharge Instructions     Diet - low sodium heart healthy   Complete by: As directed    Increase activity slowly   Complete by: As directed    No wound care   Complete by: As directed       Allergies as of 07/04/2022       Reactions   Flomax [tamsulosin Hcl] Swelling   JAWS   Sulfa Antibiotics Swelling        Medication List     STOP taking these medications    cefdinir 300 MG capsule Commonly known as: OMNICEF       TAKE these medications    acetaminophen 325 MG tablet Commonly known as: TYLENOL Take 1-2 tablets (325-650 mg total) by mouth every 6 (six) hours as needed for mild pain or moderate pain.   aspirin EC 325 MG tablet Take 1 tablet (325 mg total) by mouth daily.   atorvastatin 20 MG tablet Commonly known as: LIPITOR Take 40 mg by mouth at bedtime.   cholecalciferol 25 MCG (1000 UNIT) tablet Commonly known as: VITAMIN D3 Take 2,000 Units by mouth daily.   cyanocobalamin 500 MCG tablet Commonly known as: VITAMIN B12 Take 500 mcg by mouth daily.   divalproex 125 MG DR tablet Commonly known as: DEPAKOTE Take 125-250 mg by mouth 3 (three) times daily. 1 tablet at 0800 and 1400, 2 tablets at bedtime (1900)   docusate sodium 100 MG capsule Commonly known as: COLACE Take 100 mg by mouth 2 (two) times daily.   famotidine 10 MG tablet Commonly known as: PEPCID Take 10 mg by mouth 2 (two) times daily.   fluticasone 50 MCG/ACT nasal spray Commonly known as: FLONASE Place 2 sprays into both nostrils daily.   HYDROcodone-acetaminophen 5-325 MG  tablet Commonly known as: NORCO/VICODIN Take 1 tablet by mouth every 6 (six) hours as needed for severe pain.   mirtazapine 30 MG tablet Commonly known as: REMERON Take 30 mg by mouth at bedtime.   montelukast 10 MG tablet Commonly known as: SINGULAIR Take 10 mg by mouth at bedtime.   ondansetron 4 MG disintegrating tablet Commonly known as:  ZOFRAN-ODT Take 4 mg by mouth every 8 (eight) hours as needed.        Follow-up Information     Haddix, Thomasene Lot, MD. Schedule an appointment as soon as possible for a visit in 2 week(s).   Specialty: Orthopedic Surgery Why: for wound check and repeat x-rays Contact information: [place] [address] [phone]                Allergies  Allergen Reactions   Flomax [Tamsulosin Hcl] Swelling    JAWS   Sulfa Antibiotics Swelling    Consultations: Orthopedic surgery Dr. Doreatha Martin   Procedures/Studies: DG FEMUR PORT, MIN 2 VIEWS RIGHT  Result Date: 07/02/2022 CLINICAL DATA:  Postop. EXAM: RIGHT FEMUR PORTABLE 2 VIEW COMPARISON:  Preoperative imaging. FINDINGS: Intramedullary nail with distal locking and trans trochanteric screw fixation of proximal femur fracture. Recent postsurgical change includes air and edema in the joint space and soft tissues. Distal femur is intact. IMPRESSION: ORIF of proximal femur fracture. No immediate postoperative complication. Electronically Signed   By: Keith Rake M.D.   On: 07/02/2022 19:50   DG FEMUR, MIN 2 VIEWS RIGHT  Result Date: 07/02/2022 CLINICAL DATA:  Elective surgery. EXAM: RIGHT FEMUR 2 VIEWS COMPARISON:  Preoperative imaging. FINDINGS: Five fluoroscopic spot views of the right hip obtained in the operating room. Intramedullary nail with distal locking and trans trochanteric screw fixation no proximal femur fracture. Fluoroscopy time 54.3 seconds. Dose 5.71 mGy. IMPRESSION: Intraoperative fluoroscopy during right femoral intramedullary nail and trans trochanteric screw fixation. Electronically Signed   By: Keith Rake M.D.   On: 07/02/2022 14:50   DG C-Arm 1-60 Min-No Report  Result Date: 07/02/2022 Fluoroscopy was utilized by the requesting physician.  No radiographic interpretation.   CT HIP RIGHT WO CONTRAST  Result Date: 07/01/2022 CLINICAL DATA:  Hip surgical planning. Femoral neck fracture on  radiograph EXAM: CT OF THE RIGHT HIP WITHOUT CONTRAST TECHNIQUE: Multidetector CT imaging of the right hip was performed according to the standard protocol. Multiplanar CT image reconstructions were also generated. RADIATION DOSE REDUCTION: This exam was performed according to the departmental dose-optimization program which includes automated exposure control, adjustment of the mA and/or kV according to patient size and/or use of iterative reconstruction technique. COMPARISON:  Radiograph earlier today. FINDINGS: Bones/Joint/Cartilage Impacted right basicervical femoral neck fracture with mild apex anterior angulation. There is mild displacement. Femoral head is seated in the acetabulum. Mild osteoarthritis of the right hip. Pubic rami and included hemipelvis are intact. Ligaments Suboptimally assessed by CT. Muscles and Tendons Mild edema/hemorrhage in the obturator musculature. Soft tissues Subcutaneous edema laterally.  There is no intrapelvic free fluid. IMPRESSION: Impacted right basicervical femoral neck fracture with mild apex anterior angulation and mild displacement. Electronically Signed   By: Keith Rake M.D.   On: 07/01/2022 18:48   DG Hip Unilat W or Wo Pelvis 2-3 Views Right  Result Date: 07/01/2022 CLINICAL DATA:  Right-sided hip and thigh pain after fall. EXAM: DG HIP (WITH OR WITHOUT PELVIS) 2-3V RIGHT COMPARISON:  None. FINDINGS: Although limited by positioning, there is an impacted right femoral neck fracture.  Mild rotation of the femur. The femoral head is seated. The bones are diffusely under mineralized. Mild bilateral hip degenerative change. Intact pubic rami. IMPRESSION: Impacted right femoral neck fracture. Electronically Signed   By: Keith Rake M.D.   On: 07/01/2022 15:45   CT Cervical Spine Wo Contrast  Result Date: 07/01/2022 CLINICAL DATA:  Neck trauma (Age >= 65y) EXAM: CT CERVICAL SPINE WITHOUT CONTRAST TECHNIQUE: Multidetector CT imaging of the cervical spine was  performed without intravenous contrast. Multiplanar CT image reconstructions were also generated. RADIATION DOSE REDUCTION: This exam was performed according to the departmental dose-optimization program which includes automated exposure control, adjustment of the mA and/or kV according to patient size and/or use of iterative reconstruction technique. COMPARISON:  Cervical spine CT 05/14/2022 FINDINGS: Alignment: No traumatic subluxation. Similar reversal of normal lordosis. Skull base and vertebrae: No acute fracture. Vertebral body heights are maintained. The dens and skull base are intact. Soft tissues and spinal canal: No prevertebral fluid or swelling. No visible canal hematoma. Disc levels: Degenerative disc disease from C3-C4 through C6-C7, stable. Upper chest: No acute or unexpected findings. Other: None. IMPRESSION: Stable degenerative change in the cervical spine without acute fracture or subluxation. Electronically Signed   By: Keith Rake M.D.   On: 07/01/2022 15:41   CT Head Wo Contrast  Result Date: 07/01/2022 CLINICAL DATA:  Post fall. EXAM: CT HEAD WITHOUT CONTRAST TECHNIQUE: Contiguous axial images were obtained from the base of the skull through the vertex without intravenous contrast. RADIATION DOSE REDUCTION: This exam was performed according to the departmental dose-optimization program which includes automated exposure control, adjustment of the mA and/or kV according to patient size and/or use of iterative reconstruction technique. COMPARISON:  05/14/2022 FINDINGS: Brain: No acute intracranial hemorrhage. No subdural or extra-axial collection. Stable degree of atrophy and chronic small vessel ischemia. No evidence of acute infarct. No midline shift, mass effect or hydrocephalus. Vascular: Atherosclerosis of skullbase vasculature without hyperdense vessel or abnormal calcification. Skull: No fracture or focal lesion. Sinuses/Orbits: Paranasal sinuses and mastoid air cells are clear.  The visualized orbits are unremarkable. Other: No confluent scalp hematoma. IMPRESSION: 1. No acute intracranial abnormality. No skull fracture. 2. Stable atrophy and chronic small vessel ischemia. Electronically Signed   By: Keith Rake M.D.   On: 07/01/2022 15:38   (Echo, Carotid, EGD, Colonoscopy, ERCP)    Subjective: No complaints  Discharge Exam: Vitals:   07/04/22 0823 07/04/22 1218  BP: (!) 141/78 110/78  Pulse: (!) 107 100  Resp:  17  Temp: 98.4 F (36.9 C) 99.9 F (37.7 C)  SpO2: 99% 96%   Vitals:   07/03/22 1453 07/03/22 1949 07/04/22 0823 07/04/22 1218  BP: (!) 141/82 106/83 (!) 141/78 110/78  Pulse: 97 (!) 102 (!) 107 100  Resp:  15  17  Temp: 98.5 F (36.9 C) 97.9 F (36.6 C) 98.4 F (36.9 C) 99.9 F (37.7 C)  TempSrc: Oral  Oral   SpO2: 97% (!) 82% 99% 96%  Weight:      Height:        General: Pt is alert, awake, not in acute distress Cardiovascular: RRR, S1/S2 +, no rubs, no gallops Respiratory: CTA bilaterally, no wheezing, no rhonchi Abdominal: Soft, NT, ND, bowel sounds + Extremities: no edema, no cyanosis    The results of significant diagnostics from this hospitalization (including imaging, microbiology, ancillary and laboratory) are listed below for reference.     Microbiology: Recent Results (from the past 240 hour(s))  Surgical pcr screen  Status: None   Collection Time: 07/02/22  2:07 AM  Result Value Ref Range Status   MRSA, PCR NEGATIVE NEGATIVE Final   Staphylococcus aureus NEGATIVE NEGATIVE Final    Comment: (NOTE) The Xpert SA Assay (FDA approved for NASAL specimens in patients 66 years of age and older), is one component of a comprehensive surveillance program. It is not intended to diagnose infection nor to guide or monitor treatment. Performed at [facility], [place] [address], [place], [place] [address]   Surgical pcr screen     Status: None   Collection Time: 07/02/22 12:07 PM   Specimen: Nasal Mucosa; Nasal  Swab  Result Value Ref Range Status   MRSA, PCR NEGATIVE NEGATIVE Final   Staphylococcus aureus NEGATIVE NEGATIVE Final    Comment: (NOTE) The Xpert SA Assay (FDA approved for NASAL specimens in patients 47 years of age and older), is one component of a comprehensive surveillance program. It is not intended to diagnose infection nor to guide or monitor treatment. Performed at [facility], [place] [address], [place], [place] [address]      Labs: BNP (last 3 results) No results for input(s): "BNP" in the last 8760 hours. Basic Metabolic Panel: Recent Labs  Lab 07/01/22 1545 07/02/22 1542 07/03/22 0429  NA 142  --  142  K 4.0  --  3.7  CL 109  --  109  CO2 25  --  23  GLUCOSE 172*  --  132*  BUN 26*  --  16  CREATININE 1.16* 0.88 0.75  CALCIUM 9.3  --  9.3   Liver Function Tests: No results for input(s): "AST", "ALT", "ALKPHOS", "BILITOT", "PROT", "ALBUMIN" in the last 168 hours. No results for input(s): "LIPASE", "AMYLASE" in the last 168 hours. No results for input(s): "AMMONIA" in the last 168 hours. CBC: Recent Labs  Lab 07/01/22 1545 07/02/22 1542 07/03/22 0429 07/04/22 0427  WBC 9.1 8.4 9.5 7.4  NEUTROABS 7.3  --   --   --   HGB 11.2* 10.7* 10.7* 11.1*  HCT 36.6 34.9* 34.1* 33.2*  MCV 94.1 93.6 92.4 89.0  PLT 144* 114* 122* 132*   Cardiac Enzymes: No results for input(s): "CKTOTAL", "CKMB", "CKMBINDEX", "TROPONINI" in the last 168 hours. BNP: Invalid input(s): "POCBNP" CBG: Recent Labs  Lab 07/03/22 1127 07/03/22 1608 07/03/22 1947 07/04/22 0827 07/04/22 1138  GLUCAP 111* 108* 115* 119* 125*   D-Dimer No results for input(s): "DDIMER" in the last 72 hours. Hgb A1c No results for input(s): "HGBA1C" in the last 72 hours.  Lipid Profile No results for input(s): "CHOL", "HDL", "LDLCALC", "TRIG", "CHOLHDL", "LDLDIRECT" in the last 72 hours. Thyroid function studies No results for input(s): "TSH", "T4TOTAL", "T3FREE", "THYROIDAB" in the last 72  hours.  Invalid input(s): "FREET3" Anemia work up No results for input(s): "VITAMINB12", "FOLATE", "FERRITIN", "TIBC", "IRON", "RETICCTPCT" in the last 72 hours. Urinalysis    Component Value Date/Time   COLORURINE YELLOW 07/01/2022 2121   APPEARANCEUR TURBID (A) 07/01/2022 2121   LABSPEC 1.015 07/01/2022 2121   PHURINE 6.0 07/01/2022 2121   GLUCOSEU NEGATIVE 07/01/2022 2121   HGBUR SMALL (A) 07/01/2022 2121   BILIRUBINUR NEGATIVE 07/01/2022 2121   KETONESUR 20 (A) 07/01/2022 2121   PROTEINUR >=300 (A) 07/01/2022 2121   UROBILINOGEN 1.0 09/12/2011 0049   NITRITE POSITIVE (A) 07/01/2022 2121   LEUKOCYTESUR LARGE (A) 07/01/2022 2121   Sepsis Labs Recent Labs  Lab 07/01/22 1545 07/02/22 1542 07/03/22 0429 07/04/22 0427  WBC 9.1  8.4 9.5 7.4   Microbiology Recent Results (from the past 240 hour(s))  Surgical pcr screen     Status: None   Collection Time: 07/02/22  2:07 AM  Result Value Ref Range Status   MRSA, PCR NEGATIVE NEGATIVE Final   Staphylococcus aureus NEGATIVE NEGATIVE Final    Comment: (NOTE) The Xpert SA Assay (FDA approved for NASAL specimens in patients 31 years of age and older), is one component of a comprehensive surveillance program. It is not intended to diagnose infection nor to guide or monitor treatment. Performed at [facility], [place] [address], [place], [place] [address]   Surgical pcr screen     Status: None   Collection Time: 07/02/22 12:07 PM   Specimen: Nasal Mucosa; Nasal Swab  Result Value Ref Range Status   MRSA, PCR NEGATIVE NEGATIVE Final   Staphylococcus aureus NEGATIVE NEGATIVE Final    Comment: (NOTE) The Xpert SA Assay (FDA approved for NASAL specimens in patients 31 years of age and older), is one component of a comprehensive surveillance program. It is not intended to diagnose infection nor to guide or monitor treatment. Performed at [facility], [place] [address] [place], [place] [address]     SIGNED:   Charlynne Cousins, MD  [facility] 07/04/2022, 3:43 PM Pager   If 7PM-7AM, please contact night-coverage [email or web address] Password [email or web address]

## 2022-07-04 NOTE — Plan of Care (Signed)

## 2022-07-04 NOTE — Progress Notes (Signed)
Orthopaedic Trauma Progress Note  SUBJECTIVE: Doing okay this morning.  Notes her pain is controlled. No other complaints.  Dementia at baseline, unable to provide any additional information  OBJECTIVE:  Vitals:   07/03/22 1949 07/04/22 0823  BP: 106/83 (!) 141/78  Pulse: (!) 102 (!) 107  Resp: 15   Temp: 97.9 F (36.6 C) 98.4 F (36.9 C)  SpO2: (!) 82% 99%    General: Laying in bed comfortably, no acute distress Respiratory: No increased work of breathing.  Right lower extremity: Dressing clean, dry, intact.  Tenderness over the hip and throughout the lateral thigh as expected.  Tolerates passive ankle range of motion.  Wiggles toes.  Foot warm well perfused.  Compartment soft compressible.  IMAGING: Stable post op imaging.   LABS:  Results for orders placed or performed during the hospital encounter of 07/01/22 (from the past 24 hour(s))  Glucose, capillary     Status: Abnormal   Collection Time: 07/03/22  4:08 PM  Result Value Ref Range   Glucose-Capillary 108 (H) 70 - 99 mg/dL  Glucose, capillary     Status: Abnormal   Collection Time: 07/03/22  7:47 PM  Result Value Ref Range   Glucose-Capillary 115 (H) 70 - 99 mg/dL  CBC     Status: Abnormal   Collection Time: 07/04/22  4:27 AM  Result Value Ref Range   WBC 7.4 4.0 - 10.5 K/uL   RBC 3.73 (L) 3.87 - 5.11 MIL/uL   Hemoglobin 11.1 (L) 12.0 - 15.0 g/dL   HCT 33.2 (L) 36.0 - 46.0 %   MCV 89.0 80.0 - 100.0 fL   MCH 29.8 26.0 - 34.0 pg   MCHC 33.4 30.0 - 36.0 g/dL   RDW 12.8 11.5 - 15.5 %   Platelets 132 (L) 150 - 400 K/uL   nRBC 0.0 0.0 - 0.2 %  Glucose, capillary     Status: Abnormal   Collection Time: 07/04/22  8:27 AM  Result Value Ref Range   Glucose-Capillary 119 (H) 70 - 99 mg/dL    ASSESSMENT: [name] is a 72 y.o. female, 2 Days Post-Op s/p INTRAMEDULLARY NAIL RIGHT INTERTROCHANTERIC FEMUR FRACTURE  CV/Blood loss: Hemoglobin 11.1 this morning, stable from preop.  Hemodynamically  stable  PLAN: Weightbearing: WBAT RLE ROM: Unrestricted ROM Incisional and dressing care: Ok to leave incisions open to air Showering: Okay to begin showering/getting incisions wet 07/05/2022 Orthopedic device(s): None  Pain management:  1. Tylenol 325-650 mg q 6 hours PRN 2. Robaxin 500 mg q 6 hours PRN 3. Norco 5-325 mg OR 7.5-325 mg q 4 hours PRN 4. Morphine 0.5-1 mg q 2 hours PRN VTE prophylaxis: Lovenox, SCDs ID:  Ancef 2gm post op completed Foley/Lines:  No foley, KVO IVFs Impediments to Fracture Healing: Vitamin D level 49, no supplementation indicated Dispo: Therapies as tolerated, PT/OT recommending SNF. OK for d/c from ortho standpoint  D/C recommendations: I have signed and placed rx in chart - Norco 5-325 mg for pain control - Aspirin 325 mg daily x30 days for DVT prophylaxis - No need for Vit D supplementation  Follow - up plan: 2 weeks after d/c for wound check and repeat x-rays   Contact information:  Katha Hamming MD, Rushie Nyhan PA-C. After hours and holidays please check [email or web address] for group call information for [facility]   Gwinda Passe, PA-C [phone] (office) [email or web address]

## 2022-07-04 NOTE — TOC Initial Note (Signed)
Transition of Care [facility]) - Initial/Assessment Note    Patient Details  Name: [name] MRN: [id number] Date of Birth: Sep 14, 1950  Transition of [facility]) CM/SW Contact:    Angela Chars, LCSW Phone Number: 07/04/2022, 8:55 AM  Clinical Narrative:  Pt with dementia, disoriented x4.  CSW spoke with pt [name] by phone, who reports she is POA.  Pt is in memory care at [facility] with plan for return.    Pt [name] also lives at [place].  [name] is agreeable to SNF, preferences would be [facility] or [facility]  Pt is vaccinated for covid with multiple boosters.                 Expected Discharge Plan: Skilled Nursing Facility Barriers to Discharge: Continued Medical Work up, SNF Pending bed offer   Patient Goals and CMS Choice Patient states their goals for this hospitalization and ongoing recovery are:: To return to memory care   Choice offered to / list presented to : Sibling ([name], who reports she is POA)  Expected Discharge Plan and Services Expected Discharge Plan: [place] In-house Referral: Clinical Social Work   Post Acute Care Choice: Early Living arrangements for the past 2 months: [place] ([place]-memory care) Expected Discharge Date: 07/04/22                                    Prior Living Arrangements/Services Living arrangements for the past 2 months: [place] [employer] [place]-memory care) Lives with:: Facility Resident Patient language and need for interpreter reviewed:: Yes        Need for Family Participation in Patient Care: Yes (Comment) Care giver support system in place?: Yes (comment) Current home services: Other (comment) (na) Criminal Activity/Legal Involvement Pertinent to Current Situation/Hospitalization: No - Comment as needed  Activities of Daily Living      Permission Sought/Granted                   Emotional Assessment Appearance:: Appears stated age Attitude/Demeanor/Rapport: Unable to Assess Affect (typically observed): Unable to Assess Orientation: :  (disoriented x4) Alcohol / Substance Use: Not Applicable Psych Involvement: No (comment)  Admission diagnosis:  Closed right hip fracture ([place]) [S72.001A] Closed fracture of neck of right femur, initial encounter ([place]) [S72.001A] Fall, initial encounter [W19.XXXA] Patient Active Problem List   Diagnosis Date Noted   Dementia without behavioral disturbance ([place]) 07/01/2022   Closed right hip fracture ([place]) 07/01/2022   Acute cystitis without hematuria    Acute metabolic encephalopathy 40/81/4481   Nausea and vomiting 09/12/2011   Hypercalcemia 09/12/2011   UTI (lower urinary tract infection) 09/12/2011   Anastomotic leak of biliary tree 08/26/2011   Diabetes mellitus ([place]) 08/21/2011   Hypokalemia 08/21/2011   Tachycardia 08/21/2011   Biliary sludge 08/21/2011   Hyperlipidemia    GERD (gastroesophageal reflux disease)    Hypertension    PCP:  Caryl Bis, MD Pharmacy:   [place], [place] - [address] [place] [address] Phone: [phone] Fax: [phone]     Social Determinants of Health (SDOH) Interventions    Readmission Risk Interventions     No data to display

## 2022-07-05 LAB — GLUCOSE, CAPILLARY
Glucose-Capillary: 140 mg/dL — ABNORMAL HIGH (ref 70–99)
Glucose-Capillary: 114 mg/dL — ABNORMAL HIGH (ref 70–99)
Glucose-Capillary: 130 mg/dL — ABNORMAL HIGH (ref 70–99)
Glucose-Capillary: 100 mg/dL — ABNORMAL HIGH (ref 70–99)

## 2022-07-05 NOTE — Care Management Important Message (Signed)
Important Message  Patient Details  Name: [name] MRN: [id number] Date of Birth: 01/01/1951   Medicare Important Message Given:  Yes     Hannah Beat 07/05/2022, 8:06 AM

## 2022-07-05 NOTE — Progress Notes (Signed)
Physical Therapy Treatment Patient Details Name: [name] MRN: [id number] DOB: [birth year] Today's Date: 07/05/2022   History of Present Illness 71 yo female with onset of a fall in her memory unit was admitted on 11/4, had IM nailing for R intertrochanteric fracture with WBAT.  PMHx:  DM with hyperglycemia, HLD, GERD, HTN,    PT Comments    Pt was seen for mobility with pt actively strongly resisting to be moved off her bed.  Worked with her to move legs, but noted her R hip is nearly impossible to flex or move from LLE to stretch/strengthen to increase control of hip.  Will anticipate her dc to SNF today, and if not will continue to work on being up and to get OOB with a second person.  Follow acutely for goal of PT POC.   Recommendations for follow up therapy are one component of a multi-disciplinary discharge planning process, led by the attending physician.  Recommendations may be updated based on patient status, additional functional criteria and insurance authorization.  Follow Up Recommendations  Skilled nursing-short term rehab (<3 hours/day) Can patient physically be transported by private vehicle: No   Assistance Recommended at Discharge Frequent or constant Supervision/Assistance  Patient can return home with the following Two people to help with walking and/or transfers;A lot of help with bathing/dressing/bathroom;Direct supervision/assist for medications management;Direct supervision/assist for financial management;Assist for transportation   Equipment Recommendations  Rolling walker (2 wheels)    Recommendations for Other Services       Precautions / Restrictions Precautions Precautions: Fall Precaution Comments: mitts, purwick, IV Restrictions Weight Bearing Restrictions: No RLE Weight Bearing: Weight bearing as tolerated     Mobility  Bed Mobility Overal bed mobility: Needs Assistance             General bed mobility comments: did not get OOB as pt is  fully resisting being moved    Transfers                        Ambulation/Gait                   Stairs             Wheelchair Mobility    Modified Rankin (Stroke Patients Only)       Balance                                            Cognition Arousal/Alertness: Awake/alert Behavior During Therapy: Flat affect, Anxious Overall Cognitive Status: History of cognitive impairments - at baseline                                 General Comments: from memory care        Exercises General Exercises - Lower Extremity Ankle Circles/Pumps: PROM, Both, 5 reps Gluteal Sets: PROM, 5 reps Heel Slides: PROM, 5 reps, Other (comment), Limitations (very little movement permitted by pt) Hip ABduction/ADduction: AAROM, Both, 10 reps    General Comments General comments (skin integrity, edema, etc.): Pt is on bed with mitt on L hand only, has IV in R hand and unwilling to move off the bed      Pertinent Vitals/Pain Pain Assessment Pain Assessment: Faces Faces Pain Scale: Hurts even more Pain Location: R hip Pain  Descriptors / Indicators: Guarding, Other (Comment) (actively resisting all RLE movement) Pain Intervention(s): Limited activity within patient's tolerance, Monitored during session, Repositioned    Home Living                          Prior Function            PT Goals (current goals can now be found in the care plan section) Progress towards PT goals: Not progressing toward goals - comment    Frequency    Min 3X/week      PT Plan Current plan remains appropriate    Co-evaluation              AM-PAC PT "6 Clicks" Mobility   Outcome Measure  Help needed turning from your back to your side while in a flat bed without using bedrails?: A Lot Help needed moving from lying on your back to sitting on the side of a flat bed without using bedrails?: A Lot Help needed moving to and from a  bed to a chair (including a wheelchair)?: Total Help needed standing up from a chair using your arms (e.g., wheelchair or bedside chair)?: Total Help needed to walk in hospital room?: Total Help needed climbing 3-5 steps with a railing? : Total 6 Click Score: 8    End of Session   Activity Tolerance: Patient limited by fatigue;Patient limited by pain Patient left: in bed;with call bell/phone within reach;with bed alarm set Nurse Communication: Mobility status PT Visit Diagnosis: Pain;Muscle weakness (generalized) (M62.81);History of falling (Z91.81);Difficulty in walking, not elsewhere classified (R26.2) Pain - Right/Left: Right Pain - part of body: Hip     Time: 3300-7622 PT Time Calculation (min) (ACUTE ONLY): 13 min  Charges:  $Therapeutic Exercise: 8-22 mins     Ramond Dial 07/05/2022, 3:23 PM  Mee Hives, PT PhD Acute Rehab Dept. Number: [place]

## 2022-07-05 NOTE — Discharge Summary (Signed)
Physician Discharge Summary  [name] EHM:[id number] DOB: Dec 27, 1950 DOA: 07/01/2022  PCP: Caryl Bis, MD  Admit date: 07/01/2022 Discharge date: 07/05/2022  Admitted From: SNF Disposition:  SNF  Recommendations for Outpatient Follow-up:  Follow up with PCP in 1-2 weeks Please obtain BMP/CBC in one week   Home Health:No Equipment/Devices:None  Discharge Condition:Stable CODE STATUS:Full Diet recommendation: Heart Healthy  Brief/Interim Summary:  71 y.o. female past medical history significant for severe dementia, diabetes mellitus type 2 hyperlipidemia resides at a memory care unit facility had a fall started complaining of right hip pain, x-ray and CT of her hip show impacted right femoral neck fracture, with anterior relation orthopedic surgery was consulted, orthopedic surgery to see the patient for possible surgery Sunday or Monday. Patient cannot provide any history.   Discharge Diagnoses:  Principal Problem:   Closed right hip fracture (HCC) Active Problems:   Hyperlipidemia   GERD (gastroesophageal reflux disease)   Hypertension   Diabetes mellitus ([place])   Dementia without behavioral disturbance (HCC)  Closed right hip fracture With bleeding surgery was consulted recommended intramedullary nailing of the right intertrochanteric femur. She was continued on Norco and aspirin for DVT prophylaxis. Physical therapy evaluated the patient, she will need to go to skilled nursing facility.  Diabetes mellitus type 2 with hyperglycemia: On no oral hypoglycemic agents at home her A1c is 5.5 blood glucose remain relatively stable in house.  Acute kidney injury: Likely prerenal azotemia resolved with IV fluid resuscitation.  Hyperlipidemia: Continue statins.  GERD: Continue PPI.  Elevated blood pressure without a diagnosis of hypertension: Likely due to pain once her pain was controlled her blood pressure improved.  Advanced dementia without behavioral  disturbances; noted.  Discharge Instructions  Discharge Instructions     Diet - low sodium heart healthy   Complete by: As directed    Increase activity slowly   Complete by: As directed    No wound care   Complete by: As directed       Allergies as of 07/05/2022       Reactions   Flomax [tamsulosin Hcl] Swelling   JAWS   Sulfa Antibiotics Swelling        Medication List     STOP taking these medications    cefdinir 300 MG capsule Commonly known as: OMNICEF       TAKE these medications    acetaminophen 325 MG tablet Commonly known as: TYLENOL Take 1-2 tablets (325-650 mg total) by mouth every 6 (six) hours as needed for mild pain or moderate pain.   aspirin EC 325 MG tablet Take 1 tablet (325 mg total) by mouth daily.   atorvastatin 20 MG tablet Commonly known as: LIPITOR Take 40 mg by mouth at bedtime.   cholecalciferol 25 MCG (1000 UNIT) tablet Commonly known as: VITAMIN D3 Take 2,000 Units by mouth daily.   cyanocobalamin 500 MCG tablet Commonly known as: VITAMIN B12 Take 500 mcg by mouth daily.   divalproex 125 MG DR tablet Commonly known as: DEPAKOTE Take 125-250 mg by mouth 3 (three) times daily. 1 tablet at 0800 and 1400, 2 tablets at bedtime (1900)   docusate sodium 100 MG capsule Commonly known as: COLACE Take 100 mg by mouth 2 (two) times daily.   famotidine 10 MG tablet Commonly known as: PEPCID Take 10 mg by mouth 2 (two) times daily.   fluticasone 50 MCG/ACT nasal spray Commonly known as: FLONASE Place 2 sprays into both nostrils daily.   HYDROcodone-acetaminophen 5-325 MG  tablet Commonly known as: NORCO/VICODIN Take 1 tablet by mouth every 6 (six) hours as needed for severe pain.   mirtazapine 30 MG tablet Commonly known as: REMERON Take 30 mg by mouth at bedtime.   montelukast 10 MG tablet Commonly known as: SINGULAIR Take 10 mg by mouth at bedtime.   ondansetron 4 MG disintegrating tablet Commonly known as:  ZOFRAN-ODT Take 4 mg by mouth every 8 (eight) hours as needed.        Follow-up Information     Haddix, Thomasene Lot, MD. Schedule an appointment as soon as possible for a visit in 2 week(s).   Specialty: Orthopedic Surgery Why: for wound check and repeat x-rays Contact information: [place] [address] [phone]                Allergies  Allergen Reactions   Flomax [Tamsulosin Hcl] Swelling    JAWS   Sulfa Antibiotics Swelling    Consultations: Orthopedic surgery Dr. Doreatha Martin   Procedures/Studies: DG FEMUR PORT, MIN 2 VIEWS RIGHT  Result Date: 07/02/2022 CLINICAL DATA:  Postop. EXAM: RIGHT FEMUR PORTABLE 2 VIEW COMPARISON:  Preoperative imaging. FINDINGS: Intramedullary nail with distal locking and trans trochanteric screw fixation of proximal femur fracture. Recent postsurgical change includes air and edema in the joint space and soft tissues. Distal femur is intact. IMPRESSION: ORIF of proximal femur fracture. No immediate postoperative complication. Electronically Signed   By: Keith Rake M.D.   On: 07/02/2022 19:50   DG FEMUR, MIN 2 VIEWS RIGHT  Result Date: 07/02/2022 CLINICAL DATA:  Elective surgery. EXAM: RIGHT FEMUR 2 VIEWS COMPARISON:  Preoperative imaging. FINDINGS: Five fluoroscopic spot views of the right hip obtained in the operating room. Intramedullary nail with distal locking and trans trochanteric screw fixation no proximal femur fracture. Fluoroscopy time 54.3 seconds. Dose 5.71 mGy. IMPRESSION: Intraoperative fluoroscopy during right femoral intramedullary nail and trans trochanteric screw fixation. Electronically Signed   By: Keith Rake M.D.   On: 07/02/2022 14:50   DG C-Arm 1-60 Min-No Report  Result Date: 07/02/2022 Fluoroscopy was utilized by the requesting physician.  No radiographic interpretation.   CT HIP RIGHT WO CONTRAST  Result Date: 07/01/2022 CLINICAL DATA:  Hip surgical planning. Femoral neck fracture on  radiograph EXAM: CT OF THE RIGHT HIP WITHOUT CONTRAST TECHNIQUE: Multidetector CT imaging of the right hip was performed according to the standard protocol. Multiplanar CT image reconstructions were also generated. RADIATION DOSE REDUCTION: This exam was performed according to the departmental dose-optimization program which includes automated exposure control, adjustment of the mA and/or kV according to patient size and/or use of iterative reconstruction technique. COMPARISON:  Radiograph earlier today. FINDINGS: Bones/Joint/Cartilage Impacted right basicervical femoral neck fracture with mild apex anterior angulation. There is mild displacement. Femoral head is seated in the acetabulum. Mild osteoarthritis of the right hip. Pubic rami and included hemipelvis are intact. Ligaments Suboptimally assessed by CT. Muscles and Tendons Mild edema/hemorrhage in the obturator musculature. Soft tissues Subcutaneous edema laterally.  There is no intrapelvic free fluid. IMPRESSION: Impacted right basicervical femoral neck fracture with mild apex anterior angulation and mild displacement. Electronically Signed   By: Keith Rake M.D.   On: 07/01/2022 18:48   DG Hip Unilat W or Wo Pelvis 2-3 Views Right  Result Date: 07/01/2022 CLINICAL DATA:  Right-sided hip and thigh pain after fall. EXAM: DG HIP (WITH OR WITHOUT PELVIS) 2-3V RIGHT COMPARISON:  None. FINDINGS: Although limited by positioning, there is an impacted right femoral neck fracture.  Mild rotation of the femur. The femoral head is seated. The bones are diffusely under mineralized. Mild bilateral hip degenerative change. Intact pubic rami. IMPRESSION: Impacted right femoral neck fracture. Electronically Signed   By: Keith Rake M.D.   On: 07/01/2022 15:45   CT Cervical Spine Wo Contrast  Result Date: 07/01/2022 CLINICAL DATA:  Neck trauma (Age >= 65y) EXAM: CT CERVICAL SPINE WITHOUT CONTRAST TECHNIQUE: Multidetector CT imaging of the cervical spine was  performed without intravenous contrast. Multiplanar CT image reconstructions were also generated. RADIATION DOSE REDUCTION: This exam was performed according to the departmental dose-optimization program which includes automated exposure control, adjustment of the mA and/or kV according to patient size and/or use of iterative reconstruction technique. COMPARISON:  Cervical spine CT 05/14/2022 FINDINGS: Alignment: No traumatic subluxation. Similar reversal of normal lordosis. Skull base and vertebrae: No acute fracture. Vertebral body heights are maintained. The dens and skull base are intact. Soft tissues and spinal canal: No prevertebral fluid or swelling. No visible canal hematoma. Disc levels: Degenerative disc disease from C3-C4 through C6-C7, stable. Upper chest: No acute or unexpected findings. Other: None. IMPRESSION: Stable degenerative change in the cervical spine without acute fracture or subluxation. Electronically Signed   By: Keith Rake M.D.   On: 07/01/2022 15:41   CT Head Wo Contrast  Result Date: 07/01/2022 CLINICAL DATA:  Post fall. EXAM: CT HEAD WITHOUT CONTRAST TECHNIQUE: Contiguous axial images were obtained from the base of the skull through the vertex without intravenous contrast. RADIATION DOSE REDUCTION: This exam was performed according to the departmental dose-optimization program which includes automated exposure control, adjustment of the mA and/or kV according to patient size and/or use of iterative reconstruction technique. COMPARISON:  05/14/2022 FINDINGS: Brain: No acute intracranial hemorrhage. No subdural or extra-axial collection. Stable degree of atrophy and chronic small vessel ischemia. No evidence of acute infarct. No midline shift, mass effect or hydrocephalus. Vascular: Atherosclerosis of skullbase vasculature without hyperdense vessel or abnormal calcification. Skull: No fracture or focal lesion. Sinuses/Orbits: Paranasal sinuses and mastoid air cells are clear.  The visualized orbits are unremarkable. Other: No confluent scalp hematoma. IMPRESSION: 1. No acute intracranial abnormality. No skull fracture. 2. Stable atrophy and chronic small vessel ischemia. Electronically Signed   By: Keith Rake M.D.   On: 07/01/2022 15:38   (Echo, Carotid, EGD, Colonoscopy, ERCP)    Subjective: No complaints  Discharge Exam: Vitals:   07/04/22 1218 07/04/22 2015  BP: 110/78 (!) 152/91  Pulse: 100 (!) 102  Resp: 17   Temp: 99.9 F (37.7 C) 98.6 F (37 C)  SpO2: 96% 96%   Vitals:   07/03/22 1949 07/04/22 0823 07/04/22 1218 07/04/22 2015  BP: 106/83 (!) 141/78 110/78 (!) 152/91  Pulse: (!) 102 (!) 107 100 (!) 102  Resp: 15  17   Temp: 97.9 F (36.6 C) 98.4 F (36.9 C) 99.9 F (37.7 C) 98.6 F (37 C)  TempSrc:  Oral  Oral  SpO2: (!) 82% 99% 96% 96%  Weight:      Height:        General: Pt is alert, awake, not in acute distress Cardiovascular: RRR, S1/S2 +, no rubs, no gallops Respiratory: CTA bilaterally, no wheezing, no rhonchi Abdominal: Soft, NT, ND, bowel sounds + Extremities: no edema, no cyanosis    The results of significant diagnostics from this hospitalization (including imaging, microbiology, ancillary and laboratory) are listed below for reference.     Microbiology: Recent Results (from the past 240 hour(s))  Surgical pcr  screen     Status: None   Collection Time: 07/02/22  2:07 AM  Result Value Ref Range Status   MRSA, PCR NEGATIVE NEGATIVE Final   Staphylococcus aureus NEGATIVE NEGATIVE Final    Comment: (NOTE) The Xpert SA Assay (FDA approved for NASAL specimens in patients 42 years of age and older), is one component of a comprehensive surveillance program. It is not intended to diagnose infection nor to guide or monitor treatment. Performed at [facility], [place] [address], [place], [place] [address]   Surgical pcr screen     Status: None   Collection Time: 07/02/22 12:07 PM   Specimen: Nasal Mucosa; Nasal  Swab  Result Value Ref Range Status   MRSA, PCR NEGATIVE NEGATIVE Final   Staphylococcus aureus NEGATIVE NEGATIVE Final    Comment: (NOTE) The Xpert SA Assay (FDA approved for NASAL specimens in patients 3 years of age and older), is one component of a comprehensive surveillance program. It is not intended to diagnose infection nor to guide or monitor treatment. Performed at [facility], [place] [address], [place], [place] [address]      Labs: BNP (last 3 results) No results for input(s): "BNP" in the last 8760 hours. Basic Metabolic Panel: Recent Labs  Lab 07/01/22 1545 07/02/22 1542 07/03/22 0429  NA 142  --  142  K 4.0  --  3.7  CL 109  --  109  CO2 25  --  23  GLUCOSE 172*  --  132*  BUN 26*  --  16  CREATININE 1.16* 0.88 0.75  CALCIUM 9.3  --  9.3   Liver Function Tests: No results for input(s): "AST", "ALT", "ALKPHOS", "BILITOT", "PROT", "ALBUMIN" in the last 168 hours. No results for input(s): "LIPASE", "AMYLASE" in the last 168 hours. No results for input(s): "AMMONIA" in the last 168 hours. CBC: Recent Labs  Lab 07/01/22 1545 07/02/22 1542 07/03/22 0429 07/04/22 0427  WBC 9.1 8.4 9.5 7.4  NEUTROABS 7.3  --   --   --   HGB 11.2* 10.7* 10.7* 11.1*  HCT 36.6 34.9* 34.1* 33.2*  MCV 94.1 93.6 92.4 89.0  PLT 144* 114* 122* 132*   Cardiac Enzymes: No results for input(s): "CKTOTAL", "CKMB", "CKMBINDEX", "TROPONINI" in the last 168 hours. BNP: Invalid input(s): "POCBNP" CBG: Recent Labs  Lab 07/03/22 1947 07/04/22 0827 07/04/22 1138 07/04/22 1602 07/04/22 2012  GLUCAP 115* 119* 125* 100* 132*   D-Dimer No results for input(s): "DDIMER" in the last 72 hours. Hgb A1c No results for input(s): "HGBA1C" in the last 72 hours.  Lipid Profile No results for input(s): "CHOL", "HDL", "LDLCALC", "TRIG", "CHOLHDL", "LDLDIRECT" in the last 72 hours. Thyroid function studies No results for input(s): "TSH", "T4TOTAL", "T3FREE", "THYROIDAB" in the last 72  hours.  Invalid input(s): "FREET3" Anemia work up No results for input(s): "VITAMINB12", "FOLATE", "FERRITIN", "TIBC", "IRON", "RETICCTPCT" in the last 72 hours. Urinalysis    Component Value Date/Time   COLORURINE YELLOW 07/01/2022 2121   APPEARANCEUR TURBID (A) 07/01/2022 2121   LABSPEC 1.015 07/01/2022 2121   PHURINE 6.0 07/01/2022 2121   GLUCOSEU NEGATIVE 07/01/2022 2121   HGBUR SMALL (A) 07/01/2022 2121   BILIRUBINUR NEGATIVE 07/01/2022 2121   KETONESUR 20 (A) 07/01/2022 2121   PROTEINUR >=300 (A) 07/01/2022 2121   UROBILINOGEN 1.0 09/12/2011 0049   NITRITE POSITIVE (A) 07/01/2022 2121   LEUKOCYTESUR LARGE (A) 07/01/2022 2121   Sepsis Labs Recent Labs  Lab 07/01/22 1545 07/02/22 1542 07/03/22 0429  07/04/22 0427  WBC 9.1 8.4 9.5 7.4   Microbiology Recent Results (from the past 240 hour(s))  Surgical pcr screen     Status: None   Collection Time: 07/02/22  2:07 AM  Result Value Ref Range Status   MRSA, PCR NEGATIVE NEGATIVE Final   Staphylococcus aureus NEGATIVE NEGATIVE Final    Comment: (NOTE) The Xpert SA Assay (FDA approved for NASAL specimens in patients 16 years of age and older), is one component of a comprehensive surveillance program. It is not intended to diagnose infection nor to guide or monitor treatment. Performed at [facility], [place] [address], [place], [place] [address]   Surgical pcr screen     Status: None   Collection Time: 07/02/22 12:07 PM   Specimen: Nasal Mucosa; Nasal Swab  Result Value Ref Range Status   MRSA, PCR NEGATIVE NEGATIVE Final   Staphylococcus aureus NEGATIVE NEGATIVE Final    Comment: (NOTE) The Xpert SA Assay (FDA approved for NASAL specimens in patients 41 years of age and older), is one component of a comprehensive surveillance program. It is not intended to diagnose infection nor to guide or monitor treatment. Performed at [facility], [place] [address], [place], [place] [address]     SIGNED:   Charlynne Cousins, MD  [facility] 07/05/2022, 7:35 AM Pager   If 7PM-7AM, please contact night-coverage [email or web address] Password [email or web address]

## 2022-07-05 NOTE — TOC Progression Note (Signed)
Transition of Care [facility]) - Progression Note    Patient Details  Name: [name] MRN: [id number] Date of Birth: 1950/09/10  Transition of [facility]) CM/SW Contact  Joanne Chars, LCSW Phone Number: 07/05/2022, 12:56 PM  Clinical Narrative:    Josem Kaufmann request still pending in navi.   Expected Discharge Plan: Why Barriers to Discharge: Continued Medical Work up, SNF Pending bed offer  Expected Discharge Plan and Services Expected Discharge Plan: [place] In-house Referral: Clinical Social Work   Post Acute Care Choice: [place] arrangements for the past 2 months: [place] ([facility]-memory care) Expected Discharge Date: 07/04/22                                     Social Determinants of Health (SDOH) Interventions    Readmission Risk Interventions     No data to display

## 2022-07-06 DIAGNOSIS — W19XXXA Unspecified fall, initial encounter: Secondary | ICD-10-CM

## 2022-07-06 DIAGNOSIS — S72001A Fracture of unspecified part of neck of right femur, initial encounter for closed fracture: Secondary | ICD-10-CM | POA: Diagnosis not present

## 2022-07-06 LAB — GLUCOSE, CAPILLARY
Glucose-Capillary: 115 mg/dL — ABNORMAL HIGH (ref 70–99)
Glucose-Capillary: 117 mg/dL — ABNORMAL HIGH (ref 70–99)
Glucose-Capillary: 122 mg/dL — ABNORMAL HIGH (ref 70–99)
Glucose-Capillary: 132 mg/dL — ABNORMAL HIGH (ref 70–99)
Glucose-Capillary: 88 mg/dL (ref 70–99)

## 2022-07-06 NOTE — Progress Notes (Signed)
71 y.o. female past medical history significant for severe dementia, diabetes mellitus type 2 hyperlipidemia resides at a memory care unit facility had a fall started complaining of right hip pain, x-ray and CT of her hip show impacted right femoral neck fracture.  S/p surgery.  Await placement in SNF.   D/C summary done.  Would recommend palliative care to follow at SNF.  Eulogio Bear DO

## 2022-07-06 NOTE — TOC Progression Note (Addendum)
Transition of Care [facility]) - Progression Note    Patient Details  Name: [name] MRN: [id number] Date of Birth: [birth year]  Transition of Care [facility]) CM/SW Contact  Angela Chars, LCSW Phone Number: 07/06/2022, 11:43 AM  Clinical Narrative:    Josem Kaufmann request still pending in [place].  1130: CSW spoke with navi.  They are requesting PT note that shows out of bed mobility.  They have 11/6 note that does not have OOB.  Pt was seen 11/8, did not get out of bed.  We have until 4pm to upload new note if PT can see again today.  CSW spoke with PT who will add pt.  1330: PT note uploaded  1500: Update provided to pt [name]  Expected Discharge Plan: Skilled Nursing Facility Barriers to Discharge: Continued Medical Work up, SNF Pending bed offer  Expected Discharge Plan and Services Expected Discharge Plan: [place] In-house Referral: Clinical Social Work   Post Acute Care Choice: [place] arrangements for the past 2 months: [place] ([facility] [place]-memory care) Expected Discharge Date: 07/06/22                                     Social Determinants of Health (SDOH) Interventions    Readmission Risk Interventions     No data to display

## 2022-07-06 NOTE — Progress Notes (Signed)
Physical Therapy Treatment Patient Details Name: [name] MRN: [id number] DOB: 03-Mar-1951 Today's Date: 07/06/2022   History of Present Illness Pt is a 71 y/o female who presents s/p fall at her memory unit and was admitted on 11/4 with a R intertrochanteric fracture. She is now s/p IM nailing on 11/5 and is WBAT.  PMHx:  DM with hyperglycemia, HLD, GERD, HTN,    PT Comments    Pt progressing towards physical therapy goals. Pt pleasant and sat EOB with therapist and mobility specialist to attempt increased engagement. Pt following minimal commands but demonstrated better return with hand-over-hand assist. SNF remains the most appropriate d/c disposition. Will continue to follow.     Recommendations for follow up therapy are one component of a multi-disciplinary discharge planning process, led by the attending physician.  Recommendations may be updated based on patient status, additional functional criteria and insurance authorization.  Follow Up Recommendations  Skilled nursing-short term rehab (<3 hours/day) Can patient physically be transported by private vehicle: No   Assistance Recommended at Discharge Frequent or constant Supervision/Assistance  Patient can return home with the following Two people to help with walking and/or transfers;A lot of help with bathing/dressing/bathroom;Direct supervision/assist for medications management;Direct supervision/assist for financial management;Assist for transportation   Equipment Recommendations  Rolling walker (2 wheels)    Recommendations for Other Services       Precautions / Restrictions Precautions Precautions: Fall Precaution Comments: mitts Restrictions Weight Bearing Restrictions: No RLE Weight Bearing: Weight bearing as tolerated     Mobility  Bed Mobility Overal bed mobility: Needs Assistance Bed Mobility: Supine to Sit, Sit to Supine     Supine to sit: Max assist, +2 for physical assistance, HOB elevated Sit to  supine: Max assist, +2 for physical assistance   General bed mobility comments: Hand over hand assist for pt to reach to L railing with RUE. Pt able to advance LE's towards EOB with assist.    Transfers                   General transfer comment: Did not attempt OOB at this time as pt with eyes closed and not maintaining alertness sufficient to progress mobility.    Ambulation/Gait               General Gait Details: unable to attempt   Stairs             Wheelchair Mobility    Modified Rankin (Stroke Patients Only)       Balance Overall balance assessment: Needs assistance, History of Falls Sitting-balance support: Feet supported, Single extremity supported Sitting balance-Leahy Scale: Poor Sitting balance - Comments: Poor initially, then zero as requiring up to max assist. Postural control: Posterior lean, Right lateral lean                                  Cognition Arousal/Alertness: Awake/alert (initially, then became lethargic as session progressed) Behavior During Therapy: Flat affect, Anxious Overall Cognitive Status: History of cognitive impairments - at baseline                                 General Comments: from memory care - likely near baseline        Exercises General Exercises - Lower Extremity Long Arc Quad: Left, 5 reps, AROM    General Comments  Pertinent Vitals/Pain Pain Assessment Pain Assessment: Faces Faces Pain Scale: Hurts a little bit Pain Location: R hip Pain Descriptors / Indicators: Other (Comment), Operative site guarding, Grimacing Pain Intervention(s): Limited activity within patient's tolerance, Monitored during session, Repositioned    Home Living                          Prior Function            PT Goals (current goals can now be found in the care plan section) Acute Rehab PT Goals Patient Stated Goal: None stated PT Goal Formulation: Patient unable  to participate in goal setting Time For Goal Achievement: 07/17/22 Potential to Achieve Goals: Good Progress towards PT goals: Progressing toward goals    Frequency    Min 3X/week      PT Plan Current plan remains appropriate    Co-evaluation              AM-PAC PT "6 Clicks" Mobility   Outcome Measure  Help needed turning from your back to your side while in a flat bed without using bedrails?: A Lot Help needed moving from lying on your back to sitting on the side of a flat bed without using bedrails?: A Lot Help needed moving to and from a bed to a chair (including a wheelchair)?: Total Help needed standing up from a chair using your arms (e.g., wheelchair or bedside chair)?: Total Help needed to walk in hospital room?: Total Help needed climbing 3-5 steps with a railing? : Total 6 Click Score: 8    End of Session   Activity Tolerance: Other (comment) (Limited by cognition) Patient left: in bed;with call bell/phone within reach;with bed alarm set Nurse Communication: Mobility status PT Visit Diagnosis: Pain;Muscle weakness (generalized) (M62.81);History of falling (Z91.81);Difficulty in walking, not elsewhere classified (R26.2) Pain - Right/Left: Right Pain - part of body: Hip     Time: 1150-1206 PT Time Calculation (min) (ACUTE ONLY): 16 min  Charges:  $Therapeutic Activity: 8-22 mins                     Rolinda Roan, PT, DPT Acute Rehabilitation Services Secure Chat Preferred Office: [phone]    [name] 07/06/2022, 1:23 PM

## 2022-07-07 DIAGNOSIS — R627 Adult failure to thrive: Secondary | ICD-10-CM

## 2022-07-07 DIAGNOSIS — W19XXXA Unspecified fall, initial encounter: Secondary | ICD-10-CM | POA: Diagnosis not present

## 2022-07-07 DIAGNOSIS — S72001A Fracture of unspecified part of neck of right femur, initial encounter for closed fracture: Secondary | ICD-10-CM | POA: Diagnosis not present

## 2022-07-07 DIAGNOSIS — Z7189 Other specified counseling: Secondary | ICD-10-CM | POA: Diagnosis not present

## 2022-07-07 DIAGNOSIS — F03C0 Unspecified dementia, severe, without behavioral disturbance, psychotic disturbance, mood disturbance, and anxiety: Secondary | ICD-10-CM

## 2022-07-07 DIAGNOSIS — Z66 Do not resuscitate: Secondary | ICD-10-CM

## 2022-07-07 DIAGNOSIS — Z515 Encounter for palliative care: Secondary | ICD-10-CM

## 2022-07-07 LAB — GLUCOSE, CAPILLARY
Glucose-Capillary: 112 mg/dL — ABNORMAL HIGH (ref 70–99)
Glucose-Capillary: 119 mg/dL — ABNORMAL HIGH (ref 70–99)
Glucose-Capillary: 135 mg/dL — ABNORMAL HIGH (ref 70–99)
Glucose-Capillary: 120 mg/dL — ABNORMAL HIGH (ref 70–99)

## 2022-07-07 NOTE — TOC CAGE-AID Note (Signed)
Transition of Care [facility]) - CAGE-AID Screening   Patient Details  Name: [name] MRN: [id number] Date of Birth: 1951-07-11  Transition of Care [facility]) CM/SW Contact:    Clovis Cao, RN Phone Number: [phone] 07/07/2022, 12:38 PM   Clinical Narrative: Pt has hx of advanced dementia.  Therefore, screening is unable to be complete.   CAGE-AID Screening: Substance Abuse Screening unable to be completed due to: : Patient unable to participate

## 2022-07-07 NOTE — TOC Progression Note (Addendum)
Transition of Care [facility]) - Progression Note    Patient Details  Name: [name] MRN: [id number] Date of Birth: Jan 08, 1951  Transition of Care [facility]) CM/SW Contact  Joanne Chars, LCSW Phone Number: 07/07/2022, 2:57 PM  Clinical Narrative:   Josem Kaufmann remains pending in [place].  1610: TC Navi.  They are offering peer to peer before making decision.  MD call [phone], option 5 before Monday, 11/13 at 1130am.  Will need pt insurance ID:  [id number]  MD informed  Expected Discharge Plan: [place] Barriers to Discharge: Continued Medical Work up, SNF Pending bed offer  Expected Discharge Plan and Services Expected Discharge Plan: [place] In-house Referral: Clinical Social Work   Post Acute Care Choice: [place] arrangements for the past 2 months: [place] ([facility]-memory care) Expected Discharge Date: 07/06/22                                     Social Determinants of Health (SDOH) Interventions    Readmission Risk Interventions     No data to display

## 2022-07-07 NOTE — Consult Note (Signed)
Consultation Note Date: 07/07/2022   Patient Name: [name]  DOB: [birth year]  MRN: [id number]  Age / Sex: 71 y.o., female  PCP: Caryl Bis, MD Referring Physician: Geradine Girt, DO  Reason for Consultation: Establishing goals of care  HPI/Patient Profile: 71 y.o. female  with past medical history of severe dementia, type 2 diabetes, hyperlipidemia, GERD, and depression  admitted on 07/01/2022 with fall. X-ray and CT of her hip show impacted right femoral neck fracture; S/p surgical repair. Patient with significant loss of function and poor PO intake. PMT consulted to discuss [place].   RN at bedside has been with patient for 3 days in a row and reports multiple attempts at PO intake with very little success at getting patient to take in liquids or solids. I also sat at bedside and attempted to feed patient with no success.   Clinical Assessment and Goals of Care: I have reviewed medical records including EPIC notes, labs and imaging, received report from RN and Dr. Eliseo Squires, assessed the patient and then spoke with patient's Sister [name] to discuss diagnosis prognosis, [place], EOL wishes, disposition and options.  [name] tells me she is HCPOA and makes medical decisions for the patient.  I introduced Palliative Medicine as specialized medical care for people living with serious illness. It focuses on providing relief from the symptoms and stress of a serious illness. The goal is to improve quality of life for both the patient and the family.  We discussed a brief life review of the patient.  [name] tells me patient's spouse used to be her main caregiver however he passed away 4 years ago and since that time her siblings have been assisting with her medical care.  She tells me the patient has been living in a memory care unit for the past 4 years.  As far as functional and nutritional status she tells me prior to fall patient could walk around her facility  but did need some assistance.  She tells me she is dependent on others to be fed.  She tells me she would speak some but most of her speech was incoherent.   We discussed patient's current illness and what it means in the larger context of patient's on-going co-morbidities.  Natural disease trajectory and expectations at EOL were discussed.  We discussed that since her fall and surgery patient has not been able to work much with therapy.  We discussed her poor p.o. intake.  Sister tells me patient needs significant amount of help to take in nutrition.  We reviewed that her current nurse has had her for the past 3 days and is worked diligently to increase her intake with no success.  I reviewed with her that I also sat at the bedside and attempted to help patient sip different liquids and take a bite of macaroni and cheese but with no success.  Her sister tells me when she visited several days ago she saw patient refused applesauce.  I attempted to elicit values and goals of care important to the patient.    The difference between aggressive medical intervention and comfort care was considered in light of the patient's goals of care.   We discussed that given patient's functional decline as well as her poor p.o. intake it may be most appropriate to involve hospice.  We discussed the transition from her care to focus on comfort and quality of life and avoid aggressive medical interventions.  Her sister seems surprised that she may be  eligible for hospice but also seems to indicate that this would be in line with Ms. Zoeller goals of care.  Encouraged family to consider DNR/DNI status understanding evidenced based poor outcomes in similar hospitalized patients, as the cause of the arrest is likely associated with chronic/terminal disease rather than a reversible acute cardio-pulmonary event.  Her sister indicates DNR/DNI status would be in line with patient's wishes.   Discussed with sister the importance  of continued conversation with family and the medical providers regarding overall plan of care and treatment options, ensuring decisions are within the context of the patient's values and GOCs.    Questions and concerns were addressed. The family was encouraged to call with questions or concerns.     Primary Decision Maker HCPOA -sister [name]    SUMMARY OF RECOMMENDATIONS   - patient with decline in function and nutrition - discussed with her sister and HCPOA - discussed concern for poor prognosis - sister expresses understanding - discussed hospice involvement may be appropriate - wait to see if approved for rehab, if not, sister will consider discharge to previous facility with hospice - code status change to DNR - liberalize diet to regular, dc heart healthy - ?SLP consult  Code Status/Advance Care Planning: DNR      Primary Diagnoses: Present on Admission:  Hypertension  GERD (gastroesophageal reflux disease)  Hyperlipidemia  Closed right hip fracture ([place])   I have reviewed the medical record, interviewed the patient and family, and examined the patient. The following aspects are pertinent.  Past Medical History:  Diagnosis Date   Arthritis    fingers   Dementia ([place])    Depression    GERD (gastroesophageal reflux disease)    Headache(784.0)    History of hypertension    NO MEDS NOW   History of kidney stones    Hydronephrosis, right    Hyperlipidemia    Renal calculi    LEFT   Type 2 diabetes mellitus (HCC)    Urge urinary incontinence    Wears glasses    Social History   Socioeconomic History   Marital status: Married    Spouse name: Nicole Kindred   Number of children: 0   Years of education: 14   Highest education level: Not on file  Occupational History    Comment: retired  Tobacco Use   Smoking status: Never   Smokeless tobacco: Never  Substance and Sexual Activity   Alcohol use: No   Drug use: No   Sexual activity: Not on file  Other Topics Concern    Not on file  Social History Narrative   [place]   Caffeine use- tea, soda- 4 glasses daily   Social Determinants of Health   Financial Resource Strain: Not on file  Food Insecurity: Not on file  Transportation Needs: Not on file  Physical Activity: Not on file  Stress: Not on file  Social Connections: Not on file   Family History  Problem Relation Age of Onset   Dementia Father    Anesthesia problems Neg Hx    Hypotension Neg Hx    Malignant hyperthermia Neg Hx    Pseudochol deficiency Neg Hx    Scheduled Meds:  atorvastatin  40 mg Oral QHS   citalopram  10 mg Oral Daily   divalproex  125 mg Oral 2 times per day   divalproex  250 mg Oral Q24H   docusate sodium  100 mg Oral BID   enoxaparin (LOVENOX) injection  40 mg Subcutaneous Q24H   famotidine  10 mg Oral BID   insulin aspart  0-15 Units Subcutaneous TID WC   insulin aspart  0-5 Units Subcutaneous QHS   mirtazapine  30 mg Oral QHS   montelukast  10 mg Oral QHS   Continuous Infusions:  sodium chloride 10 mL/hr at 07/03/22 0942   methocarbamol (ROBAXIN) IV     PRN Meds:.acetaminophen, HYDROcodone-acetaminophen, methocarbamol **OR** methocarbamol (ROBAXIN) IV, metoCLOPramide **OR** metoCLOPramide (REGLAN) injection, morphine injection, [DISCONTINUED] ondansetron **OR** ondansetron (ZOFRAN) IV, ondansetron **OR** ondansetron (ZOFRAN) IV, polyethylene glycol Allergies  Allergen Reactions   Flomax [Tamsulosin Hcl] Swelling    JAWS   Sulfa Antibiotics Swelling   Review of Systems  Unable to perform ROS: Dementia    Physical Exam Constitutional:      General: She is not in acute distress.    Appearance: She is ill-appearing.  Pulmonary:     Effort: Pulmonary effort is normal.  Skin:    General: Skin is warm and dry.  Neurological:     Mental Status: She is alert. She is disoriented.     Vital Signs: BP 130/79 (BP Location: Right Arm)   Pulse 91   Temp 97.6 F (36.4 C) (Oral)   Resp  18   Ht 5' 2.01" (1.575 m)   Wt 50.9 kg   SpO2 98%   BMI 20.52 kg/m  Pain Scale: PAINAD   Pain Score: 0-No pain   SpO2: SpO2: 98 % O2 Device:SpO2: 98 % O2 Flow Rate: .   IO: Intake/output summary:  Intake/Output Summary (Last 24 hours) at 07/07/2022 1522 Last data filed at 07/07/2022 1214 Gross per 24 hour  Intake --  Output 700 ml  Net -700 ml    LBM: Last BM Date : 07/05/22 Baseline Weight: Weight: 50.9 kg Most recent weight: Weight: 50.9 kg     Palliative Assessment/Data: PPS 20%     *Please note that this is a verbal dictation therefore any spelling or grammatical errors are due to the "[place] [employer]" system interpretation.  Juel Burrow, DNP, AGNP-C Palliative Medicine Team [phone] Pager: [phone]

## 2022-07-07 NOTE — Progress Notes (Signed)
[facility] PROGRESS NOTE    Progress Note  [name]  WER:[id number] DOB: 1950/09/28 DOA: 07/01/2022 PCP: Angela Bis, MD     Brief Narrative:   71 y.o. female past medical history significant for severe dementia, diabetes mellitus type 2 hyperlipidemia resides at a memory care unit facility had a fall started complaining of right hip pain, x-ray and CT of her hip show impacted right femoral neck fracture.  S/p surgery.  Await placement in SNF.   D/C summary done.  Would recommend palliative care to follow at SNF but since insurance has been dragging their feet with authorization, will get palliative care to see here.   Assessment/Plan:   Closed right hip fracture (HCC) Status post several medullary nailing of right intertrochanteric femur -await placement  Diabetes mellitus type 2 with hyperglycemia: A1c of 5.5   Hyperlipidemia Restart statins.  GERD (gastroesophageal reflux disease) PPI  Severe dementia -will ask palliative care to see here    Status is: Inpatient Remains inpatient appropriate because: await placement    Code Status: full         Medical Consultants:   Ortho Palliative care   Subjective:    [name] no complaints today.  Objective:    Vitals:   07/06/22 0920 07/06/22 1455 07/06/22 2023 07/07/22 0840  BP: (!) 145/87 120/72 100/78 128/77  Pulse: 97 100 94 78  Resp: '17 17 16 17  '$ Temp: 98 F (36.7 C) 98.4 F (36.9 C) 98.1 F (36.7 C) 97.6 F (36.4 C)  TempSrc:   Axillary   SpO2: 99% 97% 96% 99%  Weight:      Height:       SpO2: 99 %   Intake/Output Summary (Last 24 hours) at 07/07/2022 0924 Last data filed at 07/07/2022 0600 Gross per 24 hour  Intake --  Output 300 ml  Net -300 ml   Filed Weights   07/02/22 0237 07/02/22 1215  Weight: 50.9 kg 50.9 kg    Exam:  General: Appearance:    Well developed, well nourished female in no acute distress     Lungs:      respirations unlabored   Heart:    Normal heart rate.   MS:   All extremities are intact.   Neurologic:   sleeping      Data Reviewed:    Labs: Basic Metabolic Panel: Recent Labs  Lab 07/01/22 1545 07/02/22 1542 07/03/22 0429  NA 142  --  142  K 4.0  --  3.7  CL 109  --  109  CO2 25  --  23  GLUCOSE 172*  --  132*  BUN 26*  --  16  CREATININE 1.16* 0.88 0.75  CALCIUM 9.3  --  9.3   GFR Estimated Creatinine Clearance: 51.8 mL/min (by C-G formula based on SCr of 0.75 mg/dL). Liver Function Tests: No results for input(s): "AST", "ALT", "ALKPHOS", "BILITOT", "PROT", "ALBUMIN" in the last 168 hours. No results for input(s): "LIPASE", "AMYLASE" in the last 168 hours. No results for input(s): "AMMONIA" in the last 168 hours. Coagulation profile No results for input(s): "INR", "PROTIME" in the last 168 hours. COVID-19 Labs  No results for input(s): "DDIMER", "FERRITIN", "LDH", "CRP" in the last 72 hours.  Lab Results  Component Value Date   [place] NEGATIVE 01/26/2021    CBC: Recent Labs  Lab 07/01/22 1545 07/02/22 1542 07/03/22 0429 07/04/22 0427  WBC 9.1 8.4 9.5 7.4  NEUTROABS 7.3  --   --   --  HGB 11.2* 10.7* 10.7* 11.1*  HCT 36.6 34.9* 34.1* 33.2*  MCV 94.1 93.6 92.4 89.0  PLT 144* 114* 122* 132*   Cardiac Enzymes: No results for input(s): "CKTOTAL", "CKMB", "CKMBINDEX", "TROPONINI" in the last 168 hours. BNP (last 3 results) No results for input(s): "PROBNP" in the last 8760 hours. CBG: Recent Labs  Lab 07/06/22 1213 07/06/22 1619 07/06/22 1632 07/06/22 2008 07/07/22 0843  GLUCAP 117* 122* 132* 88 112*   D-Dimer: No results for input(s): "DDIMER" in the last 72 hours. Hgb A1c: No results for input(s): "HGBA1C" in the last 72 hours.  Lipid Profile: No results for input(s): "CHOL", "HDL", "LDLCALC", "TRIG", "CHOLHDL", "LDLDIRECT" in the last 72 hours. Thyroid function studies: No results for input(s): "TSH", "T4TOTAL", "T3FREE", "THYROIDAB" in the last 72  hours.  Invalid input(s): "FREET3" Anemia work up: No results for input(s): "VITAMINB12", "FOLATE", "FERRITIN", "TIBC", "IRON", "RETICCTPCT" in the last 72 hours. Sepsis Labs: Recent Labs  Lab 07/01/22 1545 07/02/22 1542 07/03/22 0429 07/04/22 0427  WBC 9.1 8.4 9.5 7.4   Microbiology Recent Results (from the past 240 hour(s))  Surgical pcr screen     Status: None   Collection Time: 07/02/22  2:07 AM  Result Value Ref Range Status   MRSA, PCR NEGATIVE NEGATIVE Final   Staphylococcus aureus NEGATIVE NEGATIVE Final    Comment: (NOTE) The Xpert SA Assay (FDA approved for NASAL specimens in patients 59 years of age and older), is one component of a comprehensive surveillance program. It is not intended to diagnose infection nor to guide or monitor treatment. Performed at [facility], [place] [address], [place], [place] [address]   Surgical pcr screen     Status: None   Collection Time: 07/02/22 12:07 PM   Specimen: Nasal Mucosa; Nasal Swab  Result Value Ref Range Status   MRSA, PCR NEGATIVE NEGATIVE Final   Staphylococcus aureus NEGATIVE NEGATIVE Final    Comment: (NOTE) The Xpert SA Assay (FDA approved for NASAL specimens in patients 7 years of age and older), is one component of a comprehensive surveillance program. It is not intended to diagnose infection nor to guide or monitor treatment. Performed at [facility], [place] [address], [place], [place] [address]      Medications:    atorvastatin  40 mg Oral QHS   citalopram  10 mg Oral Daily   divalproex  125 mg Oral 2 times per day   divalproex  250 mg Oral Q24H   docusate sodium  100 mg Oral BID   enoxaparin (LOVENOX) injection  40 mg Subcutaneous Q24H   famotidine  10 mg Oral BID   insulin aspart  0-15 Units Subcutaneous TID WC   insulin aspart  0-5 Units Subcutaneous QHS   mirtazapine  30 mg Oral QHS   montelukast  10 mg Oral QHS   Continuous Infusions:  sodium chloride 10 mL/hr at 07/03/22 0942    methocarbamol (ROBAXIN) IV        LOS: 6 days   [name]  [facility]  07/07/2022, 9:24 AM

## 2022-07-08 DIAGNOSIS — S72001D Fracture of unspecified part of neck of right femur, subsequent encounter for closed fracture with routine healing: Secondary | ICD-10-CM

## 2022-07-08 DIAGNOSIS — Z7189 Other specified counseling: Secondary | ICD-10-CM | POA: Diagnosis not present

## 2022-07-08 DIAGNOSIS — Z515 Encounter for palliative care: Secondary | ICD-10-CM | POA: Diagnosis not present

## 2022-07-08 LAB — GLUCOSE, CAPILLARY
Glucose-Capillary: 104 mg/dL — ABNORMAL HIGH (ref 70–99)
Glucose-Capillary: 111 mg/dL — ABNORMAL HIGH (ref 70–99)
Glucose-Capillary: 105 mg/dL — ABNORMAL HIGH (ref 70–99)
Glucose-Capillary: 124 mg/dL — ABNORMAL HIGH (ref 70–99)

## 2022-07-08 MED ORDER — DRONABINOL 2.5 MG PO CAPS
2.5000 mg | ORAL_CAPSULE | Freq: Two times a day (BID) | ORAL | Status: DC
  Administered 2022-07-08 – 2022-07-14 (×11): 2.5 mg via ORAL
  Filled 2022-07-08 (×12): qty 1

## 2022-07-08 NOTE — Progress Notes (Addendum)
Palliative Medicine Inpatient Follow Up Note HPI: 71 y.o. female  with past medical history of severe dementia, type 2 diabetes, hyperlipidemia, GERD, and depression  admitted on 07/01/2022 with fall. X-ray and CT of her hip show impacted right femoral neck fracture; S/p surgical repair. Patient with significant loss of function and poor PO intake. PMT consulted to discuss [place].    RN at bedside has been with patient for 3 days in a row and reports multiple attempts at PO intake with very little success at getting patient to take in liquids or solids. I also sat at bedside and attempted to feed patient with no success.    Today's Discussion 07/08/2022  *Please note that this is a verbal dictation therefore any spelling or grammatical errors are due to the "[place] One" system interpretation.  Chart reviewed inclusive of vital signs, progress notes, laboratory results, and diagnostic images.   I met with [name] this morning. She is alert to self and pleasantly confused regarding place and situation. I was able to manipulate [name]'s RLE and she denied pain. She is not able to flex it for me.    Per her RN, no significant concerns  I called patients sister, [name] this morning though her husband, [name] answered the phone. He shares that they spoke more about the plan moving forward. He expresses that At this time if [name] cannot go to rehabilitation they would like to explore if she could go back to [place]. He goes on to say that he does not feel they would desire hospice at this time.   Questions and concerns addressed/Palliative Support Provided.  ____________________________________ Addendum:  I spoke to patients [name] and her spouse, [name] later in the afternoon. We were able to discuss the concerns in the setting of patients poor oral intake and inability to mobilize. They vocalize the desire to see how she can do in the oncoming days. I did broach the topic of hospice if she  should continue to decline as I worry if she becomes bed-bound it leaves her at great risk for pneumonia, UTIs, pressure injuries, and worse frailty. I shared that these are very concerning for an elderly patient like [name]. They did understand this though remain optimistic that she will be accepted to SNF.  Objective Assessment: Vital Signs Vitals:   07/08/22 0358 07/08/22 0922  BP: 124/78 104/79  Pulse: 93 87  Resp: 17   Temp: 97.9 F (36.6 C) 98.3 F (36.8 C)  SpO2: 97%     Intake/Output Summary (Last 24 hours) at 07/08/2022 1028 Last data filed at 07/07/2022 1800 Gross per 24 hour  Intake 10 ml  Output 400 ml  Net -390 ml   Last Weight  Most recent update: 07/02/2022 12:16 PM    Weight  50.9 kg (112 lb 3.4 oz)            Gen:  Frail elderly F in NAD HEENT: Dry mucous membranes CV: Regular rate and rhythm  PULM: On RA, breathing is even and nonlabored ABD: soft/nontender  EXT: No edema  Neuro: Alert to self  SUMMARY OF RECOMMENDATIONS   DNAR/DNI  Awaiting peer to peer response for possible rehab placement --> If patient cannot go to rehabilitation family is interested in her going back to [facility] not presently interested in Hospice care therefore will offer OP Palliative support  Added low dose Marinol for appetite support  Ongoing supportive care  Billing based on MDM: High ______________________________________________________________________________________ Tacey Ruiz  [place] Team Team Cell Phone: [phone] Please utilize secure chat with additional questions, if there is no response within 30 minutes please call the above phone number  Palliative Medicine Team providers are available by phone from 7am to 7pm daily and can be reached through the team cell phone.  Should this patient require assistance outside of these hours, please call the patient's attending physician.

## 2022-07-08 NOTE — Plan of Care (Signed)
  Problem: Metabolic: Goal: Ability to maintain appropriate glucose levels will improve Outcome: Progressing   Problem: Clinical Measurements: Goal: Respiratory complications will improve Outcome: Progressing Goal: Cardiovascular complication will be avoided Outcome: Progressing   Problem: Coping: Goal: Level of anxiety will decrease Outcome: Progressing   Problem: Pain Managment: Goal: General experience of comfort will improve Outcome: Progressing

## 2022-07-08 NOTE — Progress Notes (Signed)
[facility] PROGRESS NOTE    Progress Note  [name]  CVE:[id number] DOB: [birth year] DOA: 07/01/2022 PCP: Caryl Bis, MD     Brief Narrative:   71 y.o. female past medical history significant for severe dementia, diabetes mellitus type 2 hyperlipidemia resides at a memory care unit facility had a fall started complaining of right hip pain, x-ray and CT of her hip show impacted right femoral neck fracture.  S/p surgery.  Await placement in SNF.   D/C summary done.  Would recommend palliative care to follow at SNF but since insurance has been dragging their feet with authorization, will get palliative care to see here.  07/08/2022: Patient seen.  No new changes.  Patient is within disposition.  Apparently, insurance authorization is still pending.   Assessment/Plan:   Closed right hip fracture (HCC) Status post several medullary nailing of right intertrochanteric femur -await placement 07/08/2022: Pursue disposition.  Diabetes mellitus type 2 with hyperglycemia: A1c of 5.5   Hyperlipidemia Restart statins.  GERD (gastroesophageal reflux disease) PPI  Severe dementia -will ask palliative care to see here    Status is: Inpatient Remains inpatient appropriate because: await placement    Code Status: full         Medical Consultants:   Ortho Palliative care   Subjective:   No new complaints.  Objective:    Vitals:   07/07/22 0840 07/07/22 1152 07/07/22 1938 07/08/22 0358  BP: 128/77 130/79 (!) 129/93 124/78  Pulse: 78 91 93 93  Resp: '17 18 17 17  '$ Temp: 97.6 F (36.4 C) 97.6 F (36.4 C) 98.4 F (36.9 C) 97.9 F (36.6 C)  TempSrc:  Oral Oral   SpO2: 99% 98% 97% 97%  Weight:      Height:       SpO2: 97 %   Intake/Output Summary (Last 24 hours) at 07/08/2022 0812 Last data filed at 07/07/2022 1800 Gross per 24 hour  Intake 10 ml  Output 400 ml  Net -390 ml    Filed Weights   07/02/22 0237 07/02/22 1215  Weight: 50.9 kg  50.9 kg    Exam:  General: Appearance:  Not in any distress.  Awake and alert.      Lungs:      Clear to auscultation.    Heart:  S1-S2  MS: Fullness of the ankle  Neurologic: Awake and alert.  Patient moves all extremities.      Data Reviewed:    Labs: Basic Metabolic Panel: Recent Labs  Lab 07/01/22 1545 07/02/22 1542 07/03/22 0429  NA 142  --  142  K 4.0  --  3.7  CL 109  --  109  CO2 25  --  23  GLUCOSE 172*  --  132*  BUN 26*  --  16  CREATININE 1.16* 0.88 0.75  CALCIUM 9.3  --  9.3    GFR Estimated Creatinine Clearance: 51.8 mL/min (by C-G formula based on SCr of 0.75 mg/dL). Liver Function Tests: No results for input(s): "AST", "ALT", "ALKPHOS", "BILITOT", "PROT", "ALBUMIN" in the last 168 hours. No results for input(s): "LIPASE", "AMYLASE" in the last 168 hours. No results for input(s): "AMMONIA" in the last 168 hours. Coagulation profile No results for input(s): "INR", "PROTIME" in the last 168 hours. COVID-19 Labs  No results for input(s): "DDIMER", "FERRITIN", "LDH", "CRP" in the last 72 hours.  Lab Results  Component Value Date   [place] NEGATIVE 01/26/2021    CBC: Recent Labs  Lab 07/01/22 1545  07/02/22 1542 07/03/22 0429 07/04/22 0427  WBC 9.1 8.4 9.5 7.4  NEUTROABS 7.3  --   --   --   HGB 11.2* 10.7* 10.7* 11.1*  HCT 36.6 34.9* 34.1* 33.2*  MCV 94.1 93.6 92.4 89.0  PLT 144* 114* 122* 132*    Cardiac Enzymes: No results for input(s): "CKTOTAL", "CKMB", "CKMBINDEX", "TROPONINI" in the last 168 hours. BNP (last 3 results) No results for input(s): "PROBNP" in the last 8760 hours. CBG: Recent Labs  Lab 07/06/22 2008 07/07/22 0843 07/07/22 1105 07/07/22 1607 07/07/22 1938  GLUCAP 88 112* 119* 135* 120*    D-Dimer: No results for input(s): "DDIMER" in the last 72 hours. Hgb A1c: No results for input(s): "HGBA1C" in the last 72 hours.  Lipid Profile: No results for input(s): "CHOL", "HDL", "LDLCALC", "TRIG",  "CHOLHDL", "LDLDIRECT" in the last 72 hours. Thyroid function studies: No results for input(s): "TSH", "T4TOTAL", "T3FREE", "THYROIDAB" in the last 72 hours.  Invalid input(s): "FREET3" Anemia work up: No results for input(s): "VITAMINB12", "FOLATE", "FERRITIN", "TIBC", "IRON", "RETICCTPCT" in the last 72 hours. Sepsis Labs: Recent Labs  Lab 07/01/22 1545 07/02/22 1542 07/03/22 0429 07/04/22 0427  WBC 9.1 8.4 9.5 7.4    Microbiology Recent Results (from the past 240 hour(s))  Surgical pcr screen     Status: None   Collection Time: 07/02/22  2:07 AM  Result Value Ref Range Status   MRSA, PCR NEGATIVE NEGATIVE Final   Staphylococcus aureus NEGATIVE NEGATIVE Final    Comment: (NOTE) The Xpert SA Assay (FDA approved for NASAL specimens in patients 39 years of age and older), is one component of a comprehensive surveillance program. It is not intended to diagnose infection nor to guide or monitor treatment. Performed at [facility], [place] [address], [place], [place] [address]   Surgical pcr screen     Status: None   Collection Time: 07/02/22 12:07 PM   Specimen: Nasal Mucosa; Nasal Swab  Result Value Ref Range Status   MRSA, PCR NEGATIVE NEGATIVE Final   Staphylococcus aureus NEGATIVE NEGATIVE Final    Comment: (NOTE) The Xpert SA Assay (FDA approved for NASAL specimens in patients 4 years of age and older), is one component of a comprehensive surveillance program. It is not intended to diagnose infection nor to guide or monitor treatment. Performed at [facility], [place] [address], [place], [place] [address]      Medications:    atorvastatin  40 mg Oral QHS   citalopram  10 mg Oral Daily   divalproex  125 mg Oral 2 times per day   divalproex  250 mg Oral Q24H   docusate sodium  100 mg Oral BID   enoxaparin (LOVENOX) injection  40 mg Subcutaneous Q24H   famotidine  10 mg Oral BID   insulin aspart  0-15 Units Subcutaneous TID WC   insulin aspart  0-5  Units Subcutaneous QHS   mirtazapine  30 mg Oral QHS   montelukast  10 mg Oral QHS   Continuous Infusions:  sodium chloride 10 mL/hr at 07/03/22 0942   methocarbamol (ROBAXIN) IV        LOS: 7 days   Time spent: 35 minutes.  Bonnell Public, MD.  [facility]  07/08/2022, 8:12 AM

## 2022-07-09 DIAGNOSIS — Z515 Encounter for palliative care: Secondary | ICD-10-CM | POA: Diagnosis not present

## 2022-07-09 DIAGNOSIS — S72001D Fracture of unspecified part of neck of right femur, subsequent encounter for closed fracture with routine healing: Secondary | ICD-10-CM | POA: Diagnosis not present

## 2022-07-09 LAB — CREATININE, SERUM
Creatinine, Ser: 0.84 mg/dL (ref 0.44–1.00)
GFR, Estimated: >60 mL/min (ref >60)

## 2022-07-09 LAB — GLUCOSE, CAPILLARY
Glucose-Capillary: 117 mg/dL — ABNORMAL HIGH (ref 70–99)
Glucose-Capillary: 113 mg/dL — ABNORMAL HIGH (ref 70–99)
Glucose-Capillary: 124 mg/dL — ABNORMAL HIGH (ref 70–99)
Glucose-Capillary: 111 mg/dL — ABNORMAL HIGH (ref 70–99)

## 2022-07-09 NOTE — Progress Notes (Signed)
   Palliative Medicine Inpatient Follow Up Note HPI: 71 y.o. female  with past medical history of severe dementia, type 2 diabetes, hyperlipidemia, GERD, and depression  admitted on 07/01/2022 with fall. X-ray and CT of her hip show impacted right femoral neck fracture; S/p surgical repair. Patient with significant loss of function and poor PO intake. PMT consulted to discuss [place].    RN at bedside has been with patient for 3 days in a row and reports multiple attempts at PO intake with very little success at getting patient to take in liquids or solids. I also sat at bedside and attempted to feed patient with no success.    Today's Discussion 07/09/2022  *Please note that this is a verbal dictation therefore any spelling or grammatical errors are due to the "[place] One" system interpretation.  Chart reviewed inclusive of vital signs, progress notes, laboratory results, and diagnostic images.   I met with Krisinda this morning at bedside. She is resting comfortably and remains pleasantly confused. I asked permission to move her legs which was granted. She was able to bend RLE though if was clearly quite painful.   Plan to see If tomorrow patient will be approved for skilled nursing and if not family would like for her to go back to [facility] memory care. I did discuss with them the idea of OP Palliative support which they vocalized agreement with.  Objective Assessment: Vital Signs Vitals:   07/09/22 0454 07/09/22 0818  BP: 122/89 121/81  Pulse: 96 96  Resp: 16 18  Temp:  99.3 F (37.4 C)  SpO2:  98%   No intake or output data in the 24 hours ending 07/09/22 0937  Last Weight  Most recent update: 07/02/2022 12:16 PM    Weight  50.9 kg (112 lb 3.4 oz)            Gen:  Frail elderly F in NAD HEENT: Dry mucous membranes CV: Regular rate and rhythm  PULM: On RA, breathing is even and nonlabored ABD: soft/nontender  EXT: No edema  Neuro: Alert to self  SUMMARY OF  RECOMMENDATIONS   DNAR/DNI  Awaiting peer to peer response for possible rehab placement --> If patient cannot go to rehabilitation family is interested in her going back to [facility] not presently interested in Hospice care at this time --> OP Palliative support  Continue  low dose Marinol for appetite support  Ongoing incremental supportive care  Billing based on MDM: Moderate  _____________________________________________________________________________________ [place] Team Team Cell Phone: [phone] Please utilize secure chat with additional questions, if there is no response within 30 minutes please call the above phone number  Palliative Medicine Team providers are available by phone from 7am to 7pm daily and can be reached through the team cell phone.  Should this patient require assistance outside of these hours, please call the patient's attending physician.

## 2022-07-09 NOTE — Progress Notes (Signed)
[facility] PROGRESS NOTE    Progress Note  [name]  DGL:[id number] DOB: 07-08-1951 DOA: 07/01/2022 PCP: Caryl Bis, MD     Brief Narrative:   71 y.o. female past medical history significant for severe dementia, diabetes mellitus type 2 hyperlipidemia resides at a memory care unit facility had a fall started complaining of right hip pain, x-ray and CT of her hip show impacted right femoral neck fracture.  S/p surgery.  Await placement in SNF.   D/C summary done.  Would recommend palliative care to follow at SNF but since insurance has been dragging their feet with authorization, will get palliative care to see here.  07/09/2022: Patient seen.  No new changes.  Patient is within disposition.  Apparently, insurance authorization is still pending.   Assessment/Plan:   Closed right hip fracture (HCC) Status post several medullary nailing of right intertrochanteric femur -await placement 07/09/2022: Pursue disposition.  Diabetes mellitus type 2 with hyperglycemia: A1c of 5.5   Hyperlipidemia Restart statins.  GERD (gastroesophageal reflux disease) PPI  Severe dementia -will ask palliative care to see here    Status is: Inpatient Remains inpatient appropriate because: await placement    Code Status: full         Medical Consultants:   Ortho Palliative care   Subjective:   No new complaints.  Objective:    Vitals:   07/09/22 0417 07/09/22 0454 07/09/22 0818 07/09/22 1438  BP: (!) 127/98 122/89 121/81 128/81  Pulse: (!) 112 96 96 99  Resp: '16 16 18 18  '$ Temp: 97.6 F (36.4 C)  99.3 F (37.4 C)   TempSrc:   Oral   SpO2: 100%  98% 98%  Weight:      Height:       SpO2: 98 %  No intake or output data in the 24 hours ending 07/09/22 1711  Filed Weights   07/02/22 0237 07/02/22 1215  Weight: 50.9 kg 50.9 kg    Exam:  General: Appearance:  Not in any distress.  Awake and alert.      Lungs:      Clear to auscultation.     Heart:  S1-S2  MS: Fullness of the ankle  Neurologic: Awake and alert.  Patient moves all extremities.      Data Reviewed:    Labs: Basic Metabolic Panel: Recent Labs  Lab 07/03/22 0429 07/09/22 0636  NA 142  --   K 3.7  --   CL 109  --   CO2 23  --   GLUCOSE 132*  --   BUN 16  --   CREATININE 0.75 0.84  CALCIUM 9.3  --     GFR Estimated Creatinine Clearance: 49.3 mL/min (by C-G formula based on SCr of 0.84 mg/dL). Liver Function Tests: No results for input(s): "AST", "ALT", "ALKPHOS", "BILITOT", "PROT", "ALBUMIN" in the last 168 hours. No results for input(s): "LIPASE", "AMYLASE" in the last 168 hours. No results for input(s): "AMMONIA" in the last 168 hours. Coagulation profile No results for input(s): "INR", "PROTIME" in the last 168 hours. COVID-19 Labs  No results for input(s): "DDIMER", "FERRITIN", "LDH", "CRP" in the last 72 hours.  Lab Results  Component Value Date   [place] NEGATIVE 01/26/2021    CBC: Recent Labs  Lab 07/03/22 0429 07/04/22 0427  WBC 9.5 7.4  HGB 10.7* 11.1*  HCT 34.1* 33.2*  MCV 92.4 89.0  PLT 122* 132*    Cardiac Enzymes: No results for input(s): "CKTOTAL", "CKMB", "CKMBINDEX", "TROPONINI" in  the last 168 hours. BNP (last 3 results) No results for input(s): "PROBNP" in the last 8760 hours. CBG: Recent Labs  Lab 07/08/22 1631 07/08/22 2017 07/09/22 0817 07/09/22 1142 07/09/22 1624  GLUCAP 105* 124* 117* 113* 124*    D-Dimer: No results for input(s): "DDIMER" in the last 72 hours. Hgb A1c: No results for input(s): "HGBA1C" in the last 72 hours.  Lipid Profile: No results for input(s): "CHOL", "HDL", "LDLCALC", "TRIG", "CHOLHDL", "LDLDIRECT" in the last 72 hours. Thyroid function studies: No results for input(s): "TSH", "T4TOTAL", "T3FREE", "THYROIDAB" in the last 72 hours.  Invalid input(s): "FREET3" Anemia work up: No results for input(s): "VITAMINB12", "FOLATE", "FERRITIN", "TIBC", "IRON", "RETICCTPCT"  in the last 72 hours. Sepsis Labs: Recent Labs  Lab 07/03/22 0429 07/04/22 0427  WBC 9.5 7.4    Microbiology Recent Results (from the past 240 hour(s))  Surgical pcr screen     Status: None   Collection Time: 07/02/22  2:07 AM  Result Value Ref Range Status   MRSA, PCR NEGATIVE NEGATIVE Final   Staphylococcus aureus NEGATIVE NEGATIVE Final    Comment: (NOTE) The Xpert SA Assay (FDA approved for NASAL specimens in patients 89 years of age and older), is one component of a comprehensive surveillance program. It is not intended to diagnose infection nor to guide or monitor treatment. Performed at [facility], [place] [address], [place], [place] [address]   Surgical pcr screen     Status: None   Collection Time: 07/02/22 12:07 PM   Specimen: Nasal Mucosa; Nasal Swab  Result Value Ref Range Status   MRSA, PCR NEGATIVE NEGATIVE Final   Staphylococcus aureus NEGATIVE NEGATIVE Final    Comment: (NOTE) The Xpert SA Assay (FDA approved for NASAL specimens in patients 104 years of age and older), is one component of a comprehensive surveillance program. It is not intended to diagnose infection nor to guide or monitor treatment. Performed at [facility], [place] [address] [place],  [address]      Medications:    atorvastatin  40 mg Oral QHS   citalopram  10 mg Oral Daily   divalproex  125 mg Oral 2 times per day   divalproex  250 mg Oral Q24H   docusate sodium  100 mg Oral BID   dronabinol  2.5 mg Oral BID AC   enoxaparin (LOVENOX) injection  40 mg Subcutaneous Q24H   famotidine  10 mg Oral BID   insulin aspart  0-15 Units Subcutaneous TID WC   insulin aspart  0-5 Units Subcutaneous QHS   mirtazapine  30 mg Oral QHS   montelukast  10 mg Oral QHS   Continuous Infusions:  sodium chloride 10 mL/hr at 07/03/22 0942   methocarbamol (ROBAXIN) IV        LOS: 8 days   Time spent: 35 minutes.  Bonnell Public, MD.  [facility]  07/09/2022, 5:11  PM

## 2022-07-09 NOTE — Plan of Care (Signed)
  Problem: Fluid Volume: Goal: Ability to maintain a balanced intake and output will improve Outcome: Not Progressing   Problem: Metabolic: Goal: Ability to maintain appropriate glucose levels will improve Outcome: Progressing   Problem: Nutritional: Goal: Maintenance of adequate nutrition will improve Outcome: Not Progressing   Problem: Skin Integrity: Goal: Risk for impaired skin integrity will decrease Outcome: Progressing   Problem: Tissue Perfusion: Goal: Adequacy of tissue perfusion will improve Outcome: Progressing   Problem: Clinical Measurements: Goal: Will remain free from infection Outcome: Completed/Met Goal: Respiratory complications will improve Outcome: Completed/Met Goal: Cardiovascular complication will be avoided Outcome: Completed/Met   Problem: Activity: Goal: Risk for activity intolerance will decrease Outcome: Not Progressing   Problem: Nutrition: Goal: Adequate nutrition will be maintained Outcome: Not Progressing   Problem: Coping: Goal: Level of anxiety will decrease Outcome: Completed/Met   Problem: Elimination: Goal: Will not experience complications related to urinary retention Outcome: Completed/Met

## 2022-07-10 DIAGNOSIS — S72001D Fracture of unspecified part of neck of right femur, subsequent encounter for closed fracture with routine healing: Secondary | ICD-10-CM | POA: Diagnosis not present

## 2022-07-10 LAB — GLUCOSE, CAPILLARY
Glucose-Capillary: 122 mg/dL — ABNORMAL HIGH (ref 70–99)
Glucose-Capillary: 86 mg/dL (ref 70–99)
Glucose-Capillary: 134 mg/dL — ABNORMAL HIGH (ref 70–99)
Glucose-Capillary: 82 mg/dL (ref 70–99)
Glucose-Capillary: 83 mg/dL (ref 70–99)
Glucose-Capillary: 87 mg/dL (ref 70–99)
Glucose-Capillary: 80 mg/dL (ref 70–99)

## 2022-07-10 MED ORDER — DRONABINOL 2.5 MG PO CAPS: 2.5000 mg | ORAL_CAPSULE | Freq: Two times a day (BID) | ORAL | 0 refills | Status: DC

## 2022-07-10 MED ORDER — DEXTROSE 50 % IV SOLN
INTRAVENOUS | Status: AC
  Filled 2022-07-10: qty 50

## 2022-07-10 NOTE — Progress Notes (Signed)
Physical Therapy Treatment Patient Details Name: [name] MRN: [id number] DOB: 06/20/1951 Today's Date: 07/10/2022   History of Present Illness Pt is a 71 y/o female who presents s/p fall at her memory unit and was admitted on 11/4 with a R intertrochanteric fracture. She is now s/p IM nailing on 11/5 and is WBAT.  PMHx:  DM with hyperglycemia, HLD, GERD, HTN,    PT Comments    Initially pt pleasant and eager to participate with PT. Upon sitting EOB, noted sudden decrease in responsiveness and pt keeping eyes closed. BP was checked and pt was orthostatic. See below for details. Nursing staff notified. Mobility and session overall limited by orthostatic hypotension. SNF level rehab remains the most appropriate d/c disposition. Will continue to follow and progress as able per POC.   Orthostatic BPs  Sitting 89/58   Supine after return from EOB (HOB 30) 85/75  Supine with HOB 15 93/67      Recommendations for follow up therapy are one component of a multi-disciplinary discharge planning process, led by the attending physician.  Recommendations may be updated based on patient status, additional functional criteria and insurance authorization.  Follow Up Recommendations  Skilled nursing-short term rehab (<3 hours/day) Can patient physically be transported by private vehicle: No   Assistance Recommended at Discharge Frequent or constant Supervision/Assistance  Patient can return home with the following Two people to help with walking and/or transfers;A lot of help with bathing/dressing/bathroom;Direct supervision/assist for medications management;Direct supervision/assist for financial management;Assist for transportation   Equipment Recommendations  Other (comment) (TBD by next venue of care)    Recommendations for Other Services       Precautions / Restrictions Precautions Precautions: Fall Precaution Comments: orthostatic hypotension Restrictions Weight Bearing  Restrictions: No RLE Weight Bearing: Weight bearing as tolerated     Mobility  Bed Mobility Overal bed mobility: Needs Assistance Bed Mobility: Supine to Sit, Sit to Supine     Supine to sit: Max assist, +2 for physical assistance, HOB elevated Sit to supine: Max assist, +2 for physical assistance   General bed mobility comments: Pt agreeable and saying "yes, ok" throughout instruction, but not following commands. With transition to sitting EOB, pt holding eyes closed and with decreased responsiveness. Increased time taken to try and engage pt. BP taken and was 89/58. Pt returned to supine with HOB slowly raised to 30 - BP during this time 85/75. HOB lowered to 15 as BP not increasing with time and was 93/67 at end of session. Nursing staff notified.    Transfers                   General transfer comment: Not attempted due to orthostatic hypotension.    Ambulation/Gait               General Gait Details: unable to attempt   Stairs             Wheelchair Mobility    Modified Rankin (Stroke Patients Only)       Balance Overall balance assessment: Needs assistance, History of Falls Sitting-balance support: Feet supported, Single extremity supported Sitting balance-Leahy Scale: Poor Sitting balance - Comments: Poor initially, then zero as requiring up to max assist. Postural control: Posterior lean, Right lateral lean                                  Cognition Arousal/Alertness: Awake/alert Behavior During Therapy:  Flat affect, Anxious Overall Cognitive Status: History of cognitive impairments - at baseline                                 General Comments: from memory care - likely near baseline        Exercises      General Comments        Pertinent Vitals/Pain Pain Assessment Pain Assessment: Faces Faces Pain Scale: Hurts a little bit Breathing: normal Negative Vocalization: none Facial Expression:  smiling or inexpressive Body Language: relaxed Consolability: no need to console PAINAD Score: 0 Pain Location: Generalized with movement Pain Descriptors / Indicators: Grimacing, Guarding Pain Intervention(s): Limited activity within patient's tolerance, Monitored during session, Repositioned    Home Living                          Prior Function            PT Goals (current goals can now be found in the care plan section) Acute Rehab PT Goals Patient Stated Goal: None stated PT Goal Formulation: Patient unable to participate in goal setting Time For Goal Achievement: 07/17/22 Potential to Achieve Goals: Good Progress towards PT goals: Progressing toward goals    Frequency    Min 3X/week      PT Plan Current plan remains appropriate    Co-evaluation              AM-PAC PT "6 Clicks" Mobility   Outcome Measure  Help needed turning from your back to your side while in a flat bed without using bedrails?: A Lot Help needed moving from lying on your back to sitting on the side of a flat bed without using bedrails?: Total Help needed moving to and from a bed to a chair (including a wheelchair)?: Total Help needed standing up from a chair using your arms (e.g., wheelchair or bedside chair)?: Total Help needed to walk in hospital room?: Total Help needed climbing 3-5 steps with a railing? : Total 6 Click Score: 7    End of Session Equipment Utilized During Treatment: Gait belt Activity Tolerance: Treatment limited secondary to medical complications (Comment) (Orthostatic hypotension) Patient left: in bed;with call bell/phone within reach;with bed alarm set Nurse Communication: Mobility status (BP status) PT Visit Diagnosis: Pain;Muscle weakness (generalized) (M62.81);History of falling (Z91.81);Difficulty in walking, not elsewhere classified (R26.2) Pain - Right/Left: Right Pain - part of body: Hip     Time: 6734-1937 PT Time Calculation (min) (ACUTE  ONLY): 18 min  Charges:  $Therapeutic Activity: 8-22 mins                     Rolinda Roan, PT, DPT Acute Rehabilitation Services Secure Chat Preferred Office: [phone]    [name] 07/10/2022, 11:26 AM

## 2022-07-10 NOTE — Discharge Summary (Signed)
Physician Discharge Summary   Patient: [name] MRN: [id number] DOB: July 06, 1951  Admit date:     07/01/2022  Discharge date: 07/10/22  Discharge Physician: Berle Mull  PCP: Caryl Bis, MD  Recommendations at discharge:  Follow up with PCP as recommended and Orthopedics as recommended  Recommendation is to establish care with Palliative care for [place] discussion   Follow-up Information     Haddix, Thomasene Lot, MD. Schedule an appointment as soon as possible for a visit in 2 week(s).   Specialty: Orthopedic Surgery Why: for wound check and repeat x-rays Contact information: [place] [address] [phone]         Caryl Bis, MD Follow up.   Specialty: Family Medicine Contact information: [place] [address] [phone]         Palliative care. Schedule an appointment as soon as possible for a visit.   Why: establish care to continue discussion about goals of care               Discharge Diagnoses: Principal Problem:   Closed right hip fracture ([place]) Active Problems:   Hyperlipidemia   GERD (gastroesophageal reflux disease)   Hypertension   Diabetes mellitus ([place])   Dementia without behavioral disturbance ([place])   Assessment and Plan  71 y.o. female past medical history significant for severe dementia, diabetes mellitus type 2 hyperlipidemia resides at a memory care unit facility had a fall started complaining of right hip pain, x-ray and CT of her hip show impacted right femoral neck fracture, with anterior relation orthopedic surgery was consulted, underwent ORIF on 11/05. Hb remained stable post op.  Pt was seen by Palliative care for goals of care conversation. Pt remained medically stable for discharge from hospital with the current goal of care.  Closed right hip fracture With bleeding surgery was consulted recommended intramedullary nailing of the right intertrochanteric femur. She was continued on Norco and  aspirin for DVT prophylaxis. Physical therapy evaluated the patient, she will need to go to skilled nursing facility.   Diabetes mellitus type 2 with hyperglycemia: On no oral hypoglycemic agents at home her A1c is 5.5 blood glucose remain relatively stable in house.   Acute kidney injury: Creat Baseline 0.5. on admission 1.1 Likely prerenal azotemia resolved with IV fluid resuscitation.   Hyperlipidemia: Continue statins.   GERD: Continue PPI.   Elevated blood pressure without a diagnosis of hypertension: Likely due to pain once her pain was controlled her blood pressure improved.   Advanced dementia without behavioral disturbances; Pt with poor PO intake  Palliative care was consulted  Started on marinol. At her baseline she need a lot of assistance for feeding reportedly  Family want to continue current care plan.  Will recommend the pt to get back to her familiar environment with marinol to see if that changes her intake pattern. Otherwise recommend family to consider further discussion with family with regards to goals of care.  code status change to DNR    Pain control - [facility] Controlled Substance Reporting System database was reviewed. and patient was instructed, not to drive, operate heavy machinery, perform activities at heights, swimming or participation in water activities or provide baby-sitting services while on Pain, Sleep and Anxiety Medications; until their outpatient Physician has advised to do so again. Also recommended to not to take more than prescribed Pain, Sleep and Anxiety Medications.  Consultants:  Palliative care  Orthopedics   Procedures performed:  -  Cephalomedullary nailing of right intertrochanteric femur fracture   DISCHARGE MEDICATION: Allergies as of 07/10/2022       Reactions   Flomax [tamsulosin Hcl] Swelling   JAWS   Sulfa Antibiotics Swelling        Medication List     STOP taking these medications    cefdinir 300 MG  capsule Commonly known as: OMNICEF       TAKE these medications    acetaminophen 325 MG tablet Commonly known as: TYLENOL Take 1-2 tablets (325-650 mg total) by mouth every 6 (six) hours as needed for mild pain or moderate pain.   aspirin EC 325 MG tablet Take 1 tablet (325 mg total) by mouth daily.   atorvastatin 20 MG tablet Commonly known as: LIPITOR Take 40 mg by mouth at bedtime.   cholecalciferol 25 MCG (1000 UNIT) tablet Commonly known as: VITAMIN D3 Take 2,000 Units by mouth daily.   cyanocobalamin 500 MCG tablet Commonly known as: VITAMIN B12 Take 500 mcg by mouth daily.   divalproex 125 MG DR tablet Commonly known as: DEPAKOTE Take 125-250 mg by mouth 3 (three) times daily. 1 tablet at 0800 and 1400, 2 tablets at bedtime (1900)   docusate sodium 100 MG capsule Commonly known as: COLACE Take 100 mg by mouth 2 (two) times daily.   dronabinol 2.5 MG capsule Commonly known as: MARINOL Take 1 capsule (2.5 mg total) by mouth 2 (two) times daily before lunch and supper.   famotidine 10 MG tablet Commonly known as: PEPCID Take 10 mg by mouth 2 (two) times daily.   fluticasone 50 MCG/ACT nasal spray Commonly known as: FLONASE Place 2 sprays into both nostrils daily.   HYDROcodone-acetaminophen 5-325 MG tablet Commonly known as: NORCO/VICODIN Take 1 tablet by mouth every 6 (six) hours as needed for severe pain.   mirtazapine 30 MG tablet Commonly known as: REMERON Take 30 mg by mouth at bedtime.   montelukast 10 MG tablet Commonly known as: SINGULAIR Take 10 mg by mouth at bedtime.   ondansetron 4 MG disintegrating tablet Commonly known as: ZOFRAN-ODT Take 4 mg by mouth every 8 (eight) hours as needed.       Disposition: ALF/ILF Diet recommendation: Regular diet  Discharge Exam: Vitals:   07/09/22 1438 07/09/22 1945 07/10/22 0503 07/10/22 0812  BP: 128/81 119/87 103/67 102/73  Pulse: 99 (!) 101 98 (!) 53  Resp: '18 16 16 18  '$ Temp:  98 F (36.7  C) 98 F (36.7 C) 98 F (36.7 C)  TempSrc:  Oral Oral   SpO2: 98% 98% 100% (!) 78%  Weight:      Height:       General: Appear in no distress; no visible Abnormal Neck Mass Or lumps, Conjunctiva normal Cardiovascular: S1 and S2 Present, no Murmur, Respiratory: good respiratory effort, Bilateral Air entry present and CTA, no Crackles, no wheezes Abdomen: Bowel Sound present, Non tender  Extremities: no Pedal edema Neurology: alert and not oriented to time, place, and person, no focal deficit unable to follow  commands at baseline   Filed Weights   07/02/22 0237 07/02/22 1215  Weight: 50.9 kg 50.9 kg   Condition at discharge: stable  The results of significant diagnostics from this hospitalization (including imaging, microbiology, ancillary and laboratory) are listed below for reference.   Imaging Studies: DG FEMUR PORT, MIN 2 VIEWS RIGHT  Result Date: 07/02/2022 CLINICAL DATA:  Postop. EXAM: RIGHT FEMUR PORTABLE 2 VIEW COMPARISON:  Preoperative imaging. FINDINGS: Intramedullary nail with distal locking  and trans trochanteric screw fixation of proximal femur fracture. Recent postsurgical change includes air and edema in the joint space and soft tissues. Distal femur is intact. IMPRESSION: ORIF of proximal femur fracture. No immediate postoperative complication. Electronically Signed   By: Keith Rake M.D.   On: 07/02/2022 19:50   DG FEMUR, MIN 2 VIEWS RIGHT  Result Date: 07/02/2022 CLINICAL DATA:  Elective surgery. EXAM: RIGHT FEMUR 2 VIEWS COMPARISON:  Preoperative imaging. FINDINGS: Five fluoroscopic spot views of the right hip obtained in the operating room. Intramedullary nail with distal locking and trans trochanteric screw fixation no proximal femur fracture. Fluoroscopy time 54.3 seconds. Dose 5.71 mGy. IMPRESSION: Intraoperative fluoroscopy during right femoral intramedullary nail and trans trochanteric screw fixation. Electronically Signed   By: Keith Rake M.D.   On:  07/02/2022 14:50   DG C-Arm 1-60 Min-No Report  Result Date: 07/02/2022 Fluoroscopy was utilized by the requesting physician.  No radiographic interpretation.   CT HIP RIGHT WO CONTRAST  Result Date: 07/01/2022 CLINICAL DATA:  Hip surgical planning. Femoral neck fracture on radiograph EXAM: CT OF THE RIGHT HIP WITHOUT CONTRAST TECHNIQUE: Multidetector CT imaging of the right hip was performed according to the standard protocol. Multiplanar CT image reconstructions were also generated. RADIATION DOSE REDUCTION: This exam was performed according to the departmental dose-optimization program which includes automated exposure control, adjustment of the mA and/or kV according to patient size and/or use of iterative reconstruction technique. COMPARISON:  Radiograph earlier today. FINDINGS: Bones/Joint/Cartilage Impacted right basicervical femoral neck fracture with mild apex anterior angulation. There is mild displacement. Femoral head is seated in the acetabulum. Mild osteoarthritis of the right hip. Pubic rami and included hemipelvis are intact. Ligaments Suboptimally assessed by CT. Muscles and Tendons Mild edema/hemorrhage in the obturator musculature. Soft tissues Subcutaneous edema laterally.  There is no intrapelvic free fluid. IMPRESSION: Impacted right basicervical femoral neck fracture with mild apex anterior angulation and mild displacement. Electronically Signed   By: Keith Rake M.D.   On: 07/01/2022 18:48   DG Hip Unilat W or Wo Pelvis 2-3 Views Right  Result Date: 07/01/2022 CLINICAL DATA:  Right-sided hip and thigh pain after fall. EXAM: DG HIP (WITH OR WITHOUT PELVIS) 2-3V RIGHT COMPARISON:  None. FINDINGS: Although limited by positioning, there is an impacted right femoral neck fracture. Mild rotation of the femur. The femoral head is seated. The bones are diffusely under mineralized. Mild bilateral hip degenerative change. Intact pubic rami. IMPRESSION: Impacted right femoral neck  fracture. Electronically Signed   By: Keith Rake M.D.   On: 07/01/2022 15:45   CT Cervical Spine Wo Contrast  Result Date: 07/01/2022 CLINICAL DATA:  Neck trauma (Age >= 65y) EXAM: CT CERVICAL SPINE WITHOUT CONTRAST TECHNIQUE: Multidetector CT imaging of the cervical spine was performed without intravenous contrast. Multiplanar CT image reconstructions were also generated. RADIATION DOSE REDUCTION: This exam was performed according to the departmental dose-optimization program which includes automated exposure control, adjustment of the mA and/or kV according to patient size and/or use of iterative reconstruction technique. COMPARISON:  Cervical spine CT 05/14/2022 FINDINGS: Alignment: No traumatic subluxation. Similar reversal of normal lordosis. Skull base and vertebrae: No acute fracture. Vertebral body heights are maintained. The dens and skull base are intact. Soft tissues and spinal canal: No prevertebral fluid or swelling. No visible canal hematoma. Disc levels: Degenerative disc disease from C3-C4 through C6-C7, stable. Upper chest: No acute or unexpected findings. Other: None. IMPRESSION: Stable degenerative change in the cervical spine without acute fracture or  subluxation. Electronically Signed   By: Keith Rake M.D.   On: 07/01/2022 15:41   CT Head Wo Contrast  Result Date: 07/01/2022 CLINICAL DATA:  Post fall. EXAM: CT HEAD WITHOUT CONTRAST TECHNIQUE: Contiguous axial images were obtained from the base of the skull through the vertex without intravenous contrast. RADIATION DOSE REDUCTION: This exam was performed according to the departmental dose-optimization program which includes automated exposure control, adjustment of the mA and/or kV according to patient size and/or use of iterative reconstruction technique. COMPARISON:  05/14/2022 FINDINGS: Brain: No acute intracranial hemorrhage. No subdural or extra-axial collection. Stable degree of atrophy and chronic small vessel ischemia.  No evidence of acute infarct. No midline shift, mass effect or hydrocephalus. Vascular: Atherosclerosis of skullbase vasculature without hyperdense vessel or abnormal calcification. Skull: No fracture or focal lesion. Sinuses/Orbits: Paranasal sinuses and mastoid air cells are clear. The visualized orbits are unremarkable. Other: No confluent scalp hematoma. IMPRESSION: 1. No acute intracranial abnormality. No skull fracture. 2. Stable atrophy and chronic small vessel ischemia. Electronically Signed   By: Keith Rake M.D.   On: 07/01/2022 15:38    Microbiology: Results for orders placed or performed during the hospital encounter of 07/01/22  Surgical pcr screen     Status: None   Collection Time: 07/02/22  2:07 AM  Result Value Ref Range Status   MRSA, PCR NEGATIVE NEGATIVE Final   Staphylococcus aureus NEGATIVE NEGATIVE Final    Comment: (NOTE) The Xpert SA Assay (FDA approved for NASAL specimens in patients 85 years of age and older), is one component of a comprehensive surveillance program. It is not intended to diagnose infection nor to guide or monitor treatment. Performed at [facility], [place] [address] [place], [place] [address]   Surgical pcr screen     Status: None   Collection Time: 07/02/22 12:07 PM   Specimen: Nasal Mucosa; Nasal Swab  Result Value Ref Range Status   MRSA, PCR NEGATIVE NEGATIVE Final   Staphylococcus aureus NEGATIVE NEGATIVE Final    Comment: (NOTE) The Xpert SA Assay (FDA approved for NASAL specimens in patients 11 years of age and older), is one component of a comprehensive surveillance program. It is not intended to diagnose infection nor to guide or monitor treatment. Performed at [facility], [place] [address], [place], [place] [address]    Labs: CBC: Recent Labs  Lab 07/04/22 0427  WBC 7.4  HGB 11.1*  HCT 33.2*  MCV 89.0  PLT 759*   Basic Metabolic Panel: Recent Labs  Lab 07/09/22 0636  CREATININE 0.84   Liver  Function Tests: No results for input(s): "AST", "ALT", "ALKPHOS", "BILITOT", "PROT", "ALBUMIN" in the last 168 hours. CBG: Recent Labs  Lab 07/09/22 0817 07/09/22 1142 07/09/22 1624 07/09/22 2021 07/10/22 0814  GLUCAP 117* 113* 124* 111* 122*    Discharge time spent: greater than 30 minutes.  Signed: Berle Mull, MD [facility]

## 2022-07-10 NOTE — Plan of Care (Signed)
  Problem: Coping: Goal: Ability to adjust to condition or change in health will improve Outcome: Progressing   Problem: Fluid Volume: Goal: Ability to maintain a balanced intake and output will improve Outcome: Progressing   Problem: Health Behavior/Discharge Planning: Goal: Ability to manage health-related needs will improve Outcome: Progressing   Problem: Metabolic: Goal: Ability to maintain appropriate glucose levels will improve Outcome: Progressing   Problem: Tissue Perfusion: Goal: Adequacy of tissue perfusion will improve Outcome: Progressing

## 2022-07-10 NOTE — TOC Progression Note (Addendum)
Transition of Care [facility]) - Progression Note    Patient Details  Name: CARLIE SOLORZANO MRN: [id number] Date of Birth: [birth year]  [facility]) CM/SW Contact  Joanne Chars, LCSW Phone Number: 07/10/2022, 10:21 AM  Clinical Narrative:   CSW informed by MD that peer to peer completed and denied for SNF auth.  1015: CSW spoke with [facility], cell: [phone], fax [phone] regarding pt return to memory care.  She requested DC summary and other clinical be faxed and she will review, will need to do a face to face eval a well.  Info faxed.   1320: TC [facility].  They cannot meet pt needs currently, feel like pt needs to go to SNF.    Expected Discharge Plan: [place] Barriers to Discharge: Continued Medical Work up, SNF Pending bed offer  Expected Discharge Plan and Services Expected Discharge Plan: [place] In-house Referral: Clinical Social Work   Post Acute Care Choice: [place] arrangements for the past 2 months: [place] ([facility]-memory care) Expected Discharge Date: 07/06/22                                     Social Determinants of Health (SDOH) Interventions    Readmission Risk Interventions     No data to display

## 2022-07-11 DIAGNOSIS — S72001D Fracture of unspecified part of neck of right femur, subsequent encounter for closed fracture with routine healing: Secondary | ICD-10-CM | POA: Diagnosis not present

## 2022-07-11 LAB — GLUCOSE, CAPILLARY
Glucose-Capillary: 107 mg/dL — ABNORMAL HIGH (ref 70–99)
Glucose-Capillary: 122 mg/dL — ABNORMAL HIGH (ref 70–99)
Glucose-Capillary: 116 mg/dL — ABNORMAL HIGH (ref 70–99)
Glucose-Capillary: 161 mg/dL — ABNORMAL HIGH (ref 70–99)
Glucose-Capillary: 81 mg/dL (ref 70–99)

## 2022-07-11 NOTE — TOC Transition Note (Signed)
Transition of Care [facility]) - CM/SW Discharge Note   Patient Details  Name: TANYIA GRABBE MRN: [id number] Date of Birth: 1950/10/01  [facility] of [facility]) CM/SW Contact:  Joanne Chars, LCSW Phone Number: 07/11/2022, 2:43 PM   Clinical Narrative:  Pt discharging to [facility].  RN call report to  [phone].    Final next level of care: Skilled Nursing Facility Barriers to Discharge: Barriers Resolved   Patient Goals and CMS Choice Patient states their goals for this hospitalization and ongoing recovery are:: To return to memory care   Choice offered to / list presented to : Sibling (sister [name], who reports she is POA)  Discharge Placement              [facility] at:  [facility]) Patient to be transferred to facility by: ptar Name of family member notified: SIL Wayne Patient and family notified of of transfer: 07/11/22  Discharge Plan and Services In-house Referral: Clinical Social Work   Post Acute Care Choice: [place]                               Social Determinants of Health (SDOH) Interventions     Readmission Risk Interventions     No data to display

## 2022-07-11 NOTE — Discharge Summary (Signed)
Physician Discharge Summary   Patient: [name] MRN: [id number] DOB: 1951-01-14  Admit date:     07/01/2022  Discharge date: 07/11/22  Discharge Physician: Berle Mull  PCP: Caryl Bis, MD  Recommendations at discharge: Follow up with PCP as recommended and Orthopedics as recommended  Recommendation is to establish care with Palliative care for [place] discussion   Follow-up Information     Haddix, Thomasene Lot, MD. Schedule an appointment as soon as possible for a visit in 2 week(s).   Specialty: Orthopedic Surgery Why: for wound check and repeat x-rays Contact information: [place] [address] [phone]         Caryl Bis, MD Follow up.   Specialty: Family Medicine Contact information: [place] [address] [phone]         Palliative care. Schedule an appointment as soon as possible for a visit.   Why: establish care to continue discussion about goals of care               Discharge Diagnoses: Principal Problem:   Closed right hip fracture ([place]) Active Problems:   Hyperlipidemia   GERD (gastroesophageal reflux disease)   Hypertension   Diabetes mellitus ([place])   Dementia without behavioral disturbance ([place])  Assessment and Plan  71 y.o. female past medical history significant for severe dementia, diabetes mellitus type 2 hyperlipidemia resides at a memory care unit facility had a fall started complaining of right hip pain, x-ray and CT of her hip show impacted right femoral neck fracture, with anterior relation orthopedic surgery was consulted, underwent ORIF on 11/05. Hb remained stable post op.  Pt was seen by Palliative care for goals of care conversation. Pt remained medically stable for discharge from hospital with the current goal of care.  Closed right hip fracture With bleeding surgery was consulted recommended intramedullary nailing of the right intertrochanteric femur. She was continued on Norco and  aspirin for DVT prophylaxis. Physical therapy evaluated the patient, she will need to go to skilled nursing facility.   Diabetes mellitus type 2 with hyperglycemia: On no oral hypoglycemic agents at home her A1c is 5.5 blood glucose remain relatively stable in house.   Acute kidney injury: Creat Baseline 0.5. on admission 1.1 Likely prerenal azotemia resolved with IV fluid resuscitation.   Hyperlipidemia: Continue statins.   GERD: Continue PPI.   Elevated blood pressure without a diagnosis of hypertension Soft blood pressure Elevated BP Likely due to pain once her pain was controlled her blood pressure improved.  Patient does not have orthostatic drop in her blood pressure. Blood pressure of 90s over 60s as expected given her stature.   Advanced dementia without behavioral disturbances; Pt with poor PO intake  Palliative care was consulted  Started on marinol. At her baseline she need a lot of assistance for feeding reportedly  Family want to continue current care plan.  Will recommend the pt to get back to her familiar environment with marinol to see if that changes her intake pattern. Otherwise recommend family to consider further discussion with family with regards to goals of care.  CBG 122 on 11/14.  Serum creatinine 0.8 on 11/12.  code status change to DNR   Pain control - [facility] Controlled Substance Reporting System database was reviewed. and patient was instructed, not to drive, operate heavy machinery, perform activities at heights, swimming or participation in water activities or provide baby-sitting services while on Pain, Sleep and Anxiety Medications; until  their outpatient Physician has advised to do so again. Also recommended to not to take more than prescribed Pain, Sleep and Anxiety Medications.  Consultants:  Palliative care  Orthopedics   Procedures performed:  -Cephalomedullary nailing of right intertrochanteric femur fracture   DISCHARGE  MEDICATION: Allergies as of 07/11/2022       Reactions   Flomax [tamsulosin Hcl] Swelling   JAWS   Sulfa Antibiotics Swelling        Medication List     STOP taking these medications    cefdinir 300 MG capsule Commonly known as: OMNICEF       TAKE these medications    acetaminophen 325 MG tablet Commonly known as: TYLENOL Take 1-2 tablets (325-650 mg total) by mouth every 6 (six) hours as needed for mild pain or moderate pain.   aspirin EC 325 MG tablet Take 1 tablet (325 mg total) by mouth daily.   atorvastatin 20 MG tablet Commonly known as: LIPITOR Take 40 mg by mouth at bedtime.   cholecalciferol 25 MCG (1000 UNIT) tablet Commonly known as: VITAMIN D3 Take 2,000 Units by mouth daily.   cyanocobalamin 500 MCG tablet Commonly known as: VITAMIN B12 Take 500 mcg by mouth daily.   divalproex 125 MG DR tablet Commonly known as: DEPAKOTE Take 125-250 mg by mouth 3 (three) times daily. 1 tablet at 0800 and 1400, 2 tablets at bedtime (1900)   docusate sodium 100 MG capsule Commonly known as: COLACE Take 100 mg by mouth 2 (two) times daily.   dronabinol 2.5 MG capsule Commonly known as: MARINOL Take 1 capsule (2.5 mg total) by mouth 2 (two) times daily before lunch and supper.   famotidine 10 MG tablet Commonly known as: PEPCID Take 10 mg by mouth 2 (two) times daily.   fluticasone 50 MCG/ACT nasal spray Commonly known as: FLONASE Place 2 sprays into both nostrils daily.   HYDROcodone-acetaminophen 5-325 MG tablet Commonly known as: NORCO/VICODIN Take 1 tablet by mouth every 6 (six) hours as needed for severe pain.   mirtazapine 30 MG tablet Commonly known as: REMERON Take 30 mg by mouth at bedtime.   montelukast 10 MG tablet Commonly known as: SINGULAIR Take 10 mg by mouth at bedtime.   ondansetron 4 MG disintegrating tablet Commonly known as: ZOFRAN-ODT Take 4 mg by mouth every 8 (eight) hours as needed.       Disposition: ALF/ILF Diet  recommendation: Regular diet  Discharge Exam: Vitals:   07/10/22 1334 07/10/22 1939 07/11/22 0359 07/11/22 0751  BP: 103/77 106/74 102/66 105/74  Pulse: (!) 110 75 65 95  Resp: '18 16 16 16  '$ Temp: 98.1 F (36.7 C) 99 F (37.2 C)  98 F (36.7 C)  TempSrc:  Oral    SpO2: 100% 100% 91% 94%  Weight:      Height:       General: Appear in no distress; no visible Abnormal Neck Mass Or lumps, Conjunctiva normal Cardiovascular: S1 and S2 Present, no Murmur, Respiratory: good respiratory effort, Bilateral Air entry present and CTA, no Crackles, no wheezes Abdomen: Bowel Sound present, Non tender  Extremities: no Pedal edema Neurology: alert and not oriented to time, place, and person, no focal deficit unable to follow  commands at baseline   Filed Weights   07/02/22 0237 07/02/22 1215  Weight: 50.9 kg 50.9 kg   Condition at discharge: stable  The results of significant diagnostics from this hospitalization (including imaging, microbiology, ancillary and laboratory) are listed below for reference.  Imaging Studies: DG FEMUR PORT, MIN 2 VIEWS RIGHT  Result Date: 07/02/2022 CLINICAL DATA:  Postop. EXAM: RIGHT FEMUR PORTABLE 2 VIEW COMPARISON:  Preoperative imaging. FINDINGS: Intramedullary nail with distal locking and trans trochanteric screw fixation of proximal femur fracture. Recent postsurgical change includes air and edema in the joint space and soft tissues. Distal femur is intact. IMPRESSION: ORIF of proximal femur fracture. No immediate postoperative complication. Electronically Signed   By: Keith Rake M.D.   On: 07/02/2022 19:50   DG FEMUR, MIN 2 VIEWS RIGHT  Result Date: 07/02/2022 CLINICAL DATA:  Elective surgery. EXAM: RIGHT FEMUR 2 VIEWS COMPARISON:  Preoperative imaging. FINDINGS: Five fluoroscopic spot views of the right hip obtained in the operating room. Intramedullary nail with distal locking and trans trochanteric screw fixation no proximal femur fracture.  Fluoroscopy time 54.3 seconds. Dose 5.71 mGy. IMPRESSION: Intraoperative fluoroscopy during right femoral intramedullary nail and trans trochanteric screw fixation. Electronically Signed   By: Keith Rake M.D.   On: 07/02/2022 14:50   DG C-Arm 1-60 Min-No Report  Result Date: 07/02/2022 Fluoroscopy was utilized by the requesting physician.  No radiographic interpretation.   CT HIP RIGHT WO CONTRAST  Result Date: 07/01/2022 CLINICAL DATA:  Hip surgical planning. Femoral neck fracture on radiograph EXAM: CT OF THE RIGHT HIP WITHOUT CONTRAST TECHNIQUE: Multidetector CT imaging of the right hip was performed according to the standard protocol. Multiplanar CT image reconstructions were also generated. RADIATION DOSE REDUCTION: This exam was performed according to the departmental dose-optimization program which includes automated exposure control, adjustment of the mA and/or kV according to patient size and/or use of iterative reconstruction technique. COMPARISON:  Radiograph earlier today. FINDINGS: Bones/Joint/Cartilage Impacted right basicervical femoral neck fracture with mild apex anterior angulation. There is mild displacement. Femoral head is seated in the acetabulum. Mild osteoarthritis of the right hip. Pubic rami and included hemipelvis are intact. Ligaments Suboptimally assessed by CT. Muscles and Tendons Mild edema/hemorrhage in the obturator musculature. Soft tissues Subcutaneous edema laterally.  There is no intrapelvic free fluid. IMPRESSION: Impacted right basicervical femoral neck fracture with mild apex anterior angulation and mild displacement. Electronically Signed   By: Keith Rake M.D.   On: 07/01/2022 18:48   DG Hip Unilat W or Wo Pelvis 2-3 Views Right  Result Date: 07/01/2022 CLINICAL DATA:  Right-sided hip and thigh pain after fall. EXAM: DG HIP (WITH OR WITHOUT PELVIS) 2-3V RIGHT COMPARISON:  None. FINDINGS: Although limited by positioning, there is an impacted right  femoral neck fracture. Mild rotation of the femur. The femoral head is seated. The bones are diffusely under mineralized. Mild bilateral hip degenerative change. Intact pubic rami. IMPRESSION: Impacted right femoral neck fracture. Electronically Signed   By: Keith Rake M.D.   On: 07/01/2022 15:45   CT Cervical Spine Wo Contrast  Result Date: 07/01/2022 CLINICAL DATA:  Neck trauma (Age >= 65y) EXAM: CT CERVICAL SPINE WITHOUT CONTRAST TECHNIQUE: Multidetector CT imaging of the cervical spine was performed without intravenous contrast. Multiplanar CT image reconstructions were also generated. RADIATION DOSE REDUCTION: This exam was performed according to the departmental dose-optimization program which includes automated exposure control, adjustment of the mA and/or kV according to patient size and/or use of iterative reconstruction technique. COMPARISON:  Cervical spine CT 05/14/2022 FINDINGS: Alignment: No traumatic subluxation. Similar reversal of normal lordosis. Skull base and vertebrae: No acute fracture. Vertebral body heights are maintained. The dens and skull base are intact. Soft tissues and spinal canal: No prevertebral fluid or swelling. No visible  canal hematoma. Disc levels: Degenerative disc disease from C3-C4 through C6-C7, stable. Upper chest: No acute or unexpected findings. Other: None. IMPRESSION: Stable degenerative change in the cervical spine without acute fracture or subluxation. Electronically Signed   By: Keith Rake M.D.   On: 07/01/2022 15:41   CT Head Wo Contrast  Result Date: 07/01/2022 CLINICAL DATA:  Post fall. EXAM: CT HEAD WITHOUT CONTRAST TECHNIQUE: Contiguous axial images were obtained from the base of the skull through the vertex without intravenous contrast. RADIATION DOSE REDUCTION: This exam was performed according to the departmental dose-optimization program which includes automated exposure control, adjustment of the mA and/or kV according to patient size  and/or use of iterative reconstruction technique. COMPARISON:  05/14/2022 FINDINGS: Brain: No acute intracranial hemorrhage. No subdural or extra-axial collection. Stable degree of atrophy and chronic small vessel ischemia. No evidence of acute infarct. No midline shift, mass effect or hydrocephalus. Vascular: Atherosclerosis of skullbase vasculature without hyperdense vessel or abnormal calcification. Skull: No fracture or focal lesion. Sinuses/Orbits: Paranasal sinuses and mastoid air cells are clear. The visualized orbits are unremarkable. Other: No confluent scalp hematoma. IMPRESSION: 1. No acute intracranial abnormality. No skull fracture. 2. Stable atrophy and chronic small vessel ischemia. Electronically Signed   By: Keith Rake M.D.   On: 07/01/2022 15:38    Microbiology: Results for orders placed or performed during the hospital encounter of 07/01/22  Surgical pcr screen     Status: None   Collection Time: 07/02/22  2:07 AM  Result Value Ref Range Status   MRSA, PCR NEGATIVE NEGATIVE Final   Staphylococcus aureus NEGATIVE NEGATIVE Final    Comment: (NOTE) The Xpert SA Assay (FDA approved for NASAL specimens in patients 27 years of age and older), is one component of a comprehensive surveillance program. It is not intended to diagnose infection nor to guide or monitor treatment. Performed at [facility], [place] [address] [place], [place] [address]   Surgical pcr screen     Status: None   Collection Time: 07/02/22 12:07 PM   Specimen: Nasal Mucosa; Nasal Swab  Result Value Ref Range Status   MRSA, PCR NEGATIVE NEGATIVE Final   Staphylococcus aureus NEGATIVE NEGATIVE Final    Comment: (NOTE) The Xpert SA Assay (FDA approved for NASAL specimens in patients 84 years of age and older), is one component of a comprehensive surveillance program. It is not intended to diagnose infection nor to guide or monitor treatment. Performed at [facility], [place] [address],  [place], [place] [address]    Labs: CBC: No results for input(s): "WBC", "NEUTROABS", "HGB", "HCT", "MCV", "PLT" in the last 168 hours.  Basic Metabolic Panel: Recent Labs  Lab 07/09/22 0636  CREATININE 0.84    Liver Function Tests: No results for input(s): "AST", "ALT", "ALKPHOS", "BILITOT", "PROT", "ALBUMIN" in the last 168 hours. CBG: Recent Labs  Lab 07/10/22 2126 07/10/22 2142 07/11/22 0649 07/11/22 0745 07/11/22 1207  GLUCAP 87 80 107* 122* 116*     Discharge time spent: greater than 30 minutes.  Signed: Berle Mull, MD [facility]

## 2022-07-11 NOTE — TOC Progression Note (Signed)
Transition of Care [facility]) - Progression Note    Patient Details  Name: LOISANN ROACH MRN: [id number] Date of Birth: [birth year]  Transition of Care [facility]) CM/SW Contact  Joanne Chars, LCSW Phone Number: 07/11/2022, 10:38 AM  Clinical Narrative:   CSW spoke with [facility].  They can accept private pay.  Rates are approx 10K/month for semi private, 11k for private.    CSW spoke with pt sister [name], updated her on events from yesterday, discussed private pay option at [facility] rehab and they are able to do that.  CSW reached out to [facility] and she will contact family for arrangements.  CSW spoke with [facility].  For them to told the bed, the family would need to continue to pay.  They can DC the pt to go to [facility] and then reassess once she is doing better for a readmission.  Issue would be whether an open bed is available.  She will reach out to pt sister [name] to discuss.      Expected Discharge Plan: [place] Barriers to Discharge: Continued Medical Work up, SNF Pending bed offer  Expected Discharge Plan and Services Expected Discharge Plan: [place] In-house Referral: Clinical Social Work   Post Acute Care Choice: [place] arrangements for the past 2 months: [place] ([facility]-memory care) Expected Discharge Date: 07/06/22                                     Social Determinants of Health (SDOH) Interventions    Readmission Risk Interventions     No data to display

## 2022-07-11 NOTE — Progress Notes (Signed)
Occupational Therapy Treatment Patient Details Name: [name] MRN: [id number] DOB: 12-17-1950 Today's Date: 07/11/2022   History of present illness Pt is a 71 y/o female who presents s/p fall at her memory unit and was admitted on 11/4 with a R intertrochanteric fracture. She is now s/p IM nailing on 11/5 and is WBAT.  PMHx:  DM with hyperglycemia, HLD, GERD, HTN,   OT comments  Pt with minimal progress towards OT goals dt/ impaired cognition, unable to follow commands and pain w/ noted resistance to movement. Attempted to engage pt in precursor tasks to meals, self feeding and basic grooming tasks with Total A needed for all tasks. Noted insurance denial for SNF rehab, unsure of rehab potential based on cognition - may benefit from transition to LTC if unable to return to memory care facility.    Recommendations for follow up therapy are one component of a multi-disciplinary discharge planning process, led by the attending physician.  Recommendations may be updated based on patient status, additional functional criteria and insurance authorization.    Follow Up Recommendations  Long-term institutional care without follow-up therapy     Assistance Recommended at Discharge Frequent or constant Supervision/Assistance  Patient can return home with the following  Two people to help with walking and/or transfers;A lot of help with bathing/dressing/bathroom;Assistance with feeding;Assistance with cooking/housework;Direct supervision/assist for medications management;Direct supervision/assist for financial management;Assist for transportation;Help with stairs or ramp for entrance   Equipment Recommendations  Hospital bed    Recommendations for Other Services      Precautions / Restrictions Precautions Precautions: Fall Restrictions Weight Bearing Restrictions: Yes RLE Weight Bearing: Weight bearing as tolerated       Mobility Bed Mobility               General bed mobility  comments: Total A for repositioning in bed for lunch    Transfers                         Balance                                           ADL either performed or assessed with clinical judgement   ADL Overall ADL's : Needs assistance/impaired Eating/Feeding: Total assistance Eating/Feeding Details (indicate cue type and reason): refused all foods presented to mouth with spoon, placed cookie in hand though no initiation to attempt to eat. per nursing, pt also did not eat breakfast Grooming: Total assistance;Wash/dry face;Wash/dry hands Grooming Details (indicate cue type and reason): attempted use of hand sanitizer wipe on hands from lunch tray, washing face though no initiation despite multiple cues and resistant to hand over hand assist                               General ADL Comments: Total A for all ADLs bed level    Extremity/Trunk Assessment Upper Extremity Assessment Upper Extremity Assessment: Generalized weakness;Difficult to assess due to impaired cognition   Lower Extremity Assessment Lower Extremity Assessment: Defer to PT evaluation        Vision   Vision Assessment?: No apparent visual deficits   Perception     Praxis      Cognition Arousal/Alertness: Awake/alert Behavior During Therapy: Flat affect, Anxious Overall Cognitive Status: History of cognitive impairments - at baseline  General Comments: hx of advanced dementia, constant talking though nonsensical. does not follow commands despite various cues types        Exercises      Shoulder Instructions       General Comments      Pertinent Vitals/ Pain       Pain Assessment Pain Assessment: Faces Faces Pain Scale: Hurts little more Pain Location: LEs with repositioning in bed Pain Descriptors / Indicators: Grimacing, Guarding Pain Intervention(s): Monitored during session  Home Living                                           Prior Functioning/Environment              Frequency  Min 1X/week        Progress Toward Goals  OT Goals(current goals can now be found in the care plan section)  Progress towards OT goals: Not progressing toward goals - comment  Acute Rehab OT Goals OT Goal Formulation: Patient unable to participate in goal setting Time For Goal Achievement: 07/17/22 Potential to Achieve Goals: Fair ADL Goals Pt Will Perform Eating: with mod assist;sitting;bed level Pt Will Transfer to Toilet: with mod assist;squat pivot transfer Additional ADL Goal #1: Pt will perform bed mobility with moderate assistance in preparation for ADLs. Additional ADL Goal #2: Pt will demonstrate fair sitting balance x 10 minutes as a precursor to ADLs.  Plan Discharge plan needs to be updated;Frequency needs to be updated    Co-evaluation                 AM-PAC OT "6 Clicks" Daily Activity     Outcome Measure   Help from another person eating meals?: Total Help from another person taking care of personal grooming?: Total Help from another person toileting, which includes using toliet, bedpan, or urinal?: Total Help from another person bathing (including washing, rinsing, drying)?: Total Help from another person to put on and taking off regular upper body clothing?: Total Help from another person to put on and taking off regular lower body clothing?: Total 6 Click Score: 6    End of Session    OT Visit Diagnosis: Muscle weakness (generalized) (M62.81)   Activity Tolerance Other (comment) (limited  by cognition)   Patient Left in bed;with call bell/phone within reach;with bed alarm set   Nurse Communication Mobility status        Time: 2956-2130 OT Time Calculation (min): 11 min  Charges: OT General Charges $OT Visit: 1 Visit OT Treatments $Self Care/Home Management : 8-22 mins  [name], OTR/L Acute Rehab Services Office: [phone]    [name] 07/11/2022, 2:15 PM

## 2022-07-11 NOTE — Progress Notes (Addendum)
[facility] PROGRESS NOTE  Patient: [name] FRT:[id number]   PCP: Caryl Bis, MD DOB: 1951/03/06   DOA: 07/01/2022   DOS: 07/11/2022    Subjective: Unable to follow any commands.  Unable to answer questions appropriately.  Appears in good spirit.  Moving all extremities appropriately.  Engaging in conversation although incomprehensible.  Objective:  Vitals:   07/10/22 1334 07/10/22 1939 07/11/22 0359 07/11/22 0751  BP: 103/77 106/74 102/66 105/74  Pulse: (!) 110 75 65 95  Resp: '18 16 16 16  '$ Temp: 98.1 F (36.7 C) 99 F (37.2 C)  98 F (36.7 C)  TempSrc:  Oral    SpO2: 100% 100% 91% 94%  Weight:      Height:       S1-S2 present. Bowel sound present. Clear to auscultation. No edema in the lower extremities. Moving all extremities adequately.  Assessment and plan: Soft blood pressure. Patient does not have orthostatic drop in her blood pressure. Blood pressure of 90s over 60s as expected given her stature.  Poor p.o. intake. Remains at risk for readmission and for poor prognosis. Continue Marinol. Continue to encourage p.o. CBG 122 on 11/14.  Serum creatinine 0.8 on 11/12. We will check BMP and magnesium if remains in hospital tomorrow  Disposition. Currently remains medically stable for discharge. Insurance is denied SNF stay. [facility] currently is unable to take the patient with the patient is from. TOC assisting.  Goals of care conversation. Palliative care was consulted earlier.  Goal remains to continue to treat what is treatable within the limitations of the DNR.  Author: Berle Mull, MD [facility] 07/11/2022 10:22 AM   If 7PM-7AM, please contact night-coverage at [email or web address]

## 2022-07-11 NOTE — Progress Notes (Signed)
Per [employer], they cannot accept patient after 8pm and [facility] will not be here till after 8pm. I informed the doctor. [name] will accept patient tomorrow morning.

## 2022-07-12 DIAGNOSIS — S72001A Fracture of unspecified part of neck of right femur, initial encounter for closed fracture: Secondary | ICD-10-CM | POA: Diagnosis not present

## 2022-07-12 LAB — BASIC METABOLIC PANEL
Anion gap: 13 (ref 5–15)
BUN: 55 mg/dL — ABNORMAL HIGH (ref 8–23)
CO2: 29 mmol/L (ref 22–32)
Calcium: 9.8 mg/dL (ref 8.9–10.3)
Chloride: 114 mmol/L — ABNORMAL HIGH (ref 98–111)
Creatinine, Ser: 1.15 mg/dL — ABNORMAL HIGH (ref 0.44–1.00)
GFR, Estimated: 51 mL/min — ABNORMAL LOW (ref >60)
Glucose, Bld: 141 mg/dL — ABNORMAL HIGH (ref 70–99)
Potassium: 3.6 mmol/L (ref 3.5–5.1)
Sodium: 156 mmol/L — ABNORMAL HIGH (ref 135–145)

## 2022-07-12 LAB — GLUCOSE, CAPILLARY
Glucose-Capillary: 116 mg/dL — ABNORMAL HIGH (ref 70–99)
Glucose-Capillary: 117 mg/dL — ABNORMAL HIGH (ref 70–99)
Glucose-Capillary: 177 mg/dL — ABNORMAL HIGH (ref 70–99)
Glucose-Capillary: 154 mg/dL — ABNORMAL HIGH (ref 70–99)

## 2022-07-12 LAB — MAGNESIUM: Magnesium: 2.4 mg/dL (ref 1.7–2.4)

## 2022-07-12 MED ORDER — DEXTROSE 5 % IV SOLN: INTRAVENOUS | Status: DC

## 2022-07-12 NOTE — Progress Notes (Addendum)
PROGRESS NOTE    [name]  LPF:[id number] DOB: Aug 13, 1951 DOA: 07/01/2022 PCP: Caryl Bis, MD   Brief Narrative: 71 year old with past medical history significant for severe dementia, diabetes type 2, hyperlipidemia, resided at memory care unit facility, had a fall, complaining of right hip pain.  CT of pelvis showed impacted right femoral neck fracture.  Orthopedic was consulted.  Patient underwent ORIF on 11/5.  Hemoglobin remained stable postoperative. Patient with poor oral intake.  Palliative care was consulted for goals of care conversation.  Plan is to continue with medical management.  Patient was supposed to be discharged 11/14 but facility was unable to accept patient after 8 PM.  B-met  check on 11/15 show a sodium of 156.    Assessment & Plan:   Principal Problem:   Closed right hip fracture (HCC) Active Problems:   Hyperlipidemia   GERD (gastroesophageal reflux disease)   Hypertension   Diabetes mellitus ([place])   Dementia without behavioral disturbance ([place])  1-Hypernatremia;  In setting of poor oral intake.  Plan to start D 5 IV fluids.  Encourage oral intake.   2-Closed Right hip fracture: Mechanical fall.  Orthopedic was consulted.  Patient underwent intramedullary nailing of the right intertrochanteric femoral Continue with DVT prophylaxis.  Pain management.  Diabetes type 2 with hyperglycemia: Not on oral hypoglycemic agents at home.  A1c 5.5  AKI: Creatinine baseline 0.5 on admission 1.1 Received IV fluids  Hyperlipidemia; Continue with statins.   GERD; Continue with PPI  Elevation BP; resolved after pain controlled.    Advanced dementia without behavioral disturbances. Patient with poor oral intake.  She was started on Marinol.  Palliative care consulted.  Family wants to continue with current care plan.     Estimated body mass index is 20.52 kg/m as calculated from the following:   Height as of this encounter: 5' 2.01" (1.575 m).    Weight as of this encounter: 50.9 kg.   DVT prophylaxis: Lovenox Code Status: DNR Family Communication: Sister over phone.  Disposition Plan:  Status is: Inpatient Remains inpatient appropriate because: management of hypernatremia    Consultants:  Ortho palliative  Procedures:    Antimicrobials:    Subjective: Denies pain, pleasantly confuse  Objective: Vitals:   07/12/22 1144 07/12/22 1146 07/12/22 1152 07/12/22 1157  BP: 107/71 (!) 84/71 (!) 89/73 109/81  Pulse:      Resp:      Temp:      TempSrc:      SpO2:      Weight:      Height:        Intake/Output Summary (Last 24 hours) at 07/12/2022 1535 Last data filed at 07/12/2022 1504 Gross per 24 hour  Intake 199.18 ml  Output --  Net 199.18 ml   Filed Weights   07/02/22 0237 07/02/22 1215  Weight: 50.9 kg 50.9 kg    Examination:  General exam: Appears calm and comfortable  Respiratory system: Clear to auscultation. Respiratory effort normal. Cardiovascular system: S1 & S2 heard, RRR. No JVD, murmurs, rubs, gallops or clicks. No pedal edema. Gastrointestinal system: Abdomen is nondistended, soft and nontender. No organomegaly or masses felt. Normal bowel sounds heard. Central nervous system: Alert and oriented.  Extremities: Symmetric 5 x 5 power.    Data Reviewed: I have personally reviewed following labs and imaging studies  CBC: No results for input(s): "WBC", "NEUTROABS", "HGB", "HCT", "MCV", "PLT" in the last 168 hours. Basic Metabolic Panel: Recent Labs  Lab 07/09/22 0636  07/12/22 0814  NA  --  156*  K  --  3.6  CL  --  114*  CO2  --  29  GLUCOSE  --  141*  BUN  --  55*  CREATININE 0.84 1.15*  CALCIUM  --  9.8  MG  --  2.4   GFR: Estimated Creatinine Clearance: 36 mL/min (A) (by C-G formula based on SCr of 1.15 mg/dL (H)). Liver Function Tests: No results for input(s): "AST", "ALT", "ALKPHOS", "BILITOT", "PROT", "ALBUMIN" in the last 168 hours. No results for input(s): "LIPASE",  "AMYLASE" in the last 168 hours. No results for input(s): "AMMONIA" in the last 168 hours. Coagulation Profile: No results for input(s): "INR", "PROTIME" in the last 168 hours. Cardiac Enzymes: No results for input(s): "CKTOTAL", "CKMB", "CKMBINDEX", "TROPONINI" in the last 168 hours. BNP (last 3 results) No results for input(s): "PROBNP" in the last 8760 hours. HbA1C: No results for input(s): "HGBA1C" in the last 72 hours. CBG: Recent Labs  Lab 07/11/22 1207 07/11/22 1606 07/11/22 2023 07/12/22 0818 07/12/22 1203  GLUCAP 116* 161* 81 116* 117*   Lipid Profile: No results for input(s): "CHOL", "HDL", "LDLCALC", "TRIG", "CHOLHDL", "LDLDIRECT" in the last 72 hours. Thyroid Function Tests: No results for input(s): "TSH", "T4TOTAL", "FREET4", "T3FREE", "THYROIDAB" in the last 72 hours. Anemia Panel: No results for input(s): "VITAMINB12", "FOLATE", "FERRITIN", "TIBC", "IRON", "RETICCTPCT" in the last 72 hours. Sepsis Labs: No results for input(s): "PROCALCITON", "LATICACIDVEN" in the last 168 hours.  No results found for this or any previous visit (from the past 240 hour(s)).       Radiology Studies: No results found.      Scheduled Meds:  atorvastatin  40 mg Oral QHS   citalopram  10 mg Oral Daily   divalproex  125 mg Oral 2 times per day   divalproex  250 mg Oral Q24H   docusate sodium  100 mg Oral BID   dronabinol  2.5 mg Oral BID AC   enoxaparin (LOVENOX) injection  40 mg Subcutaneous Q24H   famotidine  10 mg Oral BID   insulin aspart  0-15 Units Subcutaneous TID WC   insulin aspart  0-5 Units Subcutaneous QHS   mirtazapine  30 mg Oral QHS   montelukast  10 mg Oral QHS   Continuous Infusions:  sodium chloride Stopped (07/10/22 0701)   dextrose 100 mL/hr at 07/12/22 1303     LOS: 11 days    Time spent: 35 minutes    Gage Treiber A Damere Brandenburg, MD [facility]   If 7PM-7AM, please contact night-coverage [email or web address]  07/12/2022, 3:35 PM

## 2022-07-12 NOTE — Care Management Important Message (Signed)
Important Message  Patient Details  Name: [name] MRN: [id number] Date of Birth: [birth year]   Medicare Important Message Given:  Yes     Hannah Beat 07/12/2022, 12:07 PM

## 2022-07-12 NOTE — TOC Transition Note (Addendum)
Transition of Care [facility]) - CM/SW Discharge Note   Patient Details  Name: [name] MRN: [id number] Date of Birth: 05-08-1951  [facility] of [facility]) CM/SW Contact:  Joanne Chars, LCSW Phone Number: 07/12/2022, 9:52 AM   Clinical Narrative:   Pt discharging to [facility] [address]  RN call report to [phone].   1055: DC cancelled, per MD.  Final next level of care: Skilled Nursing Facility Barriers to Discharge: Barriers Resolved   Patient Goals and CMS Choice Patient states their goals for this hospitalization and ongoing recovery are:: To return to memory care   Choice offered to / list presented to : Sibling ([name], who reports she is POA)  Discharge Placement              Patient chooses bed at:  [facility]) Patient to be transferred to facility by: [place] Name of family member notified: left message with [place] Patient and family notified of of transfer: 07/12/22  Discharge Plan and Services In-house Referral: Clinical Social Work   Post Acute Care Choice: [place]                               Social Determinants of Health (SDOH) Interventions     Readmission Risk Interventions     No data to display

## 2022-07-12 NOTE — Progress Notes (Signed)
Palliative: Chart reviewed. Plans for discharge to rehab today.  Family agrees to outpatient palliative support.  No inpatient palliative needs identified at this time.  Juel Burrow, DNP, AGNP-C Palliative Medicine Team Team Phone # [phone]  Pager # [phone]  NO CHARGE

## 2022-07-12 NOTE — TOC Progression Note (Addendum)
Transition of Care [facility]) - Progression Note    Patient Details  Name: [name] MRN: [id number] Date of Birth: [birth year]  Transition of [facility]) CM/SW Contact  Joanne Chars, LCSW Phone Number: 07/12/2022, 2:30 PM  Clinical Narrative:   Multiple attempts made to contact sister Ofilia Neas regarding palliative referral.  No answer, messages left.   1500: CSW spoke with pt sister [name] about referral for outpt palliative care.  She wants to think about this before a referral is made.   Expected Discharge Plan: [place] Barriers to Discharge: Barriers Resolved  Expected Discharge Plan and Services Expected Discharge Plan: [place] In-house Referral: Clinical Social Work   Post Acute Care Choice: [place] Living arrangements for the past 2 months: [place] ([facility]-memory care) Expected Discharge Date: 07/06/22                                     Social Determinants of Health (SDOH) Interventions    Readmission Risk Interventions     No data to display

## 2022-07-12 NOTE — Progress Notes (Signed)
Physical Therapy Treatment Patient Details Name: [name] MRN: [id number] DOB: 1951-06-01 Today's Date: 07/12/2022   History of Present Illness Pt is a 71 y/o female who presents s/p fall at her memory unit and was admitted on 11/4 with a R intertrochanteric fracture. She is now s/p IM nailing on 11/5 and is WBAT.  PMHx:  DM with hyperglycemia, HLD, GERD, HTN,    PT Comments    Pt progressing slowly towards physical therapy goals. Past sessions limited by orthostatic hypotension, so monitored in bed prior to attempting EOB activity. See below for details. Pt again with decreased alertness when BP dropped, so HOB returned to a semi-supine position and focus of session shifted to therapeutic exercise. Pt resisting some movement due to pain and cognition, but grossly allowed PROM. Pt consistently has not been following commands but will engage with tactile cues and hand over hand assist to guide pt. Will continue to follow and progress as able per POC.   Orthostatic BPs  Supine 107/71  Sitting HOB 64 84/71 symptomatic  Sitting after 5 min HOB 64 89/73 symptomatic  Supine HOB 45 109/81      Recommendations for follow up therapy are one component of a multi-disciplinary discharge planning process, led by the attending physician.  Recommendations may be updated based on patient status, additional functional criteria and insurance authorization.  Follow Up Recommendations  Skilled nursing-short term rehab (<3 hours/day) Can patient physically be transported by private vehicle: No   Assistance Recommended at Discharge Frequent or constant Supervision/Assistance  Patient can return home with the following Two people to help with walking and/or transfers;A lot of help with bathing/dressing/bathroom;Direct supervision/assist for medications management;Direct supervision/assist for financial management;Assist for transportation   Equipment Recommendations  Other (comment) (TBD by next  venue of care)    Recommendations for Other Services       Precautions / Restrictions Precautions Precautions: Fall Precaution Comments: orthostatic hypotension Restrictions Weight Bearing Restrictions: Yes RLE Weight Bearing: Weight bearing as tolerated     Mobility  Bed Mobility               General bed mobility comments: Bed utilized for chair position and repositioning in bed. Orthostatics taken from supine to sitting using chair position.    Transfers                   General transfer comment: Not attempted due to orthostatic hypotension.    Ambulation/Gait               General Gait Details: unable to attempt   Stairs             Wheelchair Mobility    Modified Rankin (Stroke Patients Only)       Balance                                            Cognition Arousal/Alertness: Awake/alert Behavior During Therapy: Flat affect Overall Cognitive Status: History of cognitive impairments - at baseline                                 General Comments: hx of advanced dementia, does not follow commands despite various cues types        Exercises General Exercises - Lower Extremity Ankle Circles/Pumps: PROM, Both, 10 reps  Short Arc Quad: PROM, 10 reps, Both Heel Slides: 10 reps, PROM, Limitations Heel Slides Limitations: resisting movement Hip ABduction/ADduction: AAROM, 10 reps, Limitations Hip Abduction/Adduction Limitations: resisting movement Straight Leg Raises: PROM, 10 reps    General Comments        Pertinent Vitals/Pain Pain Assessment Pain Assessment: Faces Faces Pain Scale: Hurts little more Pain Location: RLE with exercise/ROM activity Pain Descriptors / Indicators: Grimacing, Operative site guarding Pain Intervention(s): Limited activity within patient's tolerance, Monitored during session, Repositioned    Home Living                          Prior Function             PT Goals (current goals can now be found in the care plan section) Acute Rehab PT Goals Patient Stated Goal: None stated PT Goal Formulation: Patient unable to participate in goal setting Time For Goal Achievement: 07/17/22 Potential to Achieve Goals: Good Progress towards PT goals: Progressing toward goals    Frequency    Min 3X/week      PT Plan Current plan remains appropriate    Co-evaluation              AM-PAC PT "6 Clicks" Mobility   Outcome Measure  Help needed turning from your back to your side while in a flat bed without using bedrails?: A Lot Help needed moving from lying on your back to sitting on the side of a flat bed without using bedrails?: Total Help needed moving to and from a bed to a chair (including a wheelchair)?: Total Help needed standing up from a chair using your arms (e.g., wheelchair or bedside chair)?: Total Help needed to walk in hospital room?: Total Help needed climbing 3-5 steps with a railing? : Total 6 Click Score: 7    End of Session Equipment Utilized During Treatment: Gait belt Activity Tolerance: Treatment limited secondary to medical complications (Comment) (Orthostatic hypotension) Patient left: in bed;with call bell/phone within reach;with bed alarm set Nurse Communication: Mobility status PT Visit Diagnosis: Pain;Muscle weakness (generalized) (M62.81);History of falling (Z91.81);Difficulty in walking, not elsewhere classified (R26.2) Pain - Right/Left: Right Pain - part of body: Hip     Time: 1141-1200 PT Time Calculation (min) (ACUTE ONLY): 19 min  Charges:  $Therapeutic Activity: 8-22 mins                     Rolinda Roan, PT, DPT Acute Rehabilitation Services Secure Chat Preferred Office: [phone]    [name] 07/12/2022, 1:05 PM

## 2022-07-13 DIAGNOSIS — E870 Hyperosmolality and hypernatremia: Secondary | ICD-10-CM | POA: Diagnosis not present

## 2022-07-13 LAB — BASIC METABOLIC PANEL
Anion gap: 13 (ref 5–15)
BUN: 50 mg/dL — ABNORMAL HIGH (ref 8–23)
CO2: 27 mmol/L (ref 22–32)
Calcium: 9.3 mg/dL (ref 8.9–10.3)
Chloride: 109 mmol/L (ref 98–111)
Creatinine, Ser: 1.05 mg/dL — ABNORMAL HIGH (ref 0.44–1.00)
GFR, Estimated: 57 mL/min — ABNORMAL LOW (ref >60)
Glucose, Bld: 146 mg/dL — ABNORMAL HIGH (ref 70–99)
Potassium: 3.3 mmol/L — ABNORMAL LOW (ref 3.5–5.1)
Sodium: 149 mmol/L — ABNORMAL HIGH (ref 135–145)

## 2022-07-13 LAB — CBC
HCT: 40.1 % (ref 36.0–46.0)
Hemoglobin: 12.7 g/dL (ref 12.0–15.0)
MCH: 29.4 pg (ref 26.0–34.0)
MCHC: 31.7 g/dL (ref 30.0–36.0)
MCV: 92.8 fL (ref 80.0–100.0)
Platelets: 310 10*3/uL (ref 150–400)
RBC: 4.32 MIL/uL (ref 3.87–5.11)
RDW: 13.3 % (ref 11.5–15.5)
WBC: 12.6 10*3/uL — ABNORMAL HIGH (ref 4.0–10.5)
nRBC: 0 % (ref 0.0–0.2)

## 2022-07-13 LAB — GLUCOSE, CAPILLARY
Glucose-Capillary: 133 mg/dL — ABNORMAL HIGH (ref 70–99)
Glucose-Capillary: 136 mg/dL — ABNORMAL HIGH (ref 70–99)
Glucose-Capillary: 106 mg/dL — ABNORMAL HIGH (ref 70–99)
Glucose-Capillary: 111 mg/dL — ABNORMAL HIGH (ref 70–99)

## 2022-07-13 LAB — URINALYSIS, ROUTINE W REFLEX MICROSCOPIC
Bilirubin Urine: NEGATIVE
Glucose, UA: NEGATIVE mg/dL
Hgb urine dipstick: NEGATIVE
Ketones, ur: NEGATIVE mg/dL
Nitrite: POSITIVE — AB
Protein, ur: 30 mg/dL — AB
Specific Gravity, Urine: 1.02 (ref 1.005–1.030)
WBC, UA: >50 WBC/hpf — ABNORMAL HIGH (ref 0–5)
pH: 5 (ref 5.0–8.0)

## 2022-07-13 MED ORDER — LACTATED RINGERS IV BOLUS
1000.0000 mL | Freq: Once | INTRAVENOUS | Status: AC
  Administered 2022-07-13: 1000 mL via INTRAVENOUS

## 2022-07-13 MED ORDER — POTASSIUM CHLORIDE 10 MEQ/100ML IV SOLN
10.0000 meq | INTRAVENOUS | Status: AC
  Administered 2022-07-13 (×2): 10 meq via INTRAVENOUS
  Filled 2022-07-13 (×2): qty 100

## 2022-07-13 MED ORDER — SENNA 8.6 MG PO TABS
1.0000 | ORAL_TABLET | Freq: Every day | ORAL | Status: DC
  Administered 2022-07-13 – 2022-07-15 (×3): 8.6 mg via ORAL
  Filled 2022-07-13 (×3): qty 1

## 2022-07-13 MED ORDER — BISACODYL 5 MG PO TBEC
5.0000 mg | DELAYED_RELEASE_TABLET | Freq: Once | ORAL | Status: AC
  Administered 2022-07-13: 5 mg via ORAL
  Filled 2022-07-13: qty 1

## 2022-07-13 MED ORDER — POTASSIUM CHLORIDE 10 MEQ/100ML IV SOLN
10.0000 meq | Freq: Once | INTRAVENOUS | Status: AC
  Administered 2022-07-13: 10 meq via INTRAVENOUS
  Filled 2022-07-13: qty 100

## 2022-07-13 MED ORDER — POTASSIUM CHLORIDE 20 MEQ PO PACK
20.0000 meq | PACK | Freq: Once | ORAL | Status: AC
  Administered 2022-07-13: 20 meq via ORAL
  Filled 2022-07-13: qty 1

## 2022-07-13 NOTE — Progress Notes (Addendum)
PROGRESS NOTE    [name]  DHR:[id number] DOB: 03/27/1951 DOA: 07/01/2022 PCP: Caryl Bis, MD   Brief Narrative: 71 year old with past medical history significant for severe dementia, diabetes type 2, hyperlipidemia, resided at memory care unit facility, had a fall, complaining of right hip pain.  CT of pelvis showed impacted right femoral neck fracture.  Orthopedic was consulted.  Patient underwent ORIF on 11/5.  Hemoglobin remained stable postoperative. Patient with poor oral intake.  Palliative care was consulted for goals of care conversation.  Plan is to continue with medical management.  Patient was supposed to be discharged 11/14 but facility was unable to accept patient after 8 PM.  B-met  check on 11/15 show a sodium of 156.    Assessment & Plan:   Principal Problem:   Closed right hip fracture (HCC) Active Problems:   Hyperlipidemia   GERD (gastroesophageal reflux disease)   Hypertension   Diabetes mellitus ([place])   Dementia without behavioral disturbance ([place])  1-Hypernatremia;  In setting of poor oral intake.  Started  D 5 IV fluids.  Encourage oral intake.  Improving, sodium down to 149. She is more alert.   2-Closed Right hip fracture: Mechanical fall.  Orthopedic was consulted.  Patient underwent intramedullary nailing of the right intertrochanteric femoral Continue with DVT prophylaxis.  Pain management.  Diabetes type 2 with hyperglycemia: Not on oral hypoglycemic agents at home.  A1c 5.5  AKI: Creatinine baseline 0.5 on admission 1.1 Received IV fluids  Hyperlipidemia; Continue with statins.   GERD; Continue with PPI  Elevation BP; resolved after pain controlled.   Hypokalemia; replete orally.   Advanced dementia without behavioral disturbances. Patient with poor oral intake.  She was started on Marinol.  Palliative care consulted.  Family wants to continue with current care plan.     Estimated body mass index is 20.52 kg/m as calculated  from the following:   Height as of this encounter: 5' 2.01" (1.575 m).   Weight as of this encounter: 50.9 kg.   DVT prophylaxis: Lovenox Code Status: DNR Family Communication: Sister over phone 11/15.  Disposition Plan:  Status is: Inpatient Remains inpatient appropriate because: management of hypernatremia. Hopefully transfer to [facility] 11/17    Consultants:  Ortho palliative  Procedures:    Antimicrobials:    Subjective: She is more alert, she ate half of her breakfast. Denies pain.  No recent bowel Movement documented., will order laxative.   Objective: Vitals:   07/12/22 1152 07/12/22 1157 07/12/22 1939 07/13/22 0745  BP: (!) 89/73 109/81 (!) 86/75 110/79  Pulse:   100 (!) 101  Resp:   17 18  Temp:   97.7 F (36.5 C) 97.9 F (36.6 C)  TempSrc:   Axillary Oral  SpO2:   100% 98%  Weight:      Height:        Intake/Output Summary (Last 24 hours) at 07/13/2022 1429 Last data filed at 07/13/2022 1405 Gross per 24 hour  Intake 359.18 ml  Output --  Net 359.18 ml    Filed Weights   07/02/22 0237 07/02/22 1215  Weight: 50.9 kg 50.9 kg    Examination:  General exam: NAD Respiratory system: CTA Cardiovascular system: S 1, S 2 RRR Gastrointestinal system: BS present, soft, nt Central nervous system: Alert Extremities: no edema    Data Reviewed: I have personally reviewed following labs and imaging studies  CBC: Recent Labs  Lab 07/13/22 0307  WBC 12.6*  HGB 12.7  HCT 40.1  MCV 92.8  PLT 086   Basic Metabolic Panel: Recent Labs  Lab 07/09/22 0636 07/12/22 0814 07/13/22 0307  NA  --  156* 149*  K  --  3.6 3.3*  CL  --  114* 109  CO2  --  29 27  GLUCOSE  --  141* 146*  BUN  --  55* 50*  CREATININE 0.84 1.15* 1.05*  CALCIUM  --  9.8 9.3  MG  --  2.4  --     GFR: Estimated Creatinine Clearance: 39.4 mL/min (A) (by C-G formula based on SCr of 1.05 mg/dL (H)). Liver Function Tests: No results for input(s): "AST", "ALT", "ALKPHOS",  "BILITOT", "PROT", "ALBUMIN" in the last 168 hours. No results for input(s): "LIPASE", "AMYLASE" in the last 168 hours. No results for input(s): "AMMONIA" in the last 168 hours. Coagulation Profile: No results for input(s): "INR", "PROTIME" in the last 168 hours. Cardiac Enzymes: No results for input(s): "CKTOTAL", "CKMB", "CKMBINDEX", "TROPONINI" in the last 168 hours. BNP (last 3 results) No results for input(s): "PROBNP" in the last 8760 hours. HbA1C: No results for input(s): "HGBA1C" in the last 72 hours. CBG: Recent Labs  Lab 07/12/22 1203 07/12/22 1631 07/12/22 1919 07/13/22 0800 07/13/22 1153  GLUCAP 117* 177* 154* 133* 136*    Lipid Profile: No results for input(s): "CHOL", "HDL", "LDLCALC", "TRIG", "CHOLHDL", "LDLDIRECT" in the last 72 hours. Thyroid Function Tests: No results for input(s): "TSH", "T4TOTAL", "FREET4", "T3FREE", "THYROIDAB" in the last 72 hours. Anemia Panel: No results for input(s): "VITAMINB12", "FOLATE", "FERRITIN", "TIBC", "IRON", "RETICCTPCT" in the last 72 hours. Sepsis Labs: No results for input(s): "PROCALCITON", "LATICACIDVEN" in the last 168 hours.  No results found for this or any previous visit (from the past 240 hour(s)).       Radiology Studies: No results found.      Scheduled Meds:  atorvastatin  40 mg Oral QHS   bisacodyl  5 mg Oral Once   citalopram  10 mg Oral Daily   divalproex  125 mg Oral 2 times per day   divalproex  250 mg Oral Q24H   docusate sodium  100 mg Oral BID   dronabinol  2.5 mg Oral BID AC   enoxaparin (LOVENOX) injection  40 mg Subcutaneous Q24H   famotidine  10 mg Oral BID   insulin aspart  0-15 Units Subcutaneous TID WC   insulin aspart  0-5 Units Subcutaneous QHS   mirtazapine  30 mg Oral QHS   montelukast  10 mg Oral QHS   Continuous Infusions:  sodium chloride Stopped (07/10/22 0701)   dextrose 100 mL/hr at 07/13/22 0742     LOS: 12 days    Time spent: 35 minutes    Nyeshia Mysliwiec A  Mujahid Jalomo, MD [facility]   If 7PM-7AM, please contact night-coverage [email or web address]  07/13/2022, 2:29 PM

## 2022-07-13 NOTE — TOC Progression Note (Signed)
Transition of Care [facility]) - Progression Note    Patient Details  Name: [name] MRN: [id number] Date of Birth: 1951-03-07  Transition of Care [facility]) CM/SW Contact  Joanne Chars, LCSW Phone Number: 07/13/2022, 11:56 AM  Clinical Narrative:  CSW updated [facility] that pt will not DC today.      Expected Discharge Plan: [place] Barriers to Discharge: Barriers Resolved  Expected Discharge Plan and Services Expected Discharge Plan: [place] In-house Referral: Clinical Social Work   Post Acute Care Choice: [place] Living arrangements for the past 2 months: [place] ([facility] -memory care) Expected Discharge Date: 07/06/22                                     Social Determinants of Health (SDOH) Interventions    Readmission Risk Interventions     No data to display

## 2022-07-13 NOTE — Progress Notes (Signed)
Patient's iv became occluded.  Tried to restart iv in left wrist and right wrist. Not successful.  Placed consult for iv team to restart iv for patient's fluids and new orders for potassium iv.  Will have day shift to follow up.

## 2022-07-13 NOTE — Plan of Care (Signed)
  Problem: Education: Goal: Ability to describe self-care measures that may prevent or decrease complications (Diabetes Survival Skills Education) will improve Outcome: Progressing Goal: Individualized Educational Video(s) Outcome: Progressing   Problem: Coping: Goal: Ability to adjust to condition or change in health will improve Outcome: Progressing   Problem: Fluid Volume: Goal: Ability to maintain a balanced intake and output will improve Outcome: Progressing   Problem: Health Behavior/Discharge Planning: Goal: Ability to identify and utilize available resources and services will improve Outcome: Progressing Goal: Ability to manage health-related needs will improve Outcome: Progressing   Problem: Metabolic: Goal: Ability to maintain appropriate glucose levels will improve Outcome: Progressing   Problem: Nutritional: Goal: Maintenance of adequate nutrition will improve Outcome: Progressing Goal: Progress toward achieving an optimal weight will improve Outcome: Progressing   Problem: Skin Integrity: Goal: Risk for impaired skin integrity will decrease Outcome: Progressing   Problem: Tissue Perfusion: Goal: Adequacy of tissue perfusion will improve Outcome: Progressing   Problem: Education: Goal: Knowledge of General Education information will improve Description: Including pain rating scale, medication(s)/side effects and non-pharmacologic comfort measures Outcome: Progressing   Problem: Health Behavior/Discharge Planning: Goal: Ability to manage health-related needs will improve Outcome: Progressing   Problem: Clinical Measurements: Goal: Ability to maintain clinical measurements within normal limits will improve Outcome: Progressing Goal: Diagnostic test results will improve Outcome: Progressing   Problem: Activity: Goal: Risk for activity intolerance will decrease Outcome: Progressing   Problem: Nutrition: Goal: Adequate nutrition will be  maintained Outcome: Progressing   Problem: Elimination: Goal: Will not experience complications related to bowel motility Outcome: Progressing   Problem: Pain Managment: Goal: General experience of comfort will improve Outcome: Progressing   Problem: Safety: Goal: Ability to remain free from injury will improve Outcome: Progressing   Problem: Skin Integrity: Goal: Risk for impaired skin integrity will decrease Outcome: Progressing

## 2022-07-14 DIAGNOSIS — N3000 Acute cystitis without hematuria: Secondary | ICD-10-CM

## 2022-07-14 DIAGNOSIS — Z515 Encounter for palliative care: Secondary | ICD-10-CM | POA: Diagnosis not present

## 2022-07-14 DIAGNOSIS — E870 Hyperosmolality and hypernatremia: Secondary | ICD-10-CM | POA: Insufficient documentation

## 2022-07-14 LAB — CBC
HCT: 34.7 % — ABNORMAL LOW (ref 36.0–46.0)
Hemoglobin: 10.4 g/dL — ABNORMAL LOW (ref 12.0–15.0)
MCH: 28.6 pg (ref 26.0–34.0)
MCHC: 30 g/dL (ref 30.0–36.0)
MCV: 95.3 fL (ref 80.0–100.0)
Platelets: 236 10*3/uL (ref 150–400)
RBC: 3.64 MIL/uL — ABNORMAL LOW (ref 3.87–5.11)
RDW: 13.3 % (ref 11.5–15.5)
WBC: 9.2 10*3/uL (ref 4.0–10.5)
nRBC: 0 % (ref 0.0–0.2)

## 2022-07-14 LAB — BASIC METABOLIC PANEL
Anion gap: 10 (ref 5–15)
BUN: 33 mg/dL — ABNORMAL HIGH (ref 8–23)
CO2: 27 mmol/L (ref 22–32)
Calcium: 8.9 mg/dL (ref 8.9–10.3)
Chloride: 108 mmol/L (ref 98–111)
Creatinine, Ser: 1.05 mg/dL — ABNORMAL HIGH (ref 0.44–1.00)
GFR, Estimated: 57 mL/min — ABNORMAL LOW (ref >60)
Glucose, Bld: 118 mg/dL — ABNORMAL HIGH (ref 70–99)
Potassium: 3.8 mmol/L (ref 3.5–5.1)
Sodium: 145 mmol/L (ref 135–145)

## 2022-07-14 LAB — GLUCOSE, CAPILLARY
Glucose-Capillary: 135 mg/dL — ABNORMAL HIGH (ref 70–99)
Glucose-Capillary: 138 mg/dL — ABNORMAL HIGH (ref 70–99)
Glucose-Capillary: 119 mg/dL — ABNORMAL HIGH (ref 70–99)
Glucose-Capillary: 131 mg/dL — ABNORMAL HIGH (ref 70–99)

## 2022-07-14 MED ORDER — SODIUM CHLORIDE 0.9 % IV SOLN
2.0000 g | INTRAVENOUS | Status: DC
  Administered 2022-07-14 – 2022-07-15 (×2): 2 g via INTRAVENOUS
  Filled 2022-07-14 (×2): qty 20

## 2022-07-14 NOTE — Plan of Care (Signed)
  Problem: Education: Goal: Ability to describe self-care measures that may prevent or decrease complications (Diabetes Survival Skills Education) will improve Outcome: Progressing Goal: Individualized Educational Video(s) Outcome: Progressing   Problem: Coping: Goal: Ability to adjust to condition or change in health will improve Outcome: Progressing   Problem: Fluid Volume: Goal: Ability to maintain a balanced intake and output will improve Outcome: Progressing   Problem: Health Behavior/Discharge Planning: Goal: Ability to identify and utilize available resources and services will improve Outcome: Progressing Goal: Ability to manage health-related needs will improve Outcome: Progressing   Problem: Metabolic: Goal: Ability to maintain appropriate glucose levels will improve Outcome: Progressing   Problem: Nutritional: Goal: Maintenance of adequate nutrition will improve Outcome: Progressing Goal: Progress toward achieving an optimal weight will improve Outcome: Progressing   Problem: Skin Integrity: Goal: Risk for impaired skin integrity will decrease Outcome: Progressing   Problem: Tissue Perfusion: Goal: Adequacy of tissue perfusion will improve Outcome: Progressing   Problem: Education: Goal: Knowledge of General Education information will improve Description: Including pain rating scale, medication(s)/side effects and non-pharmacologic comfort measures Outcome: Progressing   Problem: Health Behavior/Discharge Planning: Goal: Ability to manage health-related needs will improve Outcome: Progressing   Problem: Clinical Measurements: Goal: Ability to maintain clinical measurements within normal limits will improve Outcome: Progressing Goal: Diagnostic test results will improve Outcome: Progressing   Problem: Activity: Goal: Risk for activity intolerance will decrease Outcome: Progressing   Problem: Nutrition: Goal: Adequate nutrition will be  maintained Outcome: Progressing   Problem: Elimination: Goal: Will not experience complications related to bowel motility Outcome: Progressing   Problem: Pain Managment: Goal: General experience of comfort will improve Outcome: Progressing   Problem: Safety: Goal: Ability to remain free from injury will improve Outcome: Progressing   Problem: Skin Integrity: Goal: Risk for impaired skin integrity will decrease Outcome: Progressing

## 2022-07-14 NOTE — Progress Notes (Signed)
Physical Therapy Treatment Patient Details Name: [name] MRN: [id number] DOB: 1951/05/19 Today's Date: 07/14/2022   History of Present Illness Pt is a 71 y/o female who presents s/p fall at her memory unit and was admitted on 11/4 with a R intertrochanteric fracture. She is now s/p IM nailing on 11/5 and is WBAT.  PMHx:  DM with hyperglycemia, HLD, GERD, HTN,    PT Comments    Pt progressing towards physical therapy goals. BP closely monitored throughout session - see below for details. Pt alert, pleasant and talkative today. She was willing to attempt EOB activity and to stand. Pt able to sit EOB with posterior support ~8 minutes, however once stand was attempted, pt became mildly agitated and appeared fearful. She was returned to the bed and placed in chair position. Will continue to follow and progress as able per POC.   Orthostatic BPs  Supine HOB 15 93/74  Supine HOB 30 103/68  Sitting HOB 45 105/68  Sitting HOB 65 97/71  Sitting EOB fully upright 98/66  Sitting upright in bed  (chair position at end of session) 107/64      Recommendations for follow up therapy are one component of a multi-disciplinary discharge planning process, led by the attending physician.  Recommendations may be updated based on patient status, additional functional criteria and insurance authorization.  Follow Up Recommendations  Skilled nursing-short term rehab (<3 hours/day) Can patient physically be transported by private vehicle: No   Assistance Recommended at Discharge Frequent or constant Supervision/Assistance  Patient can return home with the following Two people to help with walking and/or transfers;A lot of help with bathing/dressing/bathroom;Direct supervision/assist for medications management;Direct supervision/assist for financial management;Assist for transportation   Equipment Recommendations  Other (comment) (TBD by next venue of care)    Recommendations for Other Services        Precautions / Restrictions Precautions Precautions: Fall Precaution Comments: Monitor BP Restrictions Weight Bearing Restrictions: Yes RLE Weight Bearing: Weight bearing as tolerated     Mobility  Bed Mobility Overal bed mobility: Needs Assistance Bed Mobility: Supine to Sit, Sit to Supine     Supine to sit: Mod assist, HOB elevated Sit to supine: Max assist, +2 for physical assistance   General bed mobility comments: HOB elevated slowly while BP monitored, and once in full upright position with BP stable, pt was able to initiate transition to EOB with mod assist for scooting (bed pad utilized). Pt able to follow some commands to complete transfer.    Transfers Overall transfer level: Needs assistance Equipment used: 2 person hand held assist Transfers: Sit to/from Stand Sit to Stand: Max assist, +2 physical assistance           General transfer comment: Pt willing to attempt standing, however once hips left bed surface, she appeared very fearful and sat back down on the bed.    Ambulation/Gait               General Gait Details: unable to attempt   Stairs             Wheelchair Mobility    Modified Rankin (Stroke Patients Only)       Balance Overall balance assessment: Needs assistance, History of Falls Sitting-balance support: Feet supported, Single extremity supported Sitting balance-Leahy Scale: Poor Sitting balance - Comments: Poor initially, then zero as requiring up to max assist. Postural control: Posterior lean  Cognition Arousal/Alertness: Awake/alert Behavior During Therapy: Flat affect Overall Cognitive Status: History of cognitive impairments - at baseline                                 General Comments: hx of advanced dementia        Exercises      General Comments        Pertinent Vitals/Pain Pain Assessment Pain Assessment: Faces Faces Pain  Scale: Hurts little more Pain Location: RLE with exercise/ROM activity Pain Descriptors / Indicators: Grimacing, Operative site guarding Pain Intervention(s): Limited activity within patient's tolerance, Monitored during session, Repositioned    Home Living                          Prior Function            PT Goals (current goals can now be found in the care plan section) Acute Rehab PT Goals Patient Stated Goal: None stated PT Goal Formulation: Patient unable to participate in goal setting Time For Goal Achievement: 07/17/22 Potential to Achieve Goals: Good Progress towards PT goals: Progressing toward goals    Frequency    Min 3X/week      PT Plan Current plan remains appropriate    Co-evaluation              AM-PAC PT "6 Clicks" Mobility   Outcome Measure  Help needed turning from your back to your side while in a flat bed without using bedrails?: A Lot Help needed moving from lying on your back to sitting on the side of a flat bed without using bedrails?: A Lot Help needed moving to and from a bed to a chair (including a wheelchair)?: Total Help needed standing up from a chair using your arms (e.g., wheelchair or bedside chair)?: Total Help needed to walk in hospital room?: Total Help needed climbing 3-5 steps with a railing? : Total 6 Click Score: 8    End of Session Equipment Utilized During Treatment: Gait belt Activity Tolerance: Patient tolerated treatment well Patient left: in bed;with call bell/phone within reach;with bed alarm set Nurse Communication: Mobility status PT Visit Diagnosis: Pain;Muscle weakness (generalized) (M62.81);History of falling (Z91.81);Difficulty in walking, not elsewhere classified (R26.2) Pain - Right/Left: Right Pain - part of body: Hip     Time: 0865-7846 PT Time Calculation (min) (ACUTE ONLY): 21 min  Charges:  $Therapeutic Activity: 8-22 mins                     [name], PT, DPT Acute  Rehabilitation Services Secure Chat Preferred Office: [phone]    [name] 07/14/2022, 2:49 PM

## 2022-07-14 NOTE — TOC Progression Note (Signed)
Transition of Care [facility]) - Progression Note    Patient Details  Name: [name] MRN: [id number] Date of Birth: 1951/03/09  Transition of Care [facility]) CM/SW Contact  Joanne Chars, LCSW Phone Number: 07/14/2022, 10:05 AM  Clinical Narrative:   Per MD, not stable for DC, anticipate DC Saturday.  CSW spoke with [facility], they can accept pt tomorrow, Ebony Hail will also be the weekend contact. CSW spoke with Fayette Pho and updated him.    Expected Discharge Plan: [place] Barriers to Discharge: Barriers Resolved  Expected Discharge Plan and Services Expected Discharge Plan: [place] In-house Referral: Clinical Social Work   Post Acute Care Choice: [place] Living arrangements for the past 2 months: [place] ([place]-memory care) Expected Discharge Date: 07/06/22                                     Social Determinants of Health (SDOH) Interventions    Readmission Risk Interventions     No data to display

## 2022-07-14 NOTE — Progress Notes (Signed)
   Palliative Medicine Inpatient Follow Up Note HPI: 71 y.o. female  with past medical history of severe dementia, type 2 diabetes, hyperlipidemia, GERD, and depression  admitted on 07/01/2022 with fall. X-ray and CT of her hip show impacted right femoral neck fracture; S/p surgical repair. Patient with significant loss of function and poor PO intake. PMT consulted to discuss [place].    RN at bedside has been with patient for 3 days in a row and reports multiple attempts at PO intake with very little success at getting patient to take in liquids or solids. I also sat at bedside and attempted to feed patient with no success.    Today's Discussion 07/14/2022  *Please note that this is a verbal dictation therefore any spelling or grammatical errors are due to the "[place]" system interpretation.  Chart reviewed inclusive of vital signs, progress notes, laboratory results, and diagnostic images.   I met with Ezrah this morning at bedside. She is resting though arousable. She is confused though pleasantly so. No vocalizations of pain, shortness of breath, nausea. No nonverbal responses to indicate otherwise.   I called patients sister and brother in law. Right now the hope is to see how well Cyrene does outside of the hospital prior to additional Palliative involvement. I provided my direct number should that change. Update provided in terms of the worry regarding patients poor appetite, mobility. Encouraged consideration of Palliative care should she continue to lack in improvements.   Questions answered and support provided.   Objective Assessment: Vital Signs Vitals:   07/14/22 0519 07/14/22 0755  BP: 103/66 95/73  Pulse: 90 80  Resp: 17 16  Temp: 97.9 F (36.6 C) 98 F (36.7 C)  SpO2: 96% 96%    Intake/Output Summary (Last 24 hours) at 07/14/2022 2111 Last data filed at 07/14/2022 0900 Gross per 24 hour  Intake 1395.64 ml  Output --  Net 1395.64 ml    Last Weight  Most  recent update: 07/02/2022 12:16 PM    Weight  50.9 kg (112 lb 3.4 oz)            Gen:  Frail elderly F in NAD HEENT: Dry mucous membranes CV: Regular rate and rhythm  PULM: On RA, breathing is even and nonlabored ABD: soft/nontender  EXT: No edema  Neuro: Alert to self  SUMMARY OF RECOMMENDATIONS   DNAR/DNI  Plan for Rehab placement today at [facility] not presently interested in Hospice care  or OP Palliative support  Continue  low dose Marinol for appetite support  Ongoing incremental supportive care  Billing based on MDM: Moderate  _____________________________________________________________________________________ [place] Team Team Cell Phone: [phone] Please utilize secure chat with additional questions, if there is no response within 30 minutes please call the above phone number  Palliative Medicine Team providers are available by phone from 7am to 7pm daily and can be reached through the team cell phone.  Should this patient require assistance outside of these hours, please call the patient's attending physician.

## 2022-07-14 NOTE — Progress Notes (Signed)
PROGRESS NOTE    [name]  JOI:[id number] DOB: 09-15-1950 DOA: 07/01/2022 PCP: Angela Bis, MD   Brief Narrative: 71 year old with past medical history significant for severe dementia, diabetes type 2, hyperlipidemia, resided at memory care unit facility, had Angela fall, complaining of right hip pain.  CT of pelvis showed impacted right femoral neck fracture.  Orthopedic was consulted.  Patient underwent ORIF on 11/5.  Hemoglobin remained stable postoperative. Patient with poor oral intake.  Palliative care was consulted for goals of care conversation.  Plan is to continue with medical management.  Patient was supposed to be discharged 11/14 but facility was unable to accept patient after 8 PM.  B-met  check on 11/15 show Angela sodium of 156.    Assessment & Plan:   Principal Problem:   Closed right hip fracture (HCC) Active Problems:   Hyperlipidemia   GERD (gastroesophageal reflux disease)   Hypertension   Diabetes mellitus ([name])   Dementia without behavioral disturbance ([name])  1-Hypernatremia;  In setting of poor oral intake.  Continue with  D 5 IV fluids.  Encourage oral intake.  Improving, sodium down to 145   2-Closed Right hip fracture: Mechanical fall.  Orthopedic was consulted.  Patient underwent intramedullary nailing of the right intertrochanteric femoral Continue with DVT prophylaxis.  Pain management.  UTI;  UA with more than 50 WBC>  Started IV ceftriaxone.  Follow urine culture.   Hypotension.  Respond to IV fluids.   Diabetes type 2 with hyperglycemia: Not on oral hypoglycemic agents at home.  A1c 5.5  AKI: Creatinine baseline 0.5 on admission 1.1 Received IV fluids  Hyperlipidemia; Continue with statins.   GERD; Continue with PPI  Elevation BP; resolved after pain controlled.   Hypokalemia; Replaced.   Advanced dementia without behavioral disturbances. Patient with poor oral intake.  She was started on Marinol.  Palliative care consulted.   Family wants to continue with current care plan.     Estimated body mass index is 20.52 kg/m as calculated from the following:   Height as of this encounter: 5' 2.01" (1.575 m).   Weight as of this encounter: 50.9 kg.   DVT prophylaxis: Lovenox Code Status: DNR Family Communication: Sister over phone 11/17.  Disposition Plan:  Status is: Inpatient Remains inpatient appropriate because: management of hypernatremia. Hopefully transfer to [facility] 11/17    Consultants:  Ortho palliative  Procedures:    Antimicrobials:    Subjective: She is alert, pleasantly confuse.  Didn't eat much breakfast.   Objective: Vitals:   07/13/22 1613 07/13/22 1929 07/14/22 0519 07/14/22 0755  BP: (!) 88/66 (!) 148/116 103/66 95/73  Pulse: 96 88 90 80  Resp: _0 Temp: 98.1 F (36.7 C) 98.2 F (36.8 C) 97.9 F (36.6 C) 98 F (36.7 C)  TempSrc: Oral Oral Oral Oral  SpO2: 100% 97% 96% 96%  Weight:      Height:        Intake/Output Summary (Last 24 hours) at 07/14/2022 1347 Last data filed at 07/14/2022 1343 Gross per 24 hour  Intake 1470.64 ml  Output --  Net 1470.64 ml    Filed Weights   07/02/22 0237 07/02/22 1215  Weight: 50.9 kg 50.9 kg    Examination:  General exam: NAD Respiratory system: CTA Cardiovascular system: S 1, S 2 RRR Gastrointestinal system: BS present, soft, nt Central nervous system: alert, confuse Extremities: no edema    Data Reviewed: I have personally reviewed following labs and imaging studies  CBC: Recent Labs  Lab 07/13/22 0307 07/14/22 0451  WBC 12.6* 9.2  HGB 12.7 10.4*  HCT 40.1 34.7*  MCV 92.8 95.3  PLT 310 471    Basic Metabolic Panel: Recent Labs  Lab 07/09/22 0636 07/12/22 0814 07/13/22 0307 07/14/22 0451  NA  --  156* 149* 145  K  --  3.6 3.3* 3.8  CL  --  114* 109 108  CO2  --  _0 GLUCOSE  --  141* 146* 118*  BUN  --  55* 50* 33*  CREATININE 0.84 1.15* 1.05* 1.05*  CALCIUM  --  9.8 9.3 8.9  MG   --  2.4  --   --     GFR: Estimated Creatinine Clearance: 39.4 mL/min (Angela) (by C-G formula based on SCr of 1.05 mg/dL (H)). Liver Function Tests: No results for input(s): "AST", "ALT", "ALKPHOS", "BILITOT", "PROT", "ALBUMIN" in the last 168 hours. No results for input(s): "LIPASE", "AMYLASE" in the last 168 hours. No results for input(s): "AMMONIA" in the last 168 hours. Coagulation Profile: No results for input(s): "INR", "PROTIME" in the last 168 hours. Cardiac Enzymes: No results for input(s): "CKTOTAL", "CKMB", "CKMBINDEX", "TROPONINI" in the last 168 hours. BNP (last 3 results) No results for input(s): "PROBNP" in the last 8760 hours. HbA1C: No results for input(s): "HGBA1C" in the last 72 hours. CBG: Recent Labs  Lab 07/13/22 1153 07/13/22 1638 07/13/22 1925 07/14/22 0758 07/14/22 1156  GLUCAP 136* 106* 111* 135* 138*    Lipid Profile: No results for input(s): "CHOL", "HDL", "LDLCALC", "TRIG", "CHOLHDL", "LDLDIRECT" in the last 72 hours. Thyroid Function Tests: No results for input(s): "TSH", "T4TOTAL", "FREET4", "T3FREE", "THYROIDAB" in the last 72 hours. Anemia Panel: No results for input(s): "VITAMINB12", "FOLATE", "FERRITIN", "TIBC", "IRON", "RETICCTPCT" in the last 72 hours. Sepsis Labs: No results for input(s): "PROCALCITON", "LATICACIDVEN" in the last 168 hours.  No results found for this or any previous visit (from the past 240 hour(s)).       Radiology Studies: No results found.      Scheduled Meds:  atorvastatin  40 mg Oral QHS   citalopram  10 mg Oral Daily   divalproex  125 mg Oral 2 times per day   divalproex  250 mg Oral Q24H   docusate sodium  100 mg Oral BID   dronabinol  2.5 mg Oral BID AC   enoxaparin (LOVENOX) injection  40 mg Subcutaneous Q24H   famotidine  10 mg Oral BID   insulin aspart  0-15 Units Subcutaneous TID WC   insulin aspart  0-5 Units Subcutaneous QHS   mirtazapine  30 mg Oral QHS   montelukast  10 mg Oral QHS    senna  1 tablet Oral Daily   Continuous Infusions:  sodium chloride Stopped (07/10/22 0701)   cefTRIAXone (ROCEPHIN)  IV 2 g (07/14/22 0659)   dextrose 100 mL/hr at 07/14/22 0702     LOS: 13 days    Time spent: 35 minutes    [name] [name] [place], MD [facility]   If 7PM-7AM, please contact night-coverage [email or web address]  07/14/2022, 1:47 PM

## 2022-07-15 DIAGNOSIS — S72001A Fracture of unspecified part of neck of right femur, initial encounter for closed fracture: Secondary | ICD-10-CM | POA: Diagnosis not present

## 2022-07-15 DIAGNOSIS — Z515 Encounter for palliative care: Secondary | ICD-10-CM | POA: Diagnosis not present

## 2022-07-15 LAB — GLUCOSE, CAPILLARY
Glucose-Capillary: 141 mg/dL — ABNORMAL HIGH (ref 70–99)
Glucose-Capillary: 205 mg/dL — ABNORMAL HIGH (ref 70–99)

## 2022-07-15 MED ORDER — SENNA 8.6 MG PO TABS: 1.0000 | ORAL_TABLET | Freq: Every day | ORAL | 0 refills | Status: DC

## 2022-07-15 MED ORDER — CEPHALEXIN 500 MG PO CAPS: 500.0000 mg | ORAL_CAPSULE | Freq: Three times a day (TID) | ORAL | 0 refills | Status: AC

## 2022-07-15 NOTE — Discharge Summary (Signed)
Physician Discharge Summary   Patient: [name] MRN: [id number] DOB: 02/07/1951  Admit date:     07/01/2022  Discharge date: 07/15/22  Discharge Physician: Elmarie Shiley   PCP: Caryl Bis, MD   Recommendations at discharge:  Follow Urine culture results, adjust antibiotics as needed.  Patient needs to be fed. Encourage oral intake.     Discharge Diagnoses: Principal Problem:   Closed right hip fracture (HCC) Active Problems:   Hyperlipidemia   GERD (gastroesophageal reflux disease)   Hypertension   Diabetes mellitus ([place])   Dementia without behavioral disturbance (HCC)   Hypernatremia  Resolved Problems:   * No resolved hospital problems. *  Hospital Course: 71 year old with past medical history significant for severe dementia, diabetes type 2, hyperlipidemia, resided at memory care unit facility, had a fall, complaining of right hip pain.  CT of pelvis showed impacted right femoral neck fracture.  Orthopedic was consulted.  Patient underwent ORIF on 11/5.  Hemoglobin remained stable postoperative. Patient with poor oral intake.  Palliative care was consulted for goals of care conversation.  Plan is to continue with medical management.  Patient was supposed to be discharged 11/14 but facility was unable to accept patient after 8 PM.  B-met  check on 11/15 show a sodium of 156.   Assessment and Plan: 1-Hypernatremia;  In setting of poor oral intake.  Treated  with  D 5 IV fluids.  Encourage oral intake.  Resolved.  Sodium down to 145 At risk for recurrent hospitalization.    2-Closed Right hip fracture: Mechanical fall.  Orthopedic was consulted.  Patient underwent intramedullary nailing of the right intertrochanteric femoral Continue with DVT prophylaxis.  Pain management.   UTI;  UA with more than 50 WBC>  Started IV ceftriaxone, received 2 days. Discharge on Keflex for 4 days.  Follow urine culture.    Hypotension.  Respond to IV fluids.    Diabetes  type 2 with hyperglycemia: Not on oral hypoglycemic agents at home.  A1c 5.5   AKI: Creatinine baseline 0.5 on admission 1.1 Received IV fluids   Hyperlipidemia; Continue with statins.    GERD; Continue with PPI  Elevation BP; resolved after pain controlled.    Hypokalemia; Replaced.    Advanced dementia without behavioral disturbances. Patient with poor oral intake.  She was started on Marinol.  Palliative care consulted.  Family wants to continue with current care plan.         Estimated body mass index is 20.52 kg/m as calculated from the following:   Height as of this encounter: 5' 2.01" (1.575 m).   Weight as of this encounter: 50.9 kg.          Consultants: Ortho, Palliative Procedures performed: Sx Disposition: Skilled nursing facility Diet recommendation:  Discharge Diet Orders (From admission, onward)     Start     Ordered   07/15/22 0000  Regular diet.  07/15/22 1013   07/04/22 0000   07/04/22 0751           Regular diet DISCHARGE MEDICATION: Allergies as of 07/15/2022       Reactions   Flomax [tamsulosin Hcl] Swelling   JAWS   Sulfa Antibiotics Swelling        Medication List     STOP taking these medications    cefdinir 300 MG capsule Commonly known as: OMNICEF       TAKE these medications    acetaminophen 325 MG tablet Commonly known as: TYLENOL Take 1-2  tablets (325-650 mg total) by mouth every 6 (six) hours as needed for mild pain or moderate pain.   aspirin EC 325 MG tablet Take 1 tablet (325 mg total) by mouth daily.   atorvastatin 20 MG tablet Commonly known as: LIPITOR Take 40 mg by mouth at bedtime.   cephALEXin 500 MG capsule Commonly known as: KEFLEX Take 1 capsule (500 mg total) by mouth 3 (three) times daily for 4 days.   cholecalciferol 25 MCG (1000 UNIT) tablet Commonly known as: VITAMIN D3 Take 2,000 Units by mouth daily.   cyanocobalamin 500 MCG tablet Commonly known as: VITAMIN B12 Take 500 mcg by  mouth daily.   divalproex 125 MG DR tablet Commonly known as: DEPAKOTE Take 125-250 mg by mouth 3 (three) times daily. 1 tablet at 0800 and 1400, 2 tablets at bedtime (1900)   docusate sodium 100 MG capsule Commonly known as: COLACE Take 100 mg by mouth 2 (two) times daily.   dronabinol 2.5 MG capsule Commonly known as: MARINOL Take 1 capsule (2.5 mg total) by mouth 2 (two) times daily before lunch and supper.   famotidine 10 MG tablet Commonly known as: PEPCID Take 10 mg by mouth 2 (two) times daily.   fluticasone 50 MCG/ACT nasal spray Commonly known as: FLONASE Place 2 sprays into both nostrils daily.   HYDROcodone-acetaminophen 5-325 MG tablet Commonly known as: NORCO/VICODIN Take 1 tablet by mouth every 6 (six) hours as needed for severe pain.   mirtazapine 30 MG tablet Commonly known as: REMERON Take 30 mg by mouth at bedtime.   montelukast 10 MG tablet Commonly known as: SINGULAIR Take 10 mg by mouth at bedtime.   ondansetron 4 MG disintegrating tablet Commonly known as: ZOFRAN-ODT Take 4 mg by mouth every 8 (eight) hours as needed.   senna 8.6 MG Tabs tablet Commonly known as: SENOKOT Take 1 tablet (8.6 mg total) by mouth daily. Start taking on: July 16, 2022               Discharge Care Instructions  (From admission, onward)           Start     Ordered   07/15/22 0000  Discharge wound care:       Comments: See above   07/15/22 1013            Contact information for follow-up providers     Haddix, Thomasene Lot, MD. Schedule an appointment as soon as possible for a visit in 2 week(s).   Specialty: Orthopedic Surgery Why: for wound check and repeat x-rays Contact information: [place] [address] [phone]         Caryl Bis, MD Follow up.   Specialty: Family Medicine Contact information: [place] [address] [phone]         Palliative care. Schedule an appointment as soon as  possible for a visit.   Why: establish care to continue discussion about goals of care             Contact information for after-discharge care     [facility] .   Service: Skilled Nursing Contact information: [address] [place] [phone]                    Discharge Exam: Danley Danker Weights   07/02/22 0237 07/02/22 1215  Weight: 50.9 kg 50.9 kg   General;  NAD, pleasantly confuse  Condition at discharge: stable  The results of significant diagnostics from this hospitalization (including imaging, microbiology, ancillary and laboratory) are listed below for reference.   Imaging Studies: DG FEMUR PORT, MIN 2 VIEWS RIGHT  Result Date: 07/02/2022 CLINICAL DATA:  Postop. EXAM: RIGHT FEMUR PORTABLE 2 VIEW COMPARISON:  Preoperative imaging. FINDINGS: Intramedullary nail with distal locking and trans trochanteric screw fixation of proximal femur fracture. Recent postsurgical change includes air and edema in the joint space and soft tissues. Distal femur is intact. IMPRESSION: ORIF of proximal femur fracture. No immediate postoperative complication. Electronically Signed   By: Keith Rake M.D.   On: 07/02/2022 19:50   DG FEMUR, MIN 2 VIEWS RIGHT  Result Date: 07/02/2022 CLINICAL DATA:  Elective surgery. EXAM: RIGHT FEMUR 2 VIEWS COMPARISON:  Preoperative imaging. FINDINGS: Five fluoroscopic spot views of the right hip obtained in the operating room. Intramedullary nail with distal locking and trans trochanteric screw fixation no proximal femur fracture. Fluoroscopy time 54.3 seconds. Dose 5.71 mGy. IMPRESSION: Intraoperative fluoroscopy during right femoral intramedullary nail and trans trochanteric screw fixation. Electronically Signed   By: Keith Rake M.D.   On: 07/02/2022 14:50   DG C-Arm 1-60 Min-No Report  Result Date: 07/02/2022 Fluoroscopy was utilized by the  requesting physician.  No radiographic interpretation.   CT HIP RIGHT WO CONTRAST  Result Date: 07/01/2022 CLINICAL DATA:  Hip surgical planning. Femoral neck fracture on radiograph EXAM: CT OF THE RIGHT HIP WITHOUT CONTRAST TECHNIQUE: Multidetector CT imaging of the right hip was performed according to the standard protocol. Multiplanar CT image reconstructions were also generated. RADIATION DOSE REDUCTION: This exam was performed according to the departmental dose-optimization program which includes automated exposure control, adjustment of the mA and/or kV according to patient size and/or use of iterative reconstruction technique. COMPARISON:  Radiograph earlier today. FINDINGS: Bones/Joint/Cartilage Impacted right basicervical femoral neck fracture with mild apex anterior angulation. There is mild displacement. Femoral head is seated in the acetabulum. Mild osteoarthritis of the right hip. Pubic rami and included hemipelvis are intact. Ligaments Suboptimally assessed by CT. Muscles and Tendons Mild edema/hemorrhage in the obturator musculature. Soft tissues Subcutaneous edema laterally.  There is no intrapelvic free fluid. IMPRESSION: Impacted right basicervical femoral neck fracture with mild apex anterior angulation and mild displacement. Electronically Signed   By: Keith Rake M.D.   On: 07/01/2022 18:48   DG Hip Unilat W or Wo Pelvis 2-3 Views Right  Result Date: 07/01/2022 CLINICAL DATA:  Right-sided hip and thigh pain after fall. EXAM: DG HIP (WITH OR WITHOUT PELVIS) 2-3V RIGHT COMPARISON:  None. FINDINGS: Although limited by positioning, there is an impacted right femoral neck fracture. Mild rotation of the femur. The femoral head is seated. The bones are diffusely under mineralized. Mild bilateral hip degenerative change. Intact pubic rami. IMPRESSION: Impacted right femoral neck fracture. Electronically Signed   By: Keith Rake M.D.   On: 07/01/2022 15:45   CT Cervical Spine Wo  Contrast  Result Date: 07/01/2022 CLINICAL DATA:  Neck trauma (Age >= 65y) EXAM: CT CERVICAL SPINE WITHOUT CONTRAST TECHNIQUE: Multidetector CT imaging of the cervical spine was performed without intravenous contrast. Multiplanar CT image reconstructions were also generated. RADIATION DOSE REDUCTION: This exam was performed according to the departmental dose-optimization program which includes automated exposure control, adjustment of the mA and/or kV according to patient size and/or use of iterative reconstruction technique. COMPARISON:  Cervical spine CT 05/14/2022 FINDINGS: Alignment: No traumatic subluxation. Similar reversal of normal lordosis. Skull  base and vertebrae: No acute fracture. Vertebral body heights are maintained. The dens and skull base are intact. Soft tissues and spinal canal: No prevertebral fluid or swelling. No visible canal hematoma. Disc levels: Degenerative disc disease from C3-C4 through C6-C7, stable. Upper chest: No acute or unexpected findings. Other: None. IMPRESSION: Stable degenerative change in the cervical spine without acute fracture or subluxation. Electronically Signed   By: Keith Rake M.D.   On: 07/01/2022 15:41   CT Head Wo Contrast  Result Date: 07/01/2022 CLINICAL DATA:  Post fall. EXAM: CT HEAD WITHOUT CONTRAST TECHNIQUE: Contiguous axial images were obtained from the base of the skull through the vertex without intravenous contrast. RADIATION DOSE REDUCTION: This exam was performed according to the departmental dose-optimization program which includes automated exposure control, adjustment of the mA and/or kV according to patient size and/or use of iterative reconstruction technique. COMPARISON:  05/14/2022 FINDINGS: Brain: No acute intracranial hemorrhage. No subdural or extra-axial collection. Stable degree of atrophy and chronic small vessel ischemia. No evidence of acute infarct. No midline shift, mass effect or hydrocephalus. Vascular: Atherosclerosis of  skullbase vasculature without hyperdense vessel or abnormal calcification. Skull: No fracture or focal lesion. Sinuses/Orbits: Paranasal sinuses and mastoid air cells are clear. The visualized orbits are unremarkable. Other: No confluent scalp hematoma. IMPRESSION: 1. No acute intracranial abnormality. No skull fracture. 2. Stable atrophy and chronic small vessel ischemia. Electronically Signed   By: Keith Rake M.D.   On: 07/01/2022 15:38    Microbiology: Results for orders placed or performed during the hospital encounter of 07/01/22  Surgical pcr screen     Status: None   Collection Time: 07/02/22  2:07 AM  Result Value Ref Range Status   MRSA, PCR NEGATIVE NEGATIVE Final   Staphylococcus aureus NEGATIVE NEGATIVE Final    Comment: (NOTE) The Xpert SA Assay (FDA approved for NASAL specimens in patients 68 years of age and older), is one component of a comprehensive surveillance program. It is not intended to diagnose infection nor to guide or monitor treatment. Performed at [facility], [place] [address] [place], [place] [address]   Surgical pcr screen     Status: None   Collection Time: 07/02/22 12:07 PM   Specimen: Nasal Mucosa; Nasal Swab  Result Value Ref Range Status   MRSA, PCR NEGATIVE NEGATIVE Final   Staphylococcus aureus NEGATIVE NEGATIVE Final    Comment: (NOTE) The Xpert SA Assay (FDA approved for NASAL specimens in patients 26 years of age and older), is one component of a comprehensive surveillance program. It is not intended to diagnose infection nor to guide or monitor treatment. Performed at [facility], [place] [address] [place], [place] [address]     Labs: CBC: Recent Labs  Lab 07/13/22 0307 07/14/22 0451  WBC 12.6* 9.2  HGB 12.7 10.4*  HCT 40.1 34.7*  MCV 92.8 95.3  PLT 310 595   Basic Metabolic Panel: Recent Labs  Lab 07/09/22 0636 07/12/22 0814 07/13/22 0307 07/14/22 0451  NA  --  156* 149* 145  K  --  3.6 3.3* 3.8  CL  --   114* 109 108  CO2  --  _0 GLUCOSE  --  141* 146* 118*  BUN  --  55* 50* 33*  CREATININE 0.84 1.15* 1.05* 1.05*  CALCIUM  --  9.8 9.3 8.9  MG  --  2.4  --   --    Liver Function Tests: No results for input(s): "AST", "  ALT", "ALKPHOS", "BILITOT", "PROT", "ALBUMIN" in the last 168 hours. CBG: Recent Labs  Lab 07/14/22 0758 07/14/22 1156 07/14/22 1615 07/14/22 2013 07/15/22 0755  GLUCAP 135* 138* 119* 131* 141*    Discharge time spent: greater than 30 minutes.  Signed: Elmarie Shiley, MD [facility] 07/15/2022

## 2022-07-15 NOTE — Progress Notes (Signed)
Pt ws picked up by Corey Harold,  Nurse call report to Nurse [facility])  receiving Pt in RM [id number]. All belonging was given to transport a the time of pick up.

## 2022-07-15 NOTE — TOC Transition Note (Addendum)
Transition of Care [facility]) - CM/SW Discharge Note   Patient Details  Name: [name] MRN: [id number] Date of Birth: [birth year]  Transition of Care [facility]) CM/SW Contact:  Milas Gain, [place] Phone Number: 07/15/2022, 12:17 PM   Clinical Narrative:     Update- Misty called CSW back with [facility] who confirmed palliative referral was received for patient.  CSW spoke with Bryson Ha at [facility] who confirmed they can accept patient today for dc. CSW informed MD. CSW received consult for palliative services to follow patient at SNF. CSW spoke with patients sister [name] who gave CSW permission to make referral to [facility] for palliative services to follow patient at SNF. CSW made referral to [place] with [facility] for palliative services to follow patient at [facility].  Patient will DC to: [place] date: 07/15/2022  Family notified: [name]  Transport by: Corey Harold  ?  Per MD patient ready for DC to [facility] . RN, patient, patient's family, Fabio Pierce and Charlena Cross with [facility] facility notified of DC. Discharge Summary sent to facility. RN given number for report tele# [phone] RM# [id number]. DC packet on chart. DNR signed by MD attached to patients DC packet.Ambulance transport requested for patient.  CSW signing off.   Final next level of care: [place] Barriers to Discharge: Barriers Resolved   Patient Goals and CMS Choice Patient states their goals for this hospitalization and ongoing recovery are:: To return to memory care   Choice offered to / list presented to : Sibling (sister [name], who reports she is POA)  Discharge Placement              Patient chooses bed at:  [facility]) Patient to be transferred to facility by: [place] Name of family member notified: left message with [place] Patient and family notified of of transfer: 07/12/22  Discharge Plan and Services In-house Referral: Clinical Social Work   Post  Acute Care Choice: [place]                               Social Determinants of Health (SDOH) Interventions     Readmission Risk Interventions     No data to display

## 2022-07-15 NOTE — Progress Notes (Signed)
   Palliative Medicine Inpatient Follow Up Note HPI: 71 y.o. female  with past medical history of severe dementia, type 2 diabetes, hyperlipidemia, GERD, and depression  admitted on 07/01/2022 with fall. X-ray and CT of her hip show impacted right femoral neck fracture; S/p surgical repair. Patient with significant loss of function and poor PO intake. PMT consulted to discuss [facility].    RN at bedside has been with patient for 3 days in a row and reports multiple attempts at PO intake with very little success at getting patient to take in liquids or solids. I also sat at bedside and attempted to feed patient with no success.    Today's Discussion 07/15/2022  *Please note that this is a verbal dictation therefore any spelling or grammatical errors are due to the "[place] One" system interpretation.  Chart reviewed inclusive of vital signs, progress notes, laboratory results, and diagnostic images. Improvements in sodium. Ate 25% of breakfast.  I met with Onnie this morning at bedside. She was awake and oriented to self. Does not appear to be in any pain this morning.  I spoke with patients RN, Orie Fisherman who shares no concerns about patient.  Reviewed the plan for transition to [facility] later today.   Questions answered and support provided.   Objective Assessment: Vital Signs Vitals:   07/15/22 0347 07/15/22 0900  BP: 106/63 95/76  Pulse: 68 95  Resp:  18  Temp: 97.6 F (36.4 C) 98.6 F (37 C)  SpO2: 98% 100%    Intake/Output Summary (Last 24 hours) at 07/15/2022 1405 Last data filed at 07/15/2022 0800 Gross per 24 hour  Intake 50 ml  Output 1 ml  Net 49 ml    Last Weight  Most recent update: 07/02/2022 12:16 PM    Weight  50.9 kg (112 lb 3.4 oz)            Gen:  Frail elderly F in NAD HEENT: Dry mucous membranes CV: Regular rate and rhythm  PULM: On RA, breathing is even and nonlabored ABD: soft/nontender  EXT: No edema  Neuro: Alert to  self  SUMMARY OF RECOMMENDATIONS   DNAR/DNI  Family not presently interested in Hospice care  or OP Palliative support  Continue  low dose [place] for appetite support seems to have had gradual increase in intake  Plan for Rehab placement today at [facility] based on MDM: Moderate  _____________________________________________________________________________________ [place] Team Team Cell Phone: [phone] Please utilize secure chat with additional questions, if there is no response within 30 minutes please call the above phone number  Palliative Medicine Team providers are available by phone from 7am to 7pm daily and can be reached through the team cell phone.  Should this patient require assistance outside of these hours, please call the patient's attending physician.

## 2022-07-16 LAB — URINE CULTURE: Culture: >=100000 — AB

## 2022-07-26 ENCOUNTER — Encounter: Payer: Self-pay | Admitting: Family Medicine

## 2022-07-26 ENCOUNTER — Non-Acute Institutional Stay: Payer: Medicare PPO | Admitting: Family Medicine

## 2022-07-26 VITALS — BP 92/50 | HR 98 | Resp 20 | Wt 111.2 lb

## 2022-07-26 DIAGNOSIS — Z7189 Other specified counseling: Secondary | ICD-10-CM

## 2022-07-26 DIAGNOSIS — R634 Abnormal weight loss: Secondary | ICD-10-CM

## 2022-07-26 DIAGNOSIS — E860 Dehydration: Secondary | ICD-10-CM

## 2022-07-26 DIAGNOSIS — Z7409 Other reduced mobility: Secondary | ICD-10-CM

## 2022-07-26 DIAGNOSIS — F0390 Unspecified dementia without behavioral disturbance: Secondary | ICD-10-CM

## 2022-07-26 NOTE — Progress Notes (Signed)
[facility] Consult Note Telephone: [phone]  Fax: [phone]   Date of encounter: 07/26/22 9:32 AM PATIENT NAME: [name] [address] [place] [address]   [phone] (home)  DOB: 1951/08/05 MRN: [id number] PRIMARY CARE PROVIDER:    Caryl Bis, MD,  [address] [place] [address] [phone]  REFERRING PROVIDER:   Caryl Bis, MD [address] [place],  [place] [address] [phone]  RESPONSIBLE PARTY:    Contact Information     Name Relation Home Work Mobile   Buddy Duty Sister   [phone]   Bevelyn Ngo   [phone]   Radonna Ricker   [phone]        I met face to face with patient in skilled nursing facility. Palliative Care was asked to follow this patient by consultation request of  Caryl Bis, MD to address advance care planning and complex medical decision making. This is the initial visit.          ASSESSMENT, SYMPTOM MANAGEMENT AND PLAN / RECOMMENDATIONS:   Limited Mobility Can stand with max assist, doesn't put much weight on RLE per PT. Even with max cueing pt's cognitive status is a barrier to resuming ambulation and retaining instructions for safe ambulation/transfers.   Dementia without behavioral disturbance FAST 7 score 7b Doubt pt's ability to successfully rehab after recent fracture and with poor nutrition, likely at best she will be chairbound/bedbound with increased risk for skin breakdown, pneumonia and infection. Severe failure to thrive.   Abnormal weight loss/dehydration Weight loss of 20 lbs with multiple electrolyte derangements and poor comprehension/ability to follow through with improving intake. Has been on Remeron 30 mg daily even prior to recent hospitalization and fracture with continued weight loss. No significant improvement with addition of Marinol and pt requires max cueing to eat or drink. Recommend nutrition consult to advise maximization  of protein and fluid content.   Goals of Care counseling/discussion Advised pt's sister by phone of general progression of Dementia with cognitive barriers to rehab, poor intake and dehydration with nutritional deficiencies. Pt's sister does not believe that this is a significant change from her prior function and wants to continue current therapy.   Follow up Palliative Care Visit: Palliative care will continue to follow for complex medical decision making, advance care planning, and clarification of goals. Return 4 weeks or prn.    This visit was coded based on medical decision making (MDM).  PPS: 30%  HOSPICE ELIGIBILITY/DIAGNOSIS:  Advanced dementia with severe failure to thrive, dehydration and weight loss.  Family currently not ready to consider Hospice.  Chief Complaint:  [name] received a referral to follow up with patient for chronic disease management in setting of dementia. Pt with recent hospitalization for closed right hip fracture following a fall, UTI, AKI with dehydration.   HISTORY OF PRESENT ILLNESS:  [name] is a 71 y.o. year old female with Dementia, HTN, DM,right hip fracture s/p ORIF, recent dehydration/AKI and multiple electrolyte derangements.  On 07/11/22 she was started on ASA PE/VTE prophylaxis of 325 mg daily x 30 days. Pt had ED visit for fall 05/14/22 with CT head and cervical spine negative for fracture.  On 07/01/22 pt was hospitalized after a fall for closed right hip fracture, UTI and AKI with dehydration.  She  had ORIF right intertrochanteric  femoral neck fracture.  She was treated with IV Ceftriaxone x 2 days and IV hydration then completed  treatment for UTI with Keflex for 4 days. She was started on Marinol to improve her appetite. She is working with PT who states pt is max assist for bed mobility, can stand but with max cueing and assist with challenges to follow directions due to poor cognition and retention.  Nursing  states pt is total care for all ADLs and requires max cueing to eat and drink fluids with limited intake. She has not noted coughing and choking with intake.  Nursing reports verbal communication is non-sensical. Pt was admitted to inpatient stay on Remeron 30 mg QHS. On approach pt smiled and responded to say "Hello".  She said "yes" to every question including was the [facility].  Appears comfortable.  Spoke with pt's sister and HC POA Building services engineer by phone.  She states that pt was ambulatory prior to fall and was interactive with family.  She advised that pt has never been a huge eater and has always struggled to drink enough liquids.  Advised of normal progression of illness to eventually not eating or drinking and that despite appetite stimulants pt has last 20 lbs in last 2 months.  Advised that given her cognitive deficits and need for maximum cueing to do any activities it will be difficult to get her to understand what is needed to successfully get her ambulatory again.  Advised that not being ambulatory and being bed or chairbound her risks of infection and skin breakdown increase significantly.  Also advised of recent AKI and electrolyte derangements from poor nutrition and hydration.  Also informed that normally when there is an insult like infection or surgery that the patient demonstrates a decline and may not get back to prior baseline but continue to decline from there.  Discussed possible hospice referral that would not change plan of care other than to eliminate therapy but currently sister wants to continue. Advised if she changes her mind that we can re-evaluate at any time.  History obtained from review of EMR, discussion with facility staff and/or [name].   07/01/22 HGB A1c 5.5% on no medications    Latest Ref Rng & Units 07/14/2022    4:51 AM 07/13/2022    3:07 AM 07/04/2022    4:27 AM  CBC  WBC 4.0 - 10.5 K/uL 9.2  12.6  7.4   Hemoglobin 12.0 - 15.0 g/dL 10.4   12.7  11.1   Hematocrit 36.0 - 46.0 % 34.7  40.1  33.2   Platelets 150 - 400 K/uL 236  310  132       Latest Ref Rng & Units 07/14/2022    4:51 AM 07/13/2022    3:07 AM 07/12/2022    8:14 AM  CMP  Glucose 70 - 99 mg/dL 118  146  141   BUN 8 - 23 mg/dL 33  50  55   Creatinine 0.44 - 1.00 mg/dL 1.05  1.05  1.15   Sodium 135 - 145 mmol/L 145  149  156   Potassium 3.5 - 5.1 mmol/L 3.8  3.3  3.6   Chloride 98 - 111 mmol/L 108  109  114   CO2 22 - 32 mmol/L _0 Calcium 8.9 - 10.3 mg/dL 8.9  9.3  9.8    07/01/22 CT right hip wo contrast: FINDINGS: Bones/Joint/Cartilage   Impacted right basicervical femoral neck fracture with mild apex anterior angulation. There is mild displacement. Femoral head is seated in the acetabulum. Mild  osteoarthritis of the right hip. Pubic rami and included hemipelvis are intact.   Ligaments   Suboptimally assessed by CT.   Muscles and Tendons   Mild edema/hemorrhage in the obturator musculature.   Soft tissues   Subcutaneous edema laterally.  There is no intrapelvic free fluid.   IMPRESSION: Impacted right basicervical femoral neck fracture with mild apex anterior angulation and mild displacement.   07/01/22 CT head wo contrast: FINDINGS: Brain: No acute intracranial hemorrhage. No subdural or extra-axial collection. Stable degree of atrophy and chronic small vessel ischemia. No evidence of acute infarct. No midline shift, mass effect or hydrocephalus.   Vascular: Atherosclerosis of skullbase vasculature without hyperdense vessel or abnormal calcification.   Skull: No fracture or focal lesion.   Sinuses/Orbits: Paranasal sinuses and mastoid air cells are clear. The visualized orbits are unremarkable.   Other: No confluent scalp hematoma.   IMPRESSION: 1. No acute intracranial abnormality. No skull fracture. 2. Stable atrophy and chronic small vessel ischemia.  07/01/22 CT cervical spine wo contrast: IMPRESSION: Stable  degenerative change in the cervical spine without acute fracture or subluxation.    I reviewed EMR for available labs, medications, imaging, studies and related documents.  Records reviewed and summarized above.   ROS Unable to obtain from pt with advanced dementia  Physical Exam: Current and past weights: 05/14/22 weight 132 lbs, On 07/02/22 weight was 112 lbs 3.4 oz,  07/21/22 weight was 111 lbs 3.2 oz Constitutional: NAD General: frail appearing, thin ENMT: intact hearing CV: S1S2, RRR, no LE edema Pulmonary: CTAB, no increased work of breathing, no cough, room air Abdomen: normo-active BS + 4 quadrants, soft and non tender GU: deferred MSK: noted sarcopenia with atrophy of RLE, moves all extremities, WBAT with  max assist Skin: warm and dry, right hip incision clean, dry and intact.  Noted brown stain to nails of right hand Neuro:  no generalized weakness, non-sensical speech/word salad Psych: non-anxious affect, Confused Hem/lymph/immuno: no widespread bruising  CURRENT PROBLEM LIST:  Patient Active Problem List   Diagnosis Date Noted   Hypernatremia 07/14/2022   Dementia without behavioral disturbance ([facility]) 07/01/2022   Closed right hip fracture ([place]) 07/01/2022   Acute cystitis without hematuria    Acute metabolic encephalopathy 62/37/6283   Nausea and vomiting 09/12/2011   Hypercalcemia 09/12/2011   UTI (lower urinary tract infection) 09/12/2011   Anastomotic leak of biliary tree 08/26/2011   Diabetes mellitus ([place]) 08/21/2011   Hypokalemia 08/21/2011   Tachycardia 08/21/2011   Biliary sludge 08/21/2011   Hyperlipidemia    GERD (gastroesophageal reflux disease)    Hypertension    PAST MEDICAL HISTORY:  Active Ambulatory Problems    Diagnosis Date Noted   Hyperlipidemia    GERD (gastroesophageal reflux disease)    Hypertension    Diabetes mellitus ([place]) 08/21/2011   Hypokalemia 08/21/2011   Tachycardia 08/21/2011   Biliary sludge 08/21/2011   Anastomotic  leak of biliary tree 08/26/2011   Nausea and vomiting 09/12/2011   Hypercalcemia 09/12/2011   UTI (lower urinary tract infection) 15/17/6160   Acute metabolic encephalopathy 73/71/0626   Acute cystitis without hematuria    Dementia without behavioral disturbance ([place]) 07/01/2022   Closed right hip fracture ([place]) 07/01/2022   Hypernatremia 07/14/2022   Resolved Ambulatory Problems    Diagnosis Date Noted   No Resolved Ambulatory Problems   Past Medical History:  Diagnosis Date   Arthritis    Dementia ([facility] [facility])    Depression    Headache(784.0)    History of  hypertension    History of kidney stones    Hydronephrosis, right    Renal calculi    Type 2 diabetes mellitus (HCC)    Urge urinary incontinence    Wears glasses    SOCIAL HX:  Social History   Tobacco Use   Smoking status: Never   Smokeless tobacco: Never  Substance Use Topics   Alcohol use: No   FAMILY HX:  Family History  Problem Relation Age of Onset   Dementia Father    Anesthesia problems Neg Hx    Hypotension Neg Hx    Malignant hyperthermia Neg Hx    Pseudochol deficiency Neg Hx        Preferred Pharmacy: ALLERGIES:  Allergies  Allergen Reactions   Flomax [Tamsulosin Hcl] Swelling    JAWS   Sulfa Antibiotics Swelling     PERTINENT MEDICATIONS:  Outpatient Encounter Medications as of 07/26/2022  Medication Sig   acetaminophen (TYLENOL) 325 MG tablet Take 1-2 tablets (325-650 mg total) by mouth every 6 (six) hours as needed for mild pain or moderate pain.   aspirin EC 325 MG tablet Take 1 tablet (325 mg total) by mouth daily.   atorvastatin (LIPITOR) 20 MG tablet Take 40 mg by mouth at bedtime.   cholecalciferol (VITAMIN D3) 25 MCG (1000 UNIT) tablet Take 2,000 Units by mouth daily.   divalproex (DEPAKOTE) 125 MG DR tablet Take 125-250 mg by mouth 3 (three) times daily. 1 tablet at 0800 and 1400, 2 tablets at bedtime (1900)   docusate sodium (COLACE) 100 MG capsule Take 100 mg by mouth 2 (two) times  daily.   dronabinol (MARINOL) 2.5 MG capsule Take 1 capsule (2.5 mg total) by mouth 2 (two) times daily before lunch and supper.   famotidine (PEPCID) 10 MG tablet Take 10 mg by mouth 2 (two) times daily.   fluticasone (FLONASE) 50 MCG/ACT nasal spray Place 2 sprays into both nostrils daily.   HYDROcodone-acetaminophen (NORCO/VICODIN) 5-325 MG tablet Take 1 tablet by mouth every 6 (six) hours as needed for severe pain.   mirtazapine (REMERON) 30 MG tablet Take 30 mg by mouth at bedtime.   montelukast (SINGULAIR) 10 MG tablet Take 10 mg by mouth at bedtime.   ondansetron (ZOFRAN-ODT) 4 MG disintegrating tablet Take 4 mg by mouth every 8 (eight) hours as needed.   senna (SENOKOT) 8.6 MG TABS tablet Take 1 tablet (8.6 mg total) by mouth daily.   vitamin B-12 (CYANOCOBALAMIN) 500 MCG tablet Take 500 mcg by mouth daily.   No facility-administered encounter medications on file as of 07/26/2022.     ------------------------------------------------------------------------------------ Advance Care Planning/Goals of Care: Goals include to maximize quality of life and symptom management.  Identification  of a healthcare agent  Review of existing advance directive documents-DNR and MOST  Decision not to resuscitate or to de-escalate disease focused treatments due to poor prognosis. CODE STATUS: DNR MOST as of 07/15/22:     Thank you for the opportunity to participate in the care of Ms. Trias.  The palliative care team will continue to follow. Please call our office at [phone] if we can be of additional assistance.   Marijo Conception, FNP-C  COVID-19 PATIENT SCREENING TOOL Asked and negative response unless otherwise noted:  Have you had symptoms of covid, tested positive or been in contact with someone with symptoms/positive test in the past 5-10 days? No

## 2022-07-29 ENCOUNTER — Encounter: Payer: Self-pay | Admitting: Family Medicine

## 2022-07-29 DIAGNOSIS — Z7189 Other specified counseling: Secondary | ICD-10-CM | POA: Insufficient documentation

## 2022-07-29 DIAGNOSIS — R634 Abnormal weight loss: Secondary | ICD-10-CM | POA: Insufficient documentation

## 2022-07-29 DIAGNOSIS — E860 Dehydration: Secondary | ICD-10-CM | POA: Insufficient documentation

## 2022-07-29 DIAGNOSIS — Z7409 Other reduced mobility: Secondary | ICD-10-CM | POA: Insufficient documentation

## 2022-10-23 ENCOUNTER — Non-Acute Institutional Stay: Payer: Medicare PPO | Admitting: Family Medicine

## 2022-10-23 VITALS — BP 128/74 | HR 68 | Temp 97.2°F | Resp 18 | Wt 96.0 lb

## 2022-10-23 DIAGNOSIS — Z7401 Bed confinement status: Secondary | ICD-10-CM

## 2022-10-23 DIAGNOSIS — F0390 Unspecified dementia without behavioral disturbance: Secondary | ICD-10-CM

## 2022-10-23 DIAGNOSIS — R634 Abnormal weight loss: Secondary | ICD-10-CM

## 2022-10-23 NOTE — Progress Notes (Signed)
[facility] Consult Note Telephone: [phone]  Fax: [phone]   Date of encounter: 10/23/22 11:32 AM PATIENT NAME: [name] [address] [place] [address]   [phone] (home)  DOB: 1950-10-04 MRN: [id number] PRIMARY CARE PROVIDER:    Caryl Bis, MD,  [address] [place] [address] [phone]  REFERRING PROVIDER:   Caryl Bis, MD [place],  [place] [address] [phone]  Emergency Contact: Contact Information     Name Relation Home Work [place] Sister   [phone]   Bevelyn Ngo   [phone]   Radonna Ricker   [phone]        I met face to face with patient in [facility] and [facility]. Pt is LTC skilled nursing patient. Palliative Care was asked to follow this patient by consultation request of Caryl Bis, MD to address advance care planning and complex medical decision making. This is a follow up visit.       [facility] Consult Note Telephone: [phone]  Fax: [phone]    Date of encounter: 07/26/22 9:32 AM PATIENT NAME: [name] [address] [place] [address]   [phone] (home)  DOB: 17-Feb-1951 MRN: [id number] PRIMARY CARE PROVIDER:    Caryl Bis, MD,  [address] [place] [address] [phone]   REFERRING PROVIDER:   Caryl Bis, MD [address] [place],  [place] [address] [phone]   RESPONSIBLE PARTY:    Contact Information       Name Relation Home Work Mobile    Buddy Duty Sister     [phone]    Bevelyn Ngo     [place] Sister         Code status: [facility] POA:  Starling Manns, sister  I met face to face with patient in skilled nursing facility. Palliative Care was asked to follow this patient by consultation request of  Caryl Bis, MD to address advance care planning and complex medical decision making. This is a follow up  visit.   ASSESSMENT AND / RECOMMENDATIONS:  PPS: 30%   Abnormal weight loss Weight loss of 15 lbs 3.2 oz in 3 months despite appetite stimulation. Recommend continued dysphagia pureed diet with thin liquids, started on pudding with all meals 09/28/22. Continue med pass QID, Ensure between meals and ice cream with meals. Discuss concern for potential decline in next 6 months with family to discuss goals of care/patient decline.   Dementia without behavioral disturbance FAST 7 score 7a (inability to ambulate due to fracture) Not on any meds for dementia   Bedbound Secondary to hip fracture in fall and inability to rehabilitate likely in part due to advanced dementia. Recommend frequent position change to prevent skin breakdown.  Follow up Palliative Care Visit:  Palliative Care continuing to follow up by monitoring for changes in appetite, weight, functional and cognitive status for chronic disease progression and management in agreement with patient's stated goals of care. Next visit in 4 weeks or prn.  This visit was coded based on medical decision making (MDM).  Chief Complaint  [facility] continues to follow up with patient for chronic disease management in setting of dementia.   HISTORY OF PRESENT ILLNESS: [name] is a 72 y.o. year old female with dementia who had a fall with resultant right hip fracture s/p ORIF last fall, UTI and AKI  with dehydration.  When asked about pain she states "yes" and when asked to tell me where or show me she states "just right here" but keeps her hands crossed in her lap.  She falls asleep without continued stimulation, is total care and incontinent of bowel and bladder.  Per review of records pt had been on 2-3 months worth of Remeron and Marinol with no improvement in appetite or weight.   ACTIVITIES OF DAILY LIVING: INCONTINENT OF BOWEL/BLADDER  MOBILITY:   BEDBOUND  APPETITE? Poor WEIGHT: 96 lbs as of 10/23/22,  prior weight 111 lbs 3.2 oz on 07/21/22  CURRENT PROBLEM LIST:  Patient Active Problem List   Diagnosis Date Noted   Limited mobility 07/29/2022   Abnormal weight loss 07/29/2022   Dehydration 07/29/2022   Goals of care, counseling/discussion 07/29/2022   Hypernatremia 07/14/2022   Dementia without behavioral disturbance ([place]) 07/01/2022   Closed right hip fracture ([place]) 07/01/2022   Acute cystitis without hematuria    Acute metabolic encephalopathy 123XX123   Nausea and vomiting 09/12/2011   Hypercalcemia 09/12/2011   UTI (lower urinary tract infection) 09/12/2011   Anastomotic leak of biliary tree 08/26/2011   Diabetes mellitus ([place]) 08/21/2011   Hypokalemia 08/21/2011   Tachycardia 08/21/2011   Biliary sludge 08/21/2011   Hyperlipidemia    GERD (gastroesophageal reflux disease)    Hypertension    PAST MEDICAL HISTORY:  Active Ambulatory Problems    Diagnosis Date Noted   Hyperlipidemia    GERD (gastroesophageal reflux disease)    Hypertension    Diabetes mellitus ([place]) 08/21/2011   Hypokalemia 08/21/2011   Tachycardia 08/21/2011   Biliary sludge 08/21/2011   Anastomotic leak of biliary tree 08/26/2011   Nausea and vomiting 09/12/2011   Hypercalcemia 09/12/2011   UTI (lower urinary tract infection) 123XX123   Acute metabolic encephalopathy 123XX123   Acute cystitis without hematuria    Dementia without behavioral disturbance ([place]) 07/01/2022   Closed right hip fracture ([place]) 07/01/2022   Hypernatremia 07/14/2022   Limited mobility 07/29/2022   Abnormal weight loss 07/29/2022   Dehydration 07/29/2022   Goals of care, counseling/discussion 07/29/2022   Resolved Ambulatory Problems    Diagnosis Date Noted   No Resolved Ambulatory Problems   Past Medical History:  Diagnosis Date   Arthritis    Dementia ([place])    Depression    Headache(784.0)    History of hypertension    History of kidney stones    Hydronephrosis, right    Renal calculi    Type 2  diabetes mellitus ([place])    Urge urinary incontinence    Wears glasses    Preferred Pharmacy: [facility]   ALLERGIES:  Allergies  Allergen Reactions   Flomax [Tamsulosin Hcl] Swelling    JAWS   Sulfa Antibiotics Swelling     PERTINENT MEDICATIONS:  Outpatient Encounter Medications as of 10/23/2022  Medication Sig   aspirin EC 325 MG tablet Take 325 mg by mouth daily.   cholecalciferol (VITAMIN D3) 25 MCG (1000 UNIT) tablet Take 2,000 Units by mouth daily.   docusate (COLACE) 50 MG/5ML liquid Take 100 mg by mouth 2 (two) times daily.   famotidine (PEPCID) 10 MG tablet Take 10 mg by mouth 2 (two) times daily.   fluticasone (FLONASE) 50 MCG/ACT nasal spray Place 2 sprays into both nostrils daily.   montelukast (SINGULAIR) 10 MG tablet Take 10 mg by mouth at bedtime.   Nutritional Supplement LIQD Take 1 each by mouth 4 (four) times daily. Med Pass 4 times  a day, Ensure between meals and ice cream with meals for weight loss   vitamin B-12 (CYANOCOBALAMIN) 500 MCG tablet Take 500 mcg by mouth daily.   acetaminophen (TYLENOL) 325 MG tablet Take 1-2 tablets (325-650 mg total) by mouth every 6 (six) hours as needed for mild pain or moderate pain. (Patient not taking: Reported on 10/29/2022)   HYDROcodone-acetaminophen (NORCO/VICODIN) 5-325 MG tablet Take 1 tablet by mouth every 6 (six) hours as needed for severe pain. (Patient not taking: Reported on 10/29/2022)   ondansetron (ZOFRAN-ODT) 4 MG disintegrating tablet Take 4 mg by mouth every 8 (eight) hours as needed. (Patient not taking: Reported on 10/29/2022)   [DISCONTINUED] atorvastatin (LIPITOR) 20 MG tablet Take 40 mg by mouth at bedtime. (Patient not taking: Reported on 10/29/2022)   [DISCONTINUED] divalproex (DEPAKOTE) 125 MG DR tablet Take 125-250 mg by mouth 3 (three) times daily. 1 tablet at 0800 and 1400, 2 tablets at bedtime (1900)   [DISCONTINUED] dronabinol (MARINOL) 2.5 MG capsule Take 1 capsule (2.5 mg total) by mouth 2 (two) times  daily before lunch and supper.   [DISCONTINUED] mirtazapine (REMERON) 30 MG tablet Take 30 mg by mouth at bedtime.   [DISCONTINUED] senna (SENOKOT) 8.6 MG TABS tablet Take 1 tablet (8.6 mg total) by mouth daily.   No facility-administered encounter medications on file as of 10/23/2022.    History obtained from review of EMR, discussion with facility staff/caregiver and/or patient.    I reviewed available labs, medications, imaging, studies and related documents from the EMR.  There were no new records/imaging since last visit.   Physical Exam: GENERAL: NAD LUNGS: CTAB, no increased work of breathing, room air CARDIAC:  S1S2, RRR with no MRG, No edema/cyanosis ABD:  Normo-active BS x 4 quads, soft, non-tender EXTREMITIES: bedbound-can be out of bed to chair with lift, Yes muscle atrophy/subcutaneous fat loss in BLE NEURO:  Generalized weakness/non-weightbearing, significant cognitive impairment with aphasia, noted word salad PSYCH:  non-anxious affect, Alert, disoriented  Thank you for the opportunity to participate in the care of Altamese Warba. Please call our main office at [phone] if we can be of additional assistance.    Damaris Hippo FNP-C  Zahraa [email or web address] .[facility]  Phone:  [phone]

## 2022-10-25 ENCOUNTER — Telehealth: Payer: Self-pay | Admitting: Family Medicine

## 2022-10-26 ENCOUNTER — Encounter: Payer: Self-pay | Admitting: Radiology

## 2022-10-29 ENCOUNTER — Encounter: Payer: Self-pay | Admitting: Family Medicine

## 2022-10-29 DIAGNOSIS — Z7401 Bed confinement status: Secondary | ICD-10-CM | POA: Insufficient documentation

## 2022-10-29 NOTE — Telephone Encounter (Signed)
10/25/22 2:42 pm Spoke with pt's sister and HC POA, Starling Manns by phone to discuss follow up with patient.  Advised that pt continues to lose weight and is bedbound increasing her risk of skin breakdown and potential infection with her incontinence.  Advised this was despite appetite stimulation and supplements. Sister states patient is about the same as she has been since coming to the facility.  Advised that these signs indicated a general decline in her condition and failure to thrive which may make her eligible for increased supportive care with more frequent assessment and visits through Hospice services.  Sister indicates for now she would like to remain on Palliative Care.  Damaris Hippo FNP-C

## 2022-12-28 ENCOUNTER — Encounter: Payer: Self-pay | Admitting: Family Medicine

## 2022-12-28 ENCOUNTER — Non-Acute Institutional Stay: Payer: Medicare PPO | Admitting: Family Medicine

## 2022-12-28 VITALS — BP 160/68 | HR 98 | Temp 97.5°F | Resp 14

## 2022-12-28 DIAGNOSIS — F0390 Unspecified dementia without behavioral disturbance: Secondary | ICD-10-CM

## 2022-12-28 DIAGNOSIS — R634 Abnormal weight loss: Secondary | ICD-10-CM

## 2022-12-28 NOTE — Progress Notes (Signed)
Therapist, nutritional Palliative Care Consult Note Telephone: [phone]  Fax: [phone]   Date of encounter: 12/28/22 5:38 PM PATIENT NAME: [name] [address] [place] [address]   [phone] (home)  DOB: [birth year] MRN: [id number] PRIMARY CARE PROVIDER:    Richardean Chimera, MD,  [address] [place] [address] [phone]  REFERRING PROVIDER:   Richardean Chimera, MD [address] [place],  [place] [address] [phone]  Emergency Contact:    Contact Information     Name Relation Home Work Ida Sister   [phone]   Lyndal Pulley   [phone]   Josetta Huddle   [phone]       Health Care POA/Health Care Agent   I met face to face with patient in [facility] facility. Palliative Care was asked to follow this patient by consultation request of Angela Chimera, MD to address advance care planning and complex medical decision making. This is a follow up visit.    Review of an advance directive document-MOST.  CODE STATUS: MOST as of 07/15/22: DNR/DNI with comfort measures.  Do not hospitalize. Use of antibiotics and IV fluids on a case by case, time limited basis No feeding tube.   ASSESSMENT AND / RECOMMENDATIONS:  PPS: 30%  Dementia without behavioral disturbance FAST 7 score 7b Monitor for possibly dysphagia with coughing/choking after eating/drinking. Recommend oral care BID and observing for pocketing of food/fluids. Rediscuss goals of care with sister [name] who is [facility] POA-pt likely appropriate for Hospice level of care presently.   Abnormal weight loss Has lost weigh despite use of appetite stimulants Remeron and Marinol in the past. Continue Med pass as pt is compliant, increase Ensure to BID and continue ice cream/smoothies with protein supplement   Follow up Palliative Care Visit:  Palliative Care continuing to follow up by monitoring for changes in appetite, weight, functional  and cognitive status for chronic disease progression and management in agreement with patient's stated goals of care. Next visit in 3-4 weeks or prn.  This visit was coded based on medical decision making (MDM).  Chief Complaint  Palliative Care is continuing to follow pt for chronic medical management in setting of dementia with weight loss.  HISTORY OF PRESENT ILLNESS: [name] is a 72 y.o. year old female with dementia and weight loss.  She is unable to provide any hx and makes only non-sensical conversation.  She is seen sitting up in the wheelchair in the hall. Per facility staff she is on medpass which she will drink well.  Diet is listed as advanced dysphagia diet. Pt showed no significant improvement with Remeron or Marinol used previously.   ACTIVITIES OF DAILY LIVING: CONTINENT OF BLADDER/ BOWEL? No BATHING/DRESSING/FEEDING: Requires assist   MOBILITY:   WHEELCHAIR/bedbound   APPETITE? poor WEIGHT:  99.2 lb on 12/20/22, weight of 111 lbs 3.2 oz in Nov 2023  CURRENT PROBLEM LIST:  Patient Active Problem List   Diagnosis Date Noted   Bedbound 10/29/2022   Limited mobility 07/29/2022   Abnormal weight loss 07/29/2022   Dehydration 07/29/2022   Goals of care, counseling/discussion 07/29/2022   Hypernatremia 07/14/2022   Dementia without behavioral disturbance (HCC) 07/01/2022   Closed right hip fracture (HCC) 07/01/2022   Acute cystitis without hematuria    Acute metabolic encephalopathy 01/26/2021   Nausea and vomiting 09/12/2011   Hypercalcemia 09/12/2011   UTI (lower urinary tract infection) 09/12/2011   Anastomotic leak of  biliary tree 08/26/2011   Diabetes mellitus (HCC) 08/21/2011   Hypokalemia 08/21/2011   Tachycardia 08/21/2011   Biliary sludge 08/21/2011   Hyperlipidemia    GERD (gastroesophageal reflux disease)    Hypertension    PAST MEDICAL HISTORY:  Active Ambulatory Problems    Diagnosis Date Noted   Hyperlipidemia    GERD (gastroesophageal  reflux disease)    Hypertension    Diabetes mellitus (HCC) 08/21/2011   Hypokalemia 08/21/2011   Tachycardia 08/21/2011   Biliary sludge 08/21/2011   Anastomotic leak of biliary tree 08/26/2011   Nausea and vomiting 09/12/2011   Hypercalcemia 09/12/2011   UTI (lower urinary tract infection) 09/12/2011   Acute metabolic encephalopathy 01/26/2021   Acute cystitis without hematuria    Dementia without behavioral disturbance (HCC) 07/01/2022   Closed right hip fracture (HCC) 07/01/2022   Hypernatremia 07/14/2022   Limited mobility 07/29/2022   Abnormal weight loss 07/29/2022   Dehydration 07/29/2022   Goals of care, counseling/discussion 07/29/2022   Bedbound 10/29/2022   Resolved Ambulatory Problems    Diagnosis Date Noted   No Resolved Ambulatory Problems   Past Medical History:  Diagnosis Date   Arthritis    Dementia (HCC)    Depression    Headache(784.0)    History of hypertension    History of kidney stones    Hydronephrosis, right    Renal calculi    Type 2 diabetes mellitus (HCC)    Urge urinary incontinence    Wears glasses    SOCIAL HX:  Social History   Tobacco Use   Smoking status: Never   Smokeless tobacco: Never  Substance Use Topics   Alcohol use: No   FAMILY HX:  Family History  Problem Relation Age of Onset   Dementia Father    Anesthesia problems Neg Hx    Hypotension Neg Hx    Malignant hyperthermia Neg Hx    Pseudochol deficiency Neg Hx        Preferred Pharmacy: ALLERGIES:  Allergies  Allergen Reactions   Flomax [Tamsulosin Hcl] Swelling    JAWS   Sulfa Antibiotics Swelling     PERTINENT MEDICATIONS:  Outpatient Encounter Medications as of 12/28/2022  Medication Sig   acetaminophen (TYLENOL) 325 MG tablet Take 1-2 tablets (325-650 mg total) by mouth every 6 (six) hours as needed for mild pain or moderate pain. (Patient not taking: Reported on 10/29/2022)   aspirin EC 325 MG tablet Take 325 mg by mouth daily.   cholecalciferol  (VITAMIN D3) 25 MCG (1000 UNIT) tablet Take 2,000 Units by mouth daily.   docusate (COLACE) 50 MG/5ML liquid Take 100 mg by mouth 2 (two) times daily.   famotidine (PEPCID) 10 MG tablet Take 10 mg by mouth 2 (two) times daily.   fluticasone (FLONASE) 50 MCG/ACT nasal spray Place 2 sprays into both nostrils daily.   HYDROcodone-acetaminophen (NORCO/VICODIN) 5-325 MG tablet Take 1 tablet by mouth every 6 (six) hours as needed for severe pain. (Patient not taking: Reported on 10/29/2022)   montelukast (SINGULAIR) 10 MG tablet Take 10 mg by mouth at bedtime.   Nutritional Supplement LIQD Take 1 each by mouth 4 (four) times daily. Med Pass 4 times a day, Ensure between meals and ice cream with meals for weight loss   ondansetron (ZOFRAN-ODT) 4 MG disintegrating tablet Take 4 mg by mouth every 8 (eight) hours as needed. (Patient not taking: Reported on 10/29/2022)   vitamin B-12 (CYANOCOBALAMIN) 500 MCG tablet Take 500 mcg by mouth daily.   No  facility-administered encounter medications on file as of 12/28/2022.    History obtained from review of EMR, discussion with facility staff/caregiver and/or patient.    I reviewed available labs, medications, imaging, studies and related documents from the EMR.  There were no new records/imaging since last visit.   Physical Exam: GENERAL: NAD LUNGS: CTAB, no increased work of breathing, room air CARDIAC:  S1S2, RRR with no MRG, No edema/cyanosis ABD:  Normo-active BS x 4 quads, soft, non-tender EXTREMITIES: Normal ROM, no deformity, strength equal, Yes subcutaneous fat loss in all 4 extremities NEURO:  No weakness, noted cognitive impairment, significant aphasia-expressive/receptive with word salad PSYCH:  non-anxious affect, A & O x 3  Thank you for the opportunity to participate in the care of. Please Aaira Bodily call our main office at [phone] if we can be of additional assistance.    Joycelyn Man FNP-C  [facility]  Phone:  [phone]

## 2023-01-01 ENCOUNTER — Telehealth: Payer: Self-pay | Admitting: Family Medicine

## 2023-01-01 NOTE — Telephone Encounter (Signed)
TCT Pt's sister Marisue Ivan and left message for return call.  Asked to discuss family member's current health and recent visit by Palliative NP to the facility.  Need to investigate if family interested in possible Hospice Referral.  Joycelyn Man FNP-C

## 2023-09-04 ENCOUNTER — Ambulatory Visit: Payer: Medicare PPO | Admitting: Orthopedic Surgery

## 2023-09-04 ENCOUNTER — Other Ambulatory Visit (INDEPENDENT_AMBULATORY_CARE_PROVIDER_SITE_OTHER): Payer: Self-pay

## 2023-09-04 ENCOUNTER — Encounter: Payer: Self-pay | Admitting: Orthopedic Surgery

## 2023-09-04 VITALS — BP 113/65 | HR 98

## 2023-09-04 DIAGNOSIS — M79641 Pain in right hand: Secondary | ICD-10-CM

## 2023-09-04 NOTE — Progress Notes (Signed)
 New Patient Visit  Assessment: [name] is a 73 y.o. female with the following: 1. Pain in right hand  Plan: Camdynn B Schrader apparently has pain in the right hand.  She has dementia.  She lives in a nursing facility.  No family available clinic today.  Staff accompanying her to clinic visit not primarily involved with her care.  Radiographs demonstrate possible fracture of the proximal phalanx to the right small finger.  On physical exam, she has no swelling.  No bruising.  No tenderness.  She is able to make a fist.  Chronicity of injury unknown.  No immobilization is required.  Activities as tolerated.  She will follow-up as needed.  Follow-up: Return if symptoms worsen or fail to improve.  Subjective:  Chief Complaint  Patient presents with   Hand Pain    R hand     History of Present Illness: [name] is a 73 y.o. female who presents for evaluation of right hand pain.  She presents with a care worker from her nursing facility.  The care worker is not responsible for her care.  Patient is demented.  Does not answer questions.  Per reports, there was a possible fall, which required an x-ray.  She was subsequently referred to clinic for further evaluation.  No treatment was initiated.   Review of Systems: Unable to assess.   Medical History:  Past Medical History:  Diagnosis Date   Arthritis    fingers   Dementia (HCC)    Depression    GERD (gastroesophageal reflux disease)    Headache(784.0)    History of hypertension    NO MEDS NOW   History of kidney stones    Hydronephrosis, right    Hyperlipidemia    Renal calculi    LEFT   Type 2 diabetes mellitus (HCC)    Urge urinary incontinence    Wears glasses     Past Surgical History:  Procedure Laterality Date   ANTERIOR AND POSTERIOR REPAIR  07/13/2011   Procedure: ANTERIOR (CYSTOCELE) AND POSTERIOR REPAIR (RECTOCELE);  Surgeon: Bobie DELENA Cary;  Location: [facility];  Service: Gynecology;  Laterality: N/A;    BILIARY STENT PLACEMENT  08/22/2011   Procedure: BILIARY STENT PLACEMENT;  Surgeon: Norleen JAYSON Hint, MD;  Location: [facility];  Service: Endoscopy;  Laterality: N/A;  67f x 5cm stent for bile leak. placed in CBD   BLADDER SUSPENSION  07/13/2011   Procedure: TRANSVAGINAL TAPE (TVT) PROCEDURE;  Surgeon: Bobie DELENA Cary;  Location: [facility];  Service: Gynecology;  Laterality: N/A;   CYSTO/ RETROGRADE PYELOGRAM/ RIGHT URETERAL DILATION  10-28-2008   CYSTO/ RIGHT URETERAL BALLOON DILATION/ RIGHT URETEROSCOPIC STONE EXTRACTIO/ STENT  11-29-2010   CYSTO/ URETEROSCOPIC LASER LITHO OF STONES  2009  &  09-30-2010   CYSTOSCOPY  07/13/2011   Procedure: CYSTOSCOPY;  Surgeon: Bobie DELENA Cary;  Location: [facility];  Service: Gynecology;  Laterality: N/A;   CYSTOSCOPY/RETROGRADE/URETEROSCOPY/STONE EXTRACTION WITH BASKET Bilateral 05/05/2013   Procedure: CYSTOSCOPY/RETROGRADE/URETEROSCOPY/STONE EXTRACTION WITH BASKET   Procedure:Cysto, Right Retrograde Pyelogram, Left Ureteroscopy with Holmium Laser, Extraction of Stones;  Surgeon: Garnette Shack, MD;  Location: [facility];  Service: Urology;  Laterality: Bilateral;   ERCP  08/22/2011   Procedure: ENDOSCOPIC RETROGRADE CHOLANGIOPANCREATOGRAPHY (ERCP);  Surgeon: Norleen JAYSON Hint, MD;  Location: [facility] ENDOSCOPY;  Service: Endoscopy;  Laterality: N/A;   ERCP  12/15/2011   Procedure: ENDOSCOPIC RETROGRADE CHOLANGIOPANCREATOGRAPHY (ERCP);  Surgeon: Norleen JAYSON Hint, MD;  Location: [facility];  Service: Endoscopy;  Laterality: N/A;  stent removal/mod. sedation   EXTRACORPOREAL SHOCK WAVE LITHOTRIPSY Left 04-17-2011   HOLMIUM LASER APPLICATION Left 05/05/2013   Procedure: HOLMIUM LASER APPLICATION;  Surgeon: Garnette Shack, MD;  Location: [facility];  Service: Urology;  Laterality: Left;   INTRAMEDULLARY (IM) NAIL INTERTROCHANTERIC Right 07/02/2022   Procedure: INTRAMEDULLARY (IM) NAIL INTERTROCHANTERIC;  Surgeon: Kendal Franky SQUIBB, MD;  Location: [facility];   Service: Orthopedics;  Laterality: Right;   LAPAROSCOPIC ASSISTED VAGINAL HYSTERECTOMY  07/13/2011   Procedure: LAPAROSCOPIC ASSISTED VAGINAL HYSTERECTOMY;  Surgeon: Bobie DELENA Cary;  Location: [facility];  Service: Gynecology;  Laterality: N/A;   LAPAROSCOPIC CHOLECYSTECTOMY  08-17-2011   SALPINGOOPHORECTOMY  07/13/2011   Procedure: SALPINGO OOPHERECTOMY;  Surgeon: Bobie DELENA Cary;  Location: [facility];  Service: Gynecology;  Laterality: Bilateral;    Family History  Problem Relation Age of Onset   Dementia Father    Anesthesia problems Neg Hx    Hypotension Neg Hx    Malignant hyperthermia Neg Hx    Pseudochol deficiency Neg Hx    Social History   Tobacco Use   Smoking status: Never   Smokeless tobacco: Never  Substance Use Topics   Alcohol use: No   Drug use: No    Allergies  Allergen Reactions   Flomax [Tamsulosin Hcl] Swelling    JAWS   Sulfa Antibiotics Swelling    Current Meds  Medication Sig   aspirin  EC 325 MG tablet Take 325 mg by mouth daily.   cholecalciferol (VITAMIN D3) 25 MCG (1000 UNIT) tablet Take 2,000 Units by mouth daily.   docusate (COLACE) 50 MG/5ML liquid Take 100 mg by mouth 2 (two) times daily.   famotidine  (PEPCID ) 10 MG tablet Take 10 mg by mouth 2 (two) times daily.   fluticasone  (FLONASE ) 50 MCG/ACT nasal spray Place 2 sprays into both nostrils daily.   montelukast  (SINGULAIR ) 10 MG tablet Take 10 mg by mouth at bedtime.   Nutritional Supplement LIQD Take 1 each by mouth 4 (four) times daily. Med Pass 4 times a day, Ensure between meals and ice cream with meals for weight loss   vitamin B-12 (CYANOCOBALAMIN ) 500 MCG tablet Take 500 mcg by mouth daily.    Objective: BP 113/65   Pulse 98   Physical Exam:  General: No acute distress. and Seated in a wheelchair. Gait: Unable to ambulate.  Patient does not respond to questions appropriately.  She demonstrates dementia in clinic today.  Evaluation of the right hand demonstrates no swelling.  No  bruising.  No redness.  She is not reactive to palpation of the small finger.  She is able to make full fist.  She does react to light touch.  Fingers are warm and well-perfused.  IMAGING: I personally ordered and reviewed the following images   X-rays of the right hand were obtained in clinic today.  No acute injuries are noted.  Possible minimally displaced fracture of the proximal phalanx to the small finger.  There is no intra-articular involvement.  Chronicity is unknown.  Chronic deformity of the right index DIP joint, with some subluxation of the joint.  Diffuse degenerative changes.  Impression: Right hand x-ray with chronic deformity of index finger, and possible subacute fracture of the proximal phalanx of the small finger.   New Medications:  No orders of the defined types were placed in this encounter.     Oneil DELENA Horde, MD  09/04/2023 2:12 PM
